# Patient Record
Sex: Female | Born: 1950 | Race: White | Hispanic: No | Marital: Married | Smoking: Former smoker
Health system: Southern US, Community
[De-identification: ages and names within clinical notes are randomized; demographics above are authoritative.]

## PROBLEM LIST (undated history)

## (undated) DIAGNOSIS — F329 Major depressive disorder, single episode, unspecified: Secondary | ICD-10-CM

## (undated) DIAGNOSIS — E039 Hypothyroidism, unspecified: Secondary | ICD-10-CM

## (undated) DIAGNOSIS — J3089 Other allergic rhinitis: Secondary | ICD-10-CM

## (undated) DIAGNOSIS — K219 Gastro-esophageal reflux disease without esophagitis: Secondary | ICD-10-CM

## (undated) DIAGNOSIS — E559 Vitamin D deficiency, unspecified: Secondary | ICD-10-CM

## (undated) DIAGNOSIS — F32A Depression, unspecified: Secondary | ICD-10-CM

## (undated) DIAGNOSIS — F419 Anxiety disorder, unspecified: Secondary | ICD-10-CM

## (undated) DIAGNOSIS — B009 Herpesviral infection, unspecified: Secondary | ICD-10-CM

## (undated) DIAGNOSIS — A64 Unspecified sexually transmitted disease: Secondary | ICD-10-CM

## (undated) DIAGNOSIS — M8580 Other specified disorders of bone density and structure, unspecified site: Secondary | ICD-10-CM

## (undated) HISTORY — DX: Other allergic rhinitis: J30.89

## (undated) HISTORY — DX: Herpesviral infection, unspecified: B00.9

## (undated) HISTORY — PX: WISDOM TOOTH EXTRACTION: SHX21

## (undated) HISTORY — DX: Other specified disorders of bone density and structure, unspecified site: M85.80

## (undated) HISTORY — PX: AUGMENTATION MAMMAPLASTY: SUR837

## (undated) HISTORY — DX: Depression, unspecified: F32.A

## (undated) HISTORY — DX: Anxiety disorder, unspecified: F41.9

## (undated) HISTORY — DX: Hypothyroidism, unspecified: E03.9

## (undated) HISTORY — DX: Gastro-esophageal reflux disease without esophagitis: K21.9

## (undated) HISTORY — DX: Vitamin D deficiency, unspecified: E55.9

## (undated) HISTORY — DX: Unspecified sexually transmitted disease: A64

## (undated) HISTORY — DX: Major depressive disorder, single episode, unspecified: F32.9

## (undated) HISTORY — PX: COLONOSCOPY: SHX174

---

## 1994-03-06 HISTORY — PX: BREAST ENHANCEMENT SURGERY: SHX7

## 1997-10-22 ENCOUNTER — Other Ambulatory Visit: Admission: RE | Admit: 1997-10-22 | Discharge: 1997-10-22 | Payer: Self-pay | Admitting: Obstetrics and Gynecology

## 1999-04-29 ENCOUNTER — Other Ambulatory Visit: Admission: RE | Admit: 1999-04-29 | Discharge: 1999-04-29 | Payer: Self-pay | Admitting: Obstetrics and Gynecology

## 2000-07-06 ENCOUNTER — Other Ambulatory Visit: Admission: RE | Admit: 2000-07-06 | Discharge: 2000-07-06 | Payer: Self-pay | Admitting: Obstetrics and Gynecology

## 2001-09-11 ENCOUNTER — Other Ambulatory Visit: Admission: RE | Admit: 2001-09-11 | Discharge: 2001-09-11 | Payer: Self-pay | Admitting: Obstetrics and Gynecology

## 2002-07-28 ENCOUNTER — Encounter: Admission: RE | Admit: 2002-07-28 | Discharge: 2002-07-28 | Payer: Self-pay | Admitting: Internal Medicine

## 2002-07-28 ENCOUNTER — Encounter: Payer: Self-pay | Admitting: Internal Medicine

## 2002-11-25 ENCOUNTER — Other Ambulatory Visit: Admission: RE | Admit: 2002-11-25 | Discharge: 2002-11-25 | Payer: Self-pay | Admitting: Obstetrics and Gynecology

## 2004-06-28 ENCOUNTER — Other Ambulatory Visit: Admission: RE | Admit: 2004-06-28 | Discharge: 2004-06-28 | Payer: Self-pay | Admitting: Obstetrics and Gynecology

## 2004-10-12 ENCOUNTER — Ambulatory Visit: Payer: Self-pay | Admitting: Internal Medicine

## 2005-02-06 ENCOUNTER — Ambulatory Visit: Payer: Self-pay | Admitting: Internal Medicine

## 2005-07-11 ENCOUNTER — Ambulatory Visit: Payer: Self-pay | Admitting: Internal Medicine

## 2005-07-20 ENCOUNTER — Ambulatory Visit: Payer: Self-pay | Admitting: Internal Medicine

## 2005-07-20 LAB — HM COLONOSCOPY

## 2005-08-03 ENCOUNTER — Other Ambulatory Visit: Admission: RE | Admit: 2005-08-03 | Discharge: 2005-08-03 | Payer: Self-pay | Admitting: Obstetrics & Gynecology

## 2006-08-01 ENCOUNTER — Encounter: Payer: Self-pay | Admitting: Internal Medicine

## 2006-09-04 ENCOUNTER — Other Ambulatory Visit: Admission: RE | Admit: 2006-09-04 | Discharge: 2006-09-04 | Payer: Self-pay | Admitting: Obstetrics & Gynecology

## 2007-01-29 ENCOUNTER — Encounter: Payer: Self-pay | Admitting: Internal Medicine

## 2007-02-15 ENCOUNTER — Encounter: Payer: Self-pay | Admitting: Internal Medicine

## 2007-06-28 ENCOUNTER — Encounter: Payer: Self-pay | Admitting: Internal Medicine

## 2007-10-16 ENCOUNTER — Other Ambulatory Visit: Admission: RE | Admit: 2007-10-16 | Discharge: 2007-10-16 | Payer: Self-pay | Admitting: Obstetrics & Gynecology

## 2007-12-11 ENCOUNTER — Encounter: Payer: Self-pay | Admitting: Internal Medicine

## 2008-06-08 ENCOUNTER — Encounter: Payer: Self-pay | Admitting: Internal Medicine

## 2008-11-17 ENCOUNTER — Encounter: Payer: Self-pay | Admitting: Internal Medicine

## 2008-11-19 ENCOUNTER — Ambulatory Visit: Payer: Self-pay | Admitting: Vascular Surgery

## 2008-11-28 ENCOUNTER — Observation Stay (HOSPITAL_COMMUNITY): Admission: EM | Admit: 2008-11-28 | Discharge: 2008-11-29 | Payer: Self-pay | Admitting: Emergency Medicine

## 2008-11-28 ENCOUNTER — Encounter (INDEPENDENT_AMBULATORY_CARE_PROVIDER_SITE_OTHER): Payer: Self-pay | Admitting: General Surgery

## 2009-02-24 ENCOUNTER — Encounter: Payer: Self-pay | Admitting: Internal Medicine

## 2009-03-06 HISTORY — PX: APPENDECTOMY: SHX54

## 2009-08-18 ENCOUNTER — Encounter: Payer: Self-pay | Admitting: Internal Medicine

## 2009-11-30 ENCOUNTER — Ambulatory Visit: Payer: Self-pay | Admitting: Internal Medicine

## 2009-11-30 DIAGNOSIS — E039 Hypothyroidism, unspecified: Secondary | ICD-10-CM

## 2009-11-30 DIAGNOSIS — M8580 Other specified disorders of bone density and structure, unspecified site: Secondary | ICD-10-CM

## 2009-11-30 DIAGNOSIS — J309 Allergic rhinitis, unspecified: Secondary | ICD-10-CM

## 2009-11-30 DIAGNOSIS — E559 Vitamin D deficiency, unspecified: Secondary | ICD-10-CM | POA: Insufficient documentation

## 2009-11-30 LAB — CONVERTED CEMR LAB
ALT: 16 units/L (ref 0–35)
BUN: 17 mg/dL (ref 6–23)
Bilirubin, Direct: 0.2 mg/dL (ref 0.0–0.3)
CO2: 28 meq/L (ref 19–32)
Chloride: 108 meq/L (ref 96–112)
Cholesterol: 195 mg/dL (ref 0–200)
Creatinine, Ser: 0.9 mg/dL (ref 0.4–1.2)
Eosinophils Absolute: 0.1 10*3/uL (ref 0.0–0.7)
Glucose, Bld: 82 mg/dL (ref 70–99)
HCT: 40.7 % (ref 36.0–46.0)
LDL Cholesterol: 113 mg/dL — ABNORMAL HIGH (ref 0–99)
Lymphs Abs: 1.8 10*3/uL (ref 0.7–4.0)
MCHC: 34.8 g/dL (ref 30.0–36.0)
MCV: 92 fL (ref 78.0–100.0)
Monocytes Absolute: 0.4 10*3/uL (ref 0.1–1.0)
Neutrophils Relative %: 52.7 % (ref 43.0–77.0)
Platelets: 224 10*3/uL (ref 150.0–400.0)
Potassium: 4.2 meq/L (ref 3.5–5.1)
RDW: 12.4 % (ref 11.5–14.6)
TSH: 2.23 microintl units/mL (ref 0.35–5.50)
Total Bilirubin: 1.5 mg/dL — ABNORMAL HIGH (ref 0.3–1.2)
Total Protein: 7.3 g/dL (ref 6.0–8.3)
Triglycerides: 50 mg/dL (ref 0.0–149.0)

## 2009-12-01 LAB — CONVERTED CEMR LAB: Vit D, 25-Hydroxy: 44 ng/mL (ref 30–89)

## 2010-02-07 ENCOUNTER — Encounter: Payer: Self-pay | Admitting: Internal Medicine

## 2010-02-16 ENCOUNTER — Encounter: Payer: Self-pay | Admitting: Internal Medicine

## 2010-03-08 ENCOUNTER — Encounter: Payer: Self-pay | Admitting: Internal Medicine

## 2010-04-05 NOTE — Assessment & Plan Note (Signed)
Summary: cpx//pt will be fasting//lch   Vital Signs:  Patient profile:   60 year old female Height:      66.25 inches Weight:      128.8 pounds BMI:     20.71 Temp:     98.1 degrees F oral Pulse rate:   64 / minute Resp:     14 per minute BP sitting:   100 / 64  (left arm) Cuff size:   regular  Vitals Entered By: Shonna Chock CMA (November 30, 2009 9:40 AM)  Comments Patient will hold off on vaccine/immunization update due to current allergies   History of Present Illness: Rachel Knight is here for a physical; she is asymptomatic except for extrinsic rhintis for which she took Allergy shots from Dr Gary Callas.  Preventive Screening-Counseling & Management  Alcohol-Tobacco     Smoking Status: quit  Caffeine-Diet-Exercise     Does Patient Exercise: yes  Current Medications (verified): 1)  Multivitamins  Tabs (Multiple Vitamin) .Marland Kitchen.. 1 By Mouth Once Daily 2)  Allerex-Df .... 1/2 By Mouth As Needed 3)  Calcium 800-1000mg  .... Daily 4)  Vitamin D (Ergocalciferol) 50000 Unit Caps (Ergocalciferol) .... 2 X Monthly 5)  Levoxyl 75 Mcg Tabs (Levothyroxine Sodium) .Marland Kitchen.. 1 By Mouth Once Daily 6)  Omnaris 50 Mcg/act Susp (Ciclesonide) .... As Needed  Allergies (verified): No Known Drug Allergies  Past History:  Past Medical History: Vitamin D deficiency , Dr Leda Quail ,Gyn Allergic rhinitis Hypothyroidism Post Menopausal Osteopenia Vasovagal Syncope, PMH of  Past Surgical History: Breast Implants 1996; G 2 P 2; Colonoscopy 2000, 2007 : negative , Dr Juanda Chance( due for repeat 2017) Appendectomy 2010  Family History: Father: CVA, colon polyps;PGF : colon cancer Mother: breast cancer(local) Siblings: bro: CVA @ 85;   Social History: Occupation:Artist Married Former Smoker: < 1 year in teens Alcohol use-yes: socially Regular exercise-yes: walking, gym  5X/week Smoking Status:  quit Does Patient Exercise:  yes  Review of Systems  The patient denies anorexia, fever,  vision loss, decreased hearing, hoarseness, chest pain, syncope, dyspnea on exertion, peripheral edema, prolonged cough, headaches, abdominal pain, melena, hematochezia, severe indigestion/heartburn, hematuria, suspicious skin lesions, depression, unusual weight change, abnormal bleeding, enlarged lymph nodes, and angioedema.         Weight up 5# post menopause. ENT:  Complains of nasal congestion; denies postnasal drainage. Allergy:  Denies hives or rash and itching eyes; Perennial symptoms.  Physical Exam  General:  Thin,well-nourished; alert,appropriate and cooperative throughout examination Head:  Normocephalic and atraumatic without obvious abnormalities.  Eyes:  No corneal or conjunctival inflammation noted. Perrla. Funduscopic exam benign, without hemorrhages, exudates or papilledema.  Ears:  External ear exam shows no significant lesions or deformities.  Otoscopic examination reveals clear canals, tympanic membranes are intact bilaterally without bulging, retraction, inflammation or discharge. Hearing is grossly normal bilaterally. Nose:  External nasal examination shows no deformity or inflammation. Nasal mucosa are pink and moist without lesions or exudates. Mouth:  Oral mucosa and oropharynx without lesions or exudates.  Teeth in good repair. Neck:  No deformities, masses, or tenderness noted. Lungs:  Normal respiratory effort, chest expands symmetrically. Lungs are clear to auscultation, no crackles or wheezes. Heart:  regular rhythm, no murmur, no gallop, no rub, no JVD, no HJR, and bradycardia.   Abdomen:  Bowel sounds positive,abdomen soft and non-tender without masses, organomegaly or hernias noted. Faint aortic bruit w/o AAA Genitalia:  Dr Hyacinth Meeker Msk:  No deformity or scoliosis noted of thoracic or lumbar spine.  Pulses:  R and L carotid,radial,dorsalis pedis and posterior tibial pulses are full and equal bilaterally Extremities:  No clubbing, cyanosis, edema, or deformity  noted with normal full range of motion of all joints.   Neurologic:  alert & oriented X3 and DTRs symmetrical and normal.   Skin:  Intact without suspicious lesions or rashes Cervical Nodes:  No lymphadenopathy noted Axillary Nodes:  No palpable lymphadenopathy Psych:  memory intact for recent and remote, normally interactive, and good eye contact.     Impression & Recommendations:  Problem # 1:  ROUTINE GENERAL MEDICAL EXAM@HEALTH  CARE FACL (ICD-V70.0)  Orders: EKG w/ Interpretation (93000) Venipuncture (47425) TLB-Lipid Panel (80061-LIPID) TLB-BMP (Basic Metabolic Panel-BMET) (80048-METABOL) TLB-CBC Platelet - w/Differential (85025-CBCD) TLB-Hepatic/Liver Function Pnl (80076-HEPATIC) TLB-TSH (Thyroid Stimulating Hormone) (84443-TSH) T-Vitamin D (25-Hydroxy) (95638-75643) Specimen Handling (32951)  Problem # 2:  HYPOTHYROIDISM (ICD-244.9)  Her updated medication list for this problem includes:    Levoxyl 75 Mcg Tabs (Levothyroxine sodium) .Marland Kitchen... 1 by mouth once daily  Problem # 3:  OSTEOPENIA (ICD-733.90)  Problem # 4:  VITAMIN D DEFICIENCY (ICD-268.9)  Problem # 5:  ALLERGIC RHINITIS (ICD-477.9)  Her updated medication list for this problem includes:    Omnaris 50 Mcg/act Susp (Ciclesonide) .Marland Kitchen... As needed  Complete Medication List: 1)  Multivitamins Tabs (Multiple vitamin) .Marland Kitchen.. 1 by mouth once daily 2)  Allerex-df  .... 1/2 by mouth as needed 3)  Calcium 800-1000mg   .... Daily 4)  Vitamin D (ergocalciferol) 50000 Unit Caps (Ergocalciferol) .... 2 x monthly 5)  Levoxyl 75 Mcg Tabs (Levothyroxine sodium) .Marland Kitchen.. 1 by mouth once daily 6)  Omnaris 50 Mcg/act Susp (Ciclesonide) .... As needed  Patient Instructions: 1)  Neti pot once daily as needed for nasal congestion followed by Omnaris.

## 2010-04-05 NOTE — Letter (Signed)
[facility]   Imported By: Lennie Odor 08/31/2009 14:38:04  _____________________________________________________________________  External Attachment:    Type:   Image     Comment:   External Document

## 2010-04-05 NOTE — Letter (Signed)
Summary: [facility]   All[facility] & [facility]   Imported By: Lanelle Bal 04/05/2009 11:43:46  _____________________________________________________________________  External Attachment:    Type:   Image     Comment:   External Document

## 2010-04-07 NOTE — Miscellaneous (Signed)
Summary: [facility]  [facility]   Imported By: Lanelle Bal 02/14/2010 09:38:51  _____________________________________________________________________  External Attachment:    Type:   Image     Comment:   External Document

## 2010-06-10 LAB — DIFFERENTIAL
Basophils Absolute: 0 10*3/uL (ref 0.0–0.1)
Basophils Relative: 0 % (ref 0–1)
Eosinophils Absolute: 0 10*3/uL (ref 0.0–0.7)
Eosinophils Relative: 0 % (ref 0–5)

## 2010-06-10 LAB — COMPREHENSIVE METABOLIC PANEL
ALT: 15 U/L (ref 0–35)
AST: 20 U/L (ref 0–37)
Alkaline Phosphatase: 50 U/L (ref 39–117)
CO2: 20 mEq/L (ref 19–32)
Chloride: 106 mEq/L (ref 96–112)
Creatinine, Ser: 0.93 mg/dL (ref 0.4–1.2)
GFR calc Af Amer: >60 mL/min (ref >60)
GFR calc non Af Amer: >60 mL/min (ref >60)
Potassium: 3.3 mEq/L — ABNORMAL LOW (ref 3.5–5.1)
Sodium: 136 mEq/L (ref 135–145)
Total Bilirubin: 1.2 mg/dL (ref 0.3–1.2)

## 2010-06-10 LAB — CBC
MCV: 92.1 fL (ref 78.0–100.0)
RBC: 4.22 MIL/uL (ref 3.87–5.11)
WBC: 14.4 10*3/uL — ABNORMAL HIGH (ref 4.0–10.5)

## 2010-06-10 LAB — URINE MICROSCOPIC-ADD ON

## 2010-06-10 LAB — URINALYSIS, ROUTINE W REFLEX MICROSCOPIC
Glucose, UA: NEGATIVE mg/dL
Leukocytes, UA: NEGATIVE
Protein, ur: NEGATIVE mg/dL
Specific Gravity, Urine: 1.03 (ref 1.005–1.030)
Urobilinogen, UA: 0.2 mg/dL (ref 0.0–1.0)

## 2010-06-10 LAB — LIPASE, BLOOD: Lipase: 42 U/L (ref 11–59)

## 2011-01-05 LAB — HM PAP SMEAR

## 2011-02-15 ENCOUNTER — Encounter: Payer: Self-pay | Admitting: Family

## 2011-02-15 ENCOUNTER — Ambulatory Visit (INDEPENDENT_AMBULATORY_CARE_PROVIDER_SITE_OTHER): Payer: 59 | Admitting: Family

## 2011-02-15 VITALS — BP 100/72 | HR 72 | Temp 98.0°F | Resp 16 | Wt 126.0 lb

## 2011-02-15 DIAGNOSIS — R05 Cough: Secondary | ICD-10-CM

## 2011-02-15 DIAGNOSIS — J069 Acute upper respiratory infection, unspecified: Secondary | ICD-10-CM | POA: Insufficient documentation

## 2011-02-15 NOTE — Progress Notes (Signed)
  Subjective:    Patient ID: Rachel Knight, female    DOB: 04/19/1950, 60 y.o.   MRN: [id number]  HPI  Rachel Knight is a 59 yr old female who presents today with chief complaint of low grade temp x3 days Tmax is 99.9.  Symptoms started "like a cold" on Sunday night.  Gradually worsening, "horrible HA" and mucous coming out of my nose.  Coughing up phlegm since yesterday which is clear.  Symptoms are worsening. She has tried alkaselzer day/night, sudafed, and benadryl/advil without significant improvement.  She had a flu shot 2 weeks ago.      Review of Systems Pmhx is significant for hypothyroidism, depression, osteopenia and vitamin D deficiency.    Objective:   Physical Exam  Constitutional: She is oriented to person, place, and time. She appears well-developed and well-nourished. No distress.  HENT:  Right Ear: Tympanic membrane and ear canal normal.  Left Ear: Tympanic membrane and ear canal normal.  Mouth/Throat: No posterior oropharyngeal edema or posterior oropharyngeal erythema.  Eyes: Conjunctivae are normal. No scleral icterus.  Cardiovascular: Normal rate and regular rhythm.   No murmur heard. Pulmonary/Chest: Effort normal and breath sounds normal. No respiratory distress. She has no wheezes. She has no rales. She exhibits no tenderness.  Musculoskeletal: She exhibits no edema.  Neurological: She is alert and oriented to person, place, and time.  Skin: Skin is warm and dry. No rash noted. No erythema. No pallor.  Psychiatric: She has a normal mood and affect. Her behavior is normal. Judgment and thought content normal.          Assessment & Plan:

## 2011-02-15 NOTE — Assessment & Plan Note (Addendum)
Rapid flu is negative.  Will plan to treat conservatively.  Recommended prn motrin/sudafed.  Call if symptoms are not improved in 3 days.

## 2011-02-15 NOTE — Patient Instructions (Signed)

## 2011-02-16 ENCOUNTER — Ambulatory Visit: Payer: Self-pay | Admitting: Internal Medicine

## 2011-03-14 ENCOUNTER — Encounter: Payer: Self-pay | Admitting: Internal Medicine

## 2011-03-15 ENCOUNTER — Ambulatory Visit (INDEPENDENT_AMBULATORY_CARE_PROVIDER_SITE_OTHER): Payer: 59 | Admitting: Internal Medicine

## 2011-03-15 ENCOUNTER — Encounter: Payer: Self-pay | Admitting: Internal Medicine

## 2011-03-15 VITALS — BP 104/68 | HR 66 | Temp 98.2°F | Resp 12 | Ht 66.25 in | Wt 125.4 lb

## 2011-03-15 DIAGNOSIS — M899 Disorder of bone, unspecified: Secondary | ICD-10-CM

## 2011-03-15 DIAGNOSIS — E559 Vitamin D deficiency, unspecified: Secondary | ICD-10-CM

## 2011-03-15 DIAGNOSIS — Z0000 Encounter for general adult medical examination without abnormal findings: Secondary | ICD-10-CM

## 2011-03-15 DIAGNOSIS — E039 Hypothyroidism, unspecified: Secondary | ICD-10-CM

## 2011-03-15 DIAGNOSIS — M949 Disorder of cartilage, unspecified: Secondary | ICD-10-CM

## 2011-03-15 LAB — CBC WITH DIFFERENTIAL/PLATELET
Basophils Absolute: 0 10*3/uL (ref 0.0–0.1)
Eosinophils Relative: 2.1 % (ref 0.0–5.0)
Lymphocytes Relative: 36.3 % (ref 12.0–46.0)
Monocytes Relative: 8.2 % (ref 3.0–12.0)
Platelets: 216 10*3/uL (ref 150.0–400.0)
RDW: 12.7 % (ref 11.5–14.6)
WBC: 5.1 10*3/uL (ref 4.5–10.5)

## 2011-03-15 LAB — TSH: TSH: 1.65 u[IU]/mL (ref 0.35–5.50)

## 2011-03-15 LAB — BASIC METABOLIC PANEL
BUN: 21 mg/dL (ref 6–23)
Calcium: 9.7 mg/dL (ref 8.4–10.5)
GFR: 91.14 mL/min (ref >60.00)
Glucose, Bld: 83 mg/dL (ref 70–99)
Sodium: 142 mEq/L (ref 135–145)

## 2011-03-15 LAB — HEPATIC FUNCTION PANEL
ALT: 21 U/L (ref 0–35)
Albumin: 4.5 g/dL (ref 3.5–5.2)
Bilirubin, Direct: 0.1 mg/dL (ref 0.0–0.3)
Total Protein: 7.1 g/dL (ref 6.0–8.3)

## 2011-03-15 LAB — LIPID PANEL
Cholesterol: 179 mg/dL (ref 0–200)
HDL: 77 mg/dL (ref >39.00)
Triglycerides: 37 mg/dL (ref 0.0–149.0)
VLDL: 7.4 mg/dL (ref 0.0–40.0)

## 2011-03-15 MED ORDER — MONTELUKAST SODIUM 10 MG PO TABS: 10.0000 mg | ORAL_TABLET | Freq: Every day | ORAL | Status: DC

## 2011-03-15 MED ORDER — FLUTICASONE PROPIONATE 50 MCG/ACT NA SUSP: 1.0000 | Freq: Two times a day (BID) | NASAL | Status: DC | PRN

## 2011-03-15 NOTE — Progress Notes (Signed)
Subjective:    Patient ID: Rachel Knight, female    DOB: October 29, 1950, 61 y.o.   MRN: [id number]  HPI  Leotis Shames  is here for a physical;acute issues perennial allergies. Allergy shots actually made symptoms worse. Symptoms improve when she does not drink alcohol. She does plan a trial of acupuncture for her allergies.       Review of Systems Patient reports no significant  vision/ hearing  changes, adenopathy,fever, weight change,  persistant / recurrent hoarseness , swallowing issues, chest pain,palpitations,edema,persistant /recurrent cough, hemoptysis, dyspnea( rest/ exertional/paroxysmal nocturnal), gastrointestinal bleeding(melena, rectal bleeding), abdominal pain, significant heartburn,  bowel changes,GU symptoms(dysuria, hematuria,pyuria, incontinence), Gyn symptoms(abnormal  bleeding , pain),  syncope, focal weakness, memory loss,numbness & tingling, skin/hair /nail changes,abnormal bruising or bleeding, anxiety,or depression.  She is seeing a chiropractor with good response for some low back pain, hip pain and medial thigh strain.  She has had a "freckle" of the right iris; this is monitored annually.  She'll be seeing the dermatologist to assess her multiple nevi.  She only has palpitations when she takes cold preparation; she has used Sudafed for rhinitis symptoms.      Objective:   Physical Exam Gen.: Thin but healthy and well-nourished in appearance. Alert, appropriate and cooperative throughout exam. Head: Normocephalic without obvious abnormalities  Eyes: No corneal or conjunctival inflammation noted. Pupils equal round reactive to light and accommodation. Fundal exam is benign without hemorrhages, exudate, papilledema. Extraocular motion intact. Vision grossly normal. Hyperpigmentation of the knee medial right pupil area Ears: External  ear exam reveals no significant lesions or deformities. Canals clear .TMs normal. Hearing is grossly normal bilaterally. Nose: External nasal  exam reveals no deformity or inflammation. Nasal mucosa are pink and moist. No lesions or exudates noted.  Mouth: Oral mucosa and oropharynx reveal no lesions or exudates. Teeth in good repair. Neck: No deformities, masses, or tenderness noted. Range of motion & Thyroid normal. Possible cervical rib on the left. Lungs: Normal respiratory effort; chest expands symmetrically. Lungs are clear to auscultation without rales, wheezes, or increased work of breathing. Heart: Normal rate and rhythm. Normal S1 and S2. No gallop, or rub. Possible subtle click at the apex without regurgitation or murmur. Abdomen: Bowel sounds normal; abdomen soft and nontender. No masses, organomegaly or hernias noted. Genitalia:Dr Hyacinth Meeker   .                                                                                   Musculoskeletal/extremities: No deformity or scoliosis noted of  the thoracic or lumbar spine. No clubbing, cyanosis, edema, or deformity noted. Range of motion  normal .Tone & strength  normal.Joints normal. Nail health  good. Vascular: Carotid, radial artery, dorsalis pedis and  posterior tibial pulses are full and equal. No bruits present. Neurologic: Alert and oriented x3. Deep tendon reflexes symmetrical and normal.          Skin: Intact without suspicious lesions or rashes. Lymph: No cervical, axillary lymphadenopathy present. Psych: Mood and affect are normal. Normally interactive  Assessment & Plan:  #1 comprehensive physical exam; no acute findings #2 see Problem List with Assessments & Recommendations Plan: see Orders

## 2011-03-15 NOTE — Assessment & Plan Note (Signed)
Bone mineral density studies are performed at [facility])

## 2011-03-15 NOTE — Assessment & Plan Note (Signed)
Thyroid supplementation as prescribed by Dr. Hyacinth Meeker.

## 2011-03-15 NOTE — Assessment & Plan Note (Signed)
Vitamin D level is monitored by Dr. Hyacinth Meeker.

## 2011-03-15 NOTE — Patient Instructions (Signed)
Preventive Health Care: Exercise  30-45  minutes a day, 3-4 days a week. Walking is especially valuable in preventing Osteoporosis. Eat a low-fat diet with lots of fruits and vegetables, up to 7-9 servings per day.Consume less than 30 grams of sugar per day from foods & drinks with High Fructose Corn Syrup as # 1,2,3 or #4 on label.  Normal T scores on a bone density exam (BMD)  are +1 to -1. Osteopenia would be -1.1 to -2.4. Osteoporosis is defined by a  T score worse than -2.4. Treatment should be considered  with  T scores worse than -1.5, particularly if there is  family history of low bone density or personal  past history of atypical fractue.BMD should be monitored every 25 months. Recommended lifestyle interventions to prevent  Osteoporosis include calcium 600 mg twice a day  & vitamin D3 supplementation to keep vitamin  D  level @ least 40-60. The usual vitamin D3 dose is 1000 IU daily; but individual dose is determined by annual vitamin D level monitor.   As per the Standard of Care , screening Colonoscopy recommended @ 50 & every 5-10 years thereafter . More frequent monitor would be dictated by family history or findings @ Colonoscopy

## 2011-03-16 LAB — VITAMIN D 25 HYDROXY (VIT D DEFICIENCY, FRACTURES): Vit D, 25-Hydroxy: 43 ng/mL (ref 30–89)

## 2011-03-21 LAB — HM DEXA SCAN

## 2011-03-28 ENCOUNTER — Encounter: Payer: Self-pay | Admitting: Internal Medicine

## 2011-04-05 ENCOUNTER — Encounter: Payer: Self-pay | Admitting: Internal Medicine

## 2011-05-05 ENCOUNTER — Other Ambulatory Visit: Payer: Self-pay | Admitting: Orthopedic Surgery

## 2011-05-05 DIAGNOSIS — M25559 Pain in unspecified hip: Secondary | ICD-10-CM

## 2011-05-15 ENCOUNTER — Ambulatory Visit
Admission: RE | Admit: 2011-05-15 | Discharge: 2011-05-15 | Disposition: A | Discharge status: Home / Self Care | Payer: 59 | Source: Ambulatory Visit | Attending: Orthopedic Surgery | Admitting: Orthopedic Surgery

## 2011-05-15 DIAGNOSIS — M25559 Pain in unspecified hip: Secondary | ICD-10-CM

## 2011-05-15 MED ORDER — IOHEXOL 180 MG/ML  SOLN
15.0000 mL | Freq: Once | INTRAMUSCULAR | Status: AC | PRN
  Administered 2011-05-15: 15 mL via INTRA_ARTICULAR

## 2011-06-07 ENCOUNTER — Other Ambulatory Visit: Payer: Self-pay | Admitting: Dermatology

## 2011-06-17 ENCOUNTER — Emergency Department (HOSPITAL_COMMUNITY)
Admission: EM | Admit: 2011-06-17 | Discharge: 2011-06-17 | Disposition: A | Discharge status: Home / Self Care | Payer: 59 | Attending: Emergency Medicine | Admitting: Emergency Medicine

## 2011-06-17 ENCOUNTER — Encounter (HOSPITAL_COMMUNITY): Payer: Self-pay

## 2011-06-17 DIAGNOSIS — F411 Generalized anxiety disorder: Secondary | ICD-10-CM | POA: Insufficient documentation

## 2011-06-17 DIAGNOSIS — F419 Anxiety disorder, unspecified: Secondary | ICD-10-CM

## 2011-06-17 DIAGNOSIS — R002 Palpitations: Secondary | ICD-10-CM | POA: Insufficient documentation

## 2011-06-17 DIAGNOSIS — I498 Other specified cardiac arrhythmias: Secondary | ICD-10-CM | POA: Insufficient documentation

## 2011-06-17 LAB — BASIC METABOLIC PANEL
CO2: 23 mEq/L (ref 19–32)
Calcium: 9.4 mg/dL (ref 8.4–10.5)
Creatinine, Ser: 0.84 mg/dL (ref 0.50–1.10)
GFR calc non Af Amer: 75 mL/min — ABNORMAL LOW (ref >90)

## 2011-06-17 LAB — TSH: TSH: 2.942 u[IU]/mL (ref 0.350–4.500)

## 2011-06-17 MED ORDER — ALPRAZOLAM 0.5 MG PO TABS
0.5000 mg | ORAL_TABLET | Freq: Once | ORAL | Status: AC
  Administered 2011-06-17: 0.5 mg via ORAL
  Filled 2011-06-17: qty 1

## 2011-06-17 MED ORDER — ALPRAZOLAM 0.5 MG PO TABS: 0.5000 mg | ORAL_TABLET | Freq: Three times a day (TID) | ORAL | Status: DC | PRN

## 2011-06-17 NOTE — Discharge Instructions (Signed)
Follow up with primary care doctor in coming week. Avoid caffeine. You may take xanax as need for anxiety - no driving for the next 6 hours or when taking xanax. The lab work that has resulted is normal. Your thyroid tests remain pending, and will be resulted tomorrow - have your doctor follow up on those results. Return to ER if wore, persistent fast heart beat, fast pulse rate, faint, severe anxiety, other concern.     Anxiety and Panic Attacks Your caregiver has informed you that you are having an anxiety or panic attack. There may be many forms of this. Most of the time these attacks come suddenly and without warning. They come at any time of day, including periods of sleep, and at any time of life. They may be strong and unexplained. Although panic attacks are very scary, they are physically harmless. Sometimes the cause of your anxiety is not known. Anxiety is a protective mechanism of the body in its fight or flight mechanism. Most of these perceived danger situations are actually nonphysical situations (such as anxiety over losing a job). CAUSES  The causes of an anxiety or panic attack are many. Panic attacks may occur in otherwise healthy people given a certain set of circumstances. There may be a genetic cause for panic attacks. Some medications may also have anxiety as a side effect. SYMPTOMS  Some of the most common feelings are:  Intense terror.   Dizziness, feeling faint.   Hot and cold flashes.   Fear of going crazy.   Feelings that nothing is real.   Sweating.   Shaking.   Chest pain or a fast heartbeat (palpitations).   Smothering, choking sensations.   Feelings of impending doom and that death is near.   Tingling of extremities, this may be from over-breathing.   Altered reality (derealization).   Being detached from yourself (depersonalization).  Several symptoms can be present to make up anxiety or panic attacks. DIAGNOSIS  The evaluation by your caregiver  will depend on the type of symptoms you are experiencing. The diagnosis of anxiety or panic attack is made when no physical illness can be determined to be a cause of the symptoms. TREATMENT  Treatment to prevent anxiety and panic attacks may include:  Avoidance of circumstances that cause anxiety.   Reassurance and relaxation.   Regular exercise.   Relaxation therapies, such as yoga.   Psychotherapy with a psychiatrist or therapist.   Avoidance of caffeine, alcohol and illegal drugs.   Prescribed medication.  SEEK IMMEDIATE MEDICAL CARE IF:   You experience panic attack symptoms that are different than your usual symptoms.   You have any worsening or concerning symptoms.  Document Released: 02/20/2005 Document Revised: 02/09/2011 Document Reviewed: 06/24/2009 [facility] Patient Information 2012 [place], [place].     Palpitations  A palpitation is the feeling that your heartbeat is irregular or is faster than normal. Although this is frightening, it usually is not serious. Palpitations may be caused by excesses of smoking, caffeine, or alcohol. They are also brought on by stress and anxiety. Sometimes, they are caused by heart disease. Unless otherwise noted, your caregiver did not find any signs of serious illness at this time. HOME CARE INSTRUCTIONS  To help prevent palpitations:  Drink decaffeinated coffee, tea, and soda pop. Avoid chocolate.   If you smoke or drink alcohol, quit or cut down as much as possible.   Reduce your stress or anxiety level. Biofeedback, yoga, or meditation will help you relax. Physical  activity such as swimming, jogging, or walking also may be helpful.  SEEK MEDICAL CARE IF:   You continue to have a fast heartbeat.   Your palpitations occur more often.  SEEK IMMEDIATE MEDICAL CARE IF: You develop chest pain, shortness of breath, severe headache, dizziness, or fainting. Document Released: 02/18/2000 Document Revised: 02/09/2011 Document Reviewed:  04/19/2007 [facility] Patient Information 2012 [place], [place].

## 2011-06-17 NOTE — ED Notes (Signed)
Pt in from home via ems with anxiety states out of medication denies s/i denies h/i

## 2011-06-17 NOTE — ED Provider Notes (Signed)
History     CSN: [id number]  Arrival date & time 06/17/11  1431   First MD Initiated Contact with Patient 06/17/11 1503      Chief Complaint  Patient presents with  . Anxiety    (Consider location/radiation/quality/duration/timing/severity/associated sxs/prior treatment) Patient is a 61 y.o. female presenting with anxiety.  Anxiety Pertinent negatives include no chest pain, no headaches and no shortness of breath.  pt states has hx anxiety, states in past couple weeks increased feelings of anxiety. States lots of recent stressors involving work and family issues. States feels as if palpitations, racing heart, anxiety, anxiousness. Denies hx dysrhythmia or cardiac disease. No chest pain. No unusual doe or fatigue. No faintness or dizziness. Denies numbness/weakness. Denies heavy caffeine use or any energy drink use. Has been on same dose levoxyl for years, hx hypothyroid. No heat intol, unusual sweats or recent wt loss. Denies depression or thoughts of self harm.   Past Medical History  Diagnosis Date  . Perennial allergic rhinitis     Past Surgical History  Procedure Date  . Breast enhancement surgery 1996  . G 2 p 2   . Colonoscopy 2000     Dr Juanda Chance  . Appendectomy 2011    Family History  Problem Relation Age of Onset  . Stroke Father 24  . Stroke Brother 45  . Cancer Maternal Grandmother     colon  . Cancer Maternal Grandfather     colon  . Colon polyps Father   . Coronary artery disease Maternal Grandfather 60  . Coronary artery disease Maternal Grandmother 63    History  Substance Use Topics  . Smoking status: Never Smoker   . Smokeless tobacco: Never Used  . Alcohol Use: 4.2 oz/week    7 Shots of liquor per week      socially    OB History    Grav Para Term Preterm Abortions TAB SAB Ect Mult Living                  Review of Systems  Constitutional: Negative for fever and chills.  HENT: Negative for neck pain.   Eyes: Negative for visual  disturbance.  Respiratory: Negative for shortness of breath.   Cardiovascular: Positive for palpitations. Negative for chest pain and leg swelling.  Neurological: Negative for syncope, light-headedness and headaches.  Psychiatric/Behavioral: The patient is nervous/anxious.     Allergies  Review of patient's allergies indicates no known allergies.  Home Medications   Current Outpatient Rx  Name Route Sig Dispense Refill  . BEPOTASTINE BESILATE 1.5 % OP SOLN Both Eyes Place 1 drop into both eyes daily as needed. For allergy eye relief    . CALCIUM-VITAMIN D 500-200 MG-UNIT PO TABS Oral Take 1 tablet by mouth 2 (two) times daily with a meal.      . CITALOPRAM HYDROBROMIDE 10 MG PO TABS  Take 1/2 tablet daily.     Marland Kitchen FLUTICASONE PROPIONATE 50 MCG/ACT NA SUSP Nasal Place 1 spray into the nose 2 (two) times daily as needed for rhinitis. 16 g 2  . IBUPROFEN 400 MG PO TABS Oral Take 400 mg by mouth every 6 (six) hours as needed. For pain relief    . IPRATROPIUM BROMIDE 0.06 % NA SOLN Nasal Place 2 sprays into the nose 2 (two) times daily as needed. For allergy related sinus relief    . LEVOTHYROXINE SODIUM 75 MCG PO TABS Oral Take 75 mcg by mouth daily.      Marland Kitchen  MONTELUKAST SODIUM 10 MG PO TABS Oral Take 1 tablet (10 mg total) by mouth at bedtime. 30 tablet 3  . ONE-DAILY MULTI VITAMINS PO TABS Oral Take 1 tablet by mouth daily.      Marland Kitchen ZOLPIDEM TARTRATE 5 MG PO TABS Oral Take 5 mg by mouth at bedtime as needed. For insomnia      BP 105/65  Pulse 61  Temp(Src) 97.6 F (36.4 C) (Oral)  SpO2 99%  LMP 06/17/2011  Physical Exam  Nursing note and vitals reviewed. Constitutional: She is oriented to person, place, and time. She appears well-developed and well-nourished. No distress.  HENT:  Head: Atraumatic.  Eyes: Conjunctivae are normal. No scleral icterus.  Neck: Neck supple. No tracheal deviation present. No thyromegaly present.  Cardiovascular: Normal rate, regular rhythm, normal heart  sounds and intact distal pulses.  Exam reveals no gallop and no friction rub.   No murmur heard. Pulmonary/Chest: Effort normal and breath sounds normal. No respiratory distress.  Abdominal: Soft. Normal appearance. She exhibits no distension. There is no tenderness.  Musculoskeletal: She exhibits no edema and no tenderness.  Neurological: She is alert and oriented to person, place, and time.       Motor intact bil.   Skin: Skin is warm and dry. No rash noted.  Psychiatric:       Anxious. No depressed mood.     ED Course  Procedures (including critical care time)   Labs Reviewed  BASIC METABOLIC PANEL  TSH  T4, FREE    Results for orders placed during the hospital encounter of 06/17/11  BASIC METABOLIC PANEL      Component Value Range   Sodium 138  135 - 145 (mEq/L)   Potassium 4.1  3.5 - 5.1 (mEq/L)   Chloride 106  96 - 112 (mEq/L)   CO2 23  19 - 32 (mEq/L)   Glucose, Bld 93  70 - 99 (mg/dL)   BUN 22  6 - 23 (mg/dL)   Creatinine, Ser 9.52  0.50 - 1.10 (mg/dL)   Calcium 9.4  8.4 - 84.1 (mg/dL)   GFR calc non Af Amer 75 (*) >90 (mL/min)   GFR calc Af Amer 87 (*) >90 (mL/min)      MDM  Labs. Ecg. Xanax po.     Date: 06/17/2011  Rate: 52  Rhythm: sinus bradycardia  QRS Axis: normal  Intervals: normal  ST/T Wave abnormalities: nonspecific ST/T changes  Conduction Disutrbances:none  Narrative Interpretation:   Old EKG Reviewed: none available   No dysrhythmia on monitor.  Recheck pt asymptomatic. No c/o.       Suzi Roots, MD 06/17/11 [phone]

## 2011-06-17 NOTE — ED Notes (Signed)
Pt states she has had a lot of palpitations the last three weeks and is usually able to control her prob but she hasn't been able to at this time.She has been taking Ambien and she thinks that is what it is.

## 2011-06-17 NOTE — ED Notes (Signed)
Pt in via ems with anxiety states feels palpitations states a hx of anxiety attacks states been under a lot of stress lately states onset 3 weeks ago with no relief denies s/i denies h/i

## 2011-06-19 ENCOUNTER — Telehealth: Payer: Self-pay | Admitting: *Deleted

## 2011-06-19 NOTE — Telephone Encounter (Signed)
[facility] Triage Call Report Triage Record Num: [id number] Operator: Lodema Pilot Patient Name: Rachel Knight Call Date & Time: 06/17/2011 1:20:18PM Patient Phone: [phone] PCP: Marga Melnick Patient Gender: Female PCP Fax : [phone] Patient DOB: 1950-07-09 Practice Name: [facility] Reason for Call: Caller: Lauren/Patient; PCP: Marga Melnick; CB#: [phone]; Call regarding Chest Pain/Chest Discomfort; Pt has been under stress for 3 wks. Heart Palpitations x 2 wks. Unable to sleep - started Zolpidem x 3 wks to help sleep. Did not take dose last pm dose after reading side effects. Pt was only able to sleep from 5am-7am. Pt has had Panic Attack x 2 on 06/17/11. Tightness in chest comes and goes. Headache. Constant fluttering. SOB. Abd pain that comes and goes. Last episode of chest tightness w/in the hour; lasting 5 -10 min. Per Irregular HeartBeat Protocol, advised to Activate EMS 911. Care advice given. Protocol(s) Used: Irregular Heartbeat Recommended Outcome per Protocol: Activate EMS 911 Reason for Outcome: Any other cardiac signs/symptoms for more than 5 minutes, now or within last hour. Pain is NOT associated with taking a deep breath or a productive cough, movement, or touch to a localized area on the chest or upper body. Care Advice: ~ Do not give the patient anything to eat or drink. ~ IMMEDIATE ACTION Write down provider's name. List or place the following in a bag for transport with the patient: current prescription and/or nonprescription medications; alternative treatments, therapies and medications; and street drugs. ~ 06/17/2011 1:37:37PM

## 2011-06-19 NOTE — Telephone Encounter (Signed)
Pt seen in ED 

## 2011-06-21 ENCOUNTER — Ambulatory Visit: Payer: 59 | Admitting: Internal Medicine

## 2011-06-23 ENCOUNTER — Ambulatory Visit (INDEPENDENT_AMBULATORY_CARE_PROVIDER_SITE_OTHER): Payer: 59 | Admitting: Internal Medicine

## 2011-06-23 ENCOUNTER — Encounter: Payer: Self-pay | Admitting: Internal Medicine

## 2011-06-23 VITALS — BP 104/70 | HR 72 | Temp 98.3°F | Wt 126.0 lb

## 2011-06-23 DIAGNOSIS — R079 Chest pain, unspecified: Secondary | ICD-10-CM

## 2011-06-23 DIAGNOSIS — F410 Panic disorder [episodic paroxysmal anxiety] without agoraphobia: Secondary | ICD-10-CM

## 2011-06-23 DIAGNOSIS — R002 Palpitations: Secondary | ICD-10-CM

## 2011-06-23 LAB — CK TOTAL AND CKMB (NOT AT ARMC)
CK, MB: 1.2 ng/mL (ref 0.3–4.0)
Total CK: 59 U/L (ref 7–177)

## 2011-06-23 MED ORDER — METOPROLOL TARTRATE 25 MG PO TABS: ORAL_TABLET | ORAL | Status: DC

## 2011-06-23 MED ORDER — RANITIDINE HCL 150 MG PO TABS: 150.0000 mg | ORAL_TABLET | Freq: Two times a day (BID) | ORAL | Status: DC

## 2011-06-23 NOTE — Patient Instructions (Signed)
Collect all urine for 24 hours as instructed. Please try to go on My Chart within the next 24 hours to allow me to release the results directly to you.  Increase your citalopram to 10 mg daily

## 2011-06-23 NOTE — Progress Notes (Signed)
  Subjective:    Patient ID: Rachel Knight, female    DOB: 1950/06/03, 61 y.o.   MRN: [id number]  HPI She has had frequent palpitations for approximately one month. This was in the context of exogenous stresses producing an art show  in [place] and family health issues.  It reached its peak on 06/17/2011 with incessant palpitations tissue seen in the emergency room; those results were reviewed. Thyroid function tests and electrolytes were normal.  She described the symptoms as frank "panic".  She was prescribed Xanax 0.5 which made her "comatose". She weaned the dose and is not taking it regularly.  She has decreased caffeine and other stimulants and has been exercising with improvement in symptoms until the palpitations recurred last night  With the palpitations she has noted sensation of a pulled muscle in the left inframammary area with radiation to the mid back    Review of Systems She had been on 50,000 international units of vitamin D3 twice a month. Her vitamin D level had been therapeutic in the low 40s.  She brought in additional history to update and correct the chart; data  entered     Objective:   Physical Exam  Gen.: Thin but well-nourished; in no acute distress  Neck: Full range of motion; thyroid is normal to palpation Eyes: Extraocular motion intact; no lid lag or proptosis Heart: Normal rhythm and rate without significant murmur, gallop, or extra heart sounds Lungs: Chest clear to auscultation without rales,rales, wheezes Neuro:Deep tendon reflexes are equal but slightly hyperactive; no tremor  Skin: Warm and dry without significant lesions or rashes; no onycholysis Psych: Normally communicative and interactive; no abnormal mood or affect clinically.         Assessment & Plan:  #1 palpitations  #2 self described panic attacks; pathophysiology of serotonin deficiency discussed  #3 atypical chest pain  Plan: See orders and recommendations

## 2011-06-26 ENCOUNTER — Encounter: Payer: Self-pay | Admitting: Internal Medicine

## 2011-06-26 NOTE — Progress Notes (Signed)
Addended by: Silvio Pate D on: 06/26/2011 04:30 PM   Modules accepted: Orders

## 2011-06-27 ENCOUNTER — Other Ambulatory Visit: Payer: Self-pay | Admitting: Internal Medicine

## 2011-06-27 MED ORDER — CITALOPRAM HYDROBROMIDE 10 MG PO TABS: 20.0000 mg | ORAL_TABLET | Freq: Every day | ORAL | Status: DC

## 2011-06-30 LAB — CATECHOLAMINES, FRACTIONATED, URINE, 24 HOUR
Calculated Total (E+NE): 22 mcg/24 h — ABNORMAL LOW (ref 26–121)
Creatinine, Urine mg/day-CATEUR: 1.03 g/(24.h) (ref 0.63–2.50)
Dopamine, 24 hr Urine: 139 mcg/24 h (ref 52–480)
Total Volume - CF 24Hr U: 1800 mL

## 2011-06-30 LAB — METANEPHRINES, URINE, 24 HOUR
Metaneph Total, Ur: 270 mcg/24 h (ref 224–832)
Normetanephrine, 24H Ur: 181 mcg/24 h (ref 122–676)

## 2011-07-08 ENCOUNTER — Encounter: Payer: Self-pay | Admitting: Internal Medicine

## 2011-07-10 ENCOUNTER — Encounter: Payer: Self-pay | Admitting: Internal Medicine

## 2011-07-10 ENCOUNTER — Other Ambulatory Visit: Payer: Self-pay | Admitting: Internal Medicine

## 2011-07-10 DIAGNOSIS — G479 Sleep disorder, unspecified: Secondary | ICD-10-CM

## 2011-07-10 MED ORDER — CLONAZEPAM 0.5 MG PO TABS: ORAL_TABLET | ORAL | Status: DC

## 2011-07-12 ENCOUNTER — Encounter: Payer: Self-pay | Admitting: Internal Medicine

## 2011-07-20 ENCOUNTER — Encounter: Payer: Self-pay | Admitting: Internal Medicine

## 2011-07-20 ENCOUNTER — Other Ambulatory Visit: Payer: Self-pay | Admitting: Family Medicine

## 2011-07-20 MED ORDER — ALPRAZOLAM 0.5 MG PO TABS: 0.5000 mg | ORAL_TABLET | Freq: Three times a day (TID) | ORAL | Status: AC | PRN

## 2012-01-03 ENCOUNTER — Other Ambulatory Visit: Payer: Self-pay | Admitting: Internal Medicine

## 2012-04-20 ENCOUNTER — Other Ambulatory Visit: Payer: Self-pay

## 2012-05-01 ENCOUNTER — Ambulatory Visit: Payer: 59 | Admitting: *Deleted

## 2012-05-28 ENCOUNTER — Encounter: Payer: Self-pay | Admitting: *Deleted

## 2012-05-29 ENCOUNTER — Ambulatory Visit (INDEPENDENT_AMBULATORY_CARE_PROVIDER_SITE_OTHER): Payer: 59 | Admitting: *Deleted

## 2012-05-29 DIAGNOSIS — I781 Nevus, non-neoplastic: Secondary | ICD-10-CM

## 2012-05-29 NOTE — Progress Notes (Signed)
X=.3% Sotradecol administered with a 27g butterfly.  Patient received a total of 1cc.  Patient had a great result from her previous treatment. Injected only a few tiny veins. Only used 1 cc of foam. Will follow prn.   Photos: yes  Compression stockings applied: yes

## 2012-09-16 ENCOUNTER — Ambulatory Visit (INDEPENDENT_AMBULATORY_CARE_PROVIDER_SITE_OTHER): Payer: 59 | Admitting: Internal Medicine

## 2012-09-16 ENCOUNTER — Encounter: Payer: Self-pay | Admitting: Internal Medicine

## 2012-09-16 VITALS — BP 110/68 | HR 56 | Temp 98.4°F | Resp 12 | Ht 66.0 in | Wt 127.0 lb

## 2012-09-16 DIAGNOSIS — M899 Disorder of bone, unspecified: Secondary | ICD-10-CM

## 2012-09-16 DIAGNOSIS — Z0000 Encounter for general adult medical examination without abnormal findings: Secondary | ICD-10-CM

## 2012-09-16 DIAGNOSIS — Z1211 Encounter for screening for malignant neoplasm of colon: Secondary | ICD-10-CM

## 2012-09-16 DIAGNOSIS — M949 Disorder of cartilage, unspecified: Secondary | ICD-10-CM

## 2012-09-16 DIAGNOSIS — E039 Hypothyroidism, unspecified: Secondary | ICD-10-CM

## 2012-09-16 MED ORDER — CITALOPRAM HYDROBROMIDE 20 MG PO TABS: ORAL_TABLET | ORAL | Status: DC

## 2012-09-16 NOTE — Progress Notes (Signed)
  Subjective:    Patient ID: Rachel Knight, female    DOB: Nov 15, 1950, 62 y.o.   MRN: [id number]  HPI  Rachel Knight is here for a physical;acute issues denied except for intermittent hip pain which responds to position change employing a pillow.     Review of Systems  Her gynecologist is monitoring her osteopenia; her T score is -1.7 at the femoral neck. Her FRAX score does not warrant consideration of bisphosphonates  Additionally her gynecologist is monitoring her hypothyroidism.  Citalopram has been a valuable tool and she wishes to continue this.    Objective:   Physical Exam  Gen.: Thin but healthy and well-nourished in appearance. Alert, appropriate and cooperative throughout exam.Appears younger than stated age  Head: Normocephalic without obvious abnormalities  Eyes: No corneal or conjunctival inflammation noted.  Extraocular motion intact. Vision grossly normal without lenses Ears: External  ear exam reveals no significant lesions or deformities. Canals clear .TMs normal. Hearing is grossly normal bilaterally. Nose: External nasal exam reveals no deformity or inflammation. Nasal mucosa are pink and moist. No lesions or exudates noted.   Mouth: Oral mucosa and oropharynx reveal no lesions or exudates. Teeth in good repair. Neck: No deformities, masses, or tenderness noted. Range of motion & Thyroid normal. Lungs: Normal respiratory effort; chest expands symmetrically. Lungs are clear to auscultation without rales, wheezes, or increased work of breathing. Heart: Slow rate and regular rhythm. Normal S1 and S2. No gallop, click, or rub. No murmur. Abdomen: Bowel sounds normal; abdomen soft and nontender. No masses, organomegaly or hernias noted.Aorta palpable ; no AAA Genitalia: As per Gyn                                  Musculoskeletal/extremities: No deformity or scoliosis noted of  the thoracic or lumbar spine.  No clubbing, cyanosis, edema, or significant extremity  deformity  noted. Range of motion normal .Tone & strength  Normal. Joints normal . Nail health good. Able to lie down & sit up w/o help. Negative SLR bilaterally Vascular: Carotid, radial artery, dorsalis pedis and  posterior tibial pulses are full and equal. No bruits present. Neurologic: Alert and oriented x3. Deep tendon reflexes symmetrical and normal.      Skin: Intact without suspicious lesions or rashes. Lymph: No cervical, axillary lymphadenopathy present. Psych: Mood and affect are normal. Normally interactive                                                                                      Assessment & Plan:  #1 comprehensive physical exam; no acute findings  Plan: see Orders  & Recommendations

## 2012-09-16 NOTE — Patient Instructions (Addendum)
Please  consider fasting Labs : BMET,Lipids, hepatic panel, CBC & dif if not done by Dr Hyacinth Meeker. Code: V70.0

## 2012-09-16 NOTE — Addendum Note (Signed)
Addended by: Maurice Small on: 09/16/2012 03:41 PM   Modules accepted: Orders

## 2012-09-24 ENCOUNTER — Other Ambulatory Visit: Payer: Self-pay | Admitting: Internal Medicine

## 2012-09-24 ENCOUNTER — Encounter: Payer: Self-pay | Admitting: Internal Medicine

## 2012-09-24 DIAGNOSIS — B001 Herpesviral vesicular dermatitis: Secondary | ICD-10-CM

## 2012-09-24 MED ORDER — VALACYCLOVIR HCL 1 G PO TABS: ORAL_TABLET | ORAL | Status: DC

## 2012-09-24 NOTE — Telephone Encounter (Signed)
Hopp please advise, this would be a new rx for the patient

## 2012-10-20 ENCOUNTER — Encounter: Payer: Self-pay | Admitting: Internal Medicine

## 2012-10-21 ENCOUNTER — Other Ambulatory Visit: Payer: Self-pay | Admitting: Internal Medicine

## 2012-10-21 DIAGNOSIS — M5432 Sciatica, left side: Secondary | ICD-10-CM

## 2012-10-30 IMAGING — RF DG FLUORO GUIDE NDL PLC/BX
3 series · 3 of 3 positions shown · non-contrast
Comparison: Left groin pain, question muscle pull versus labral
injury.

CLINICAL DATA: Hip Pain

FLUOROSCOPICALLY GUIDED NEEDLE PLACEMENT FOR MR ARTHROGRAPHY,
LEFTHIP

[Series 1: (hospital) · 1 of 1 slices shown (1 of 3)]
[im 1/1]
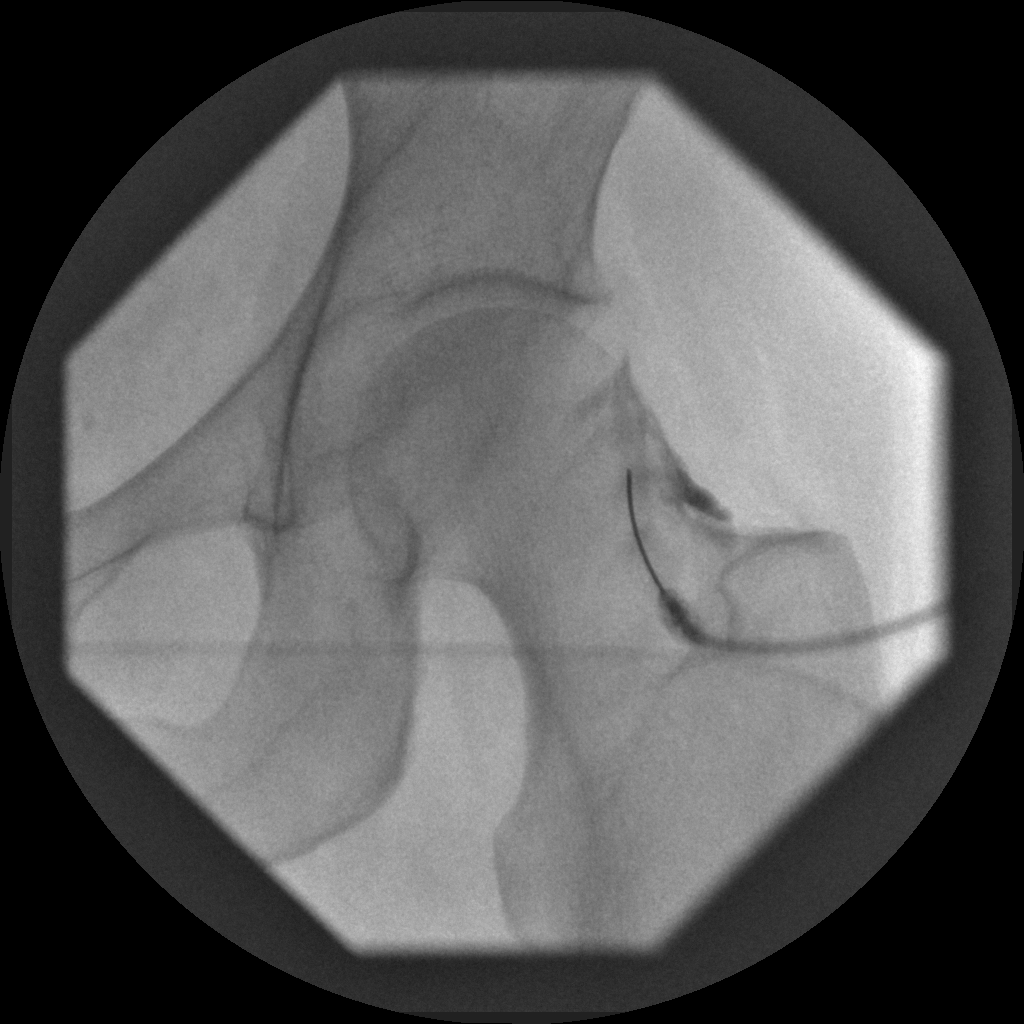

[Series 2: (hospital) · 1 of 1 slices shown (2 of 3)]
[im 1/1]
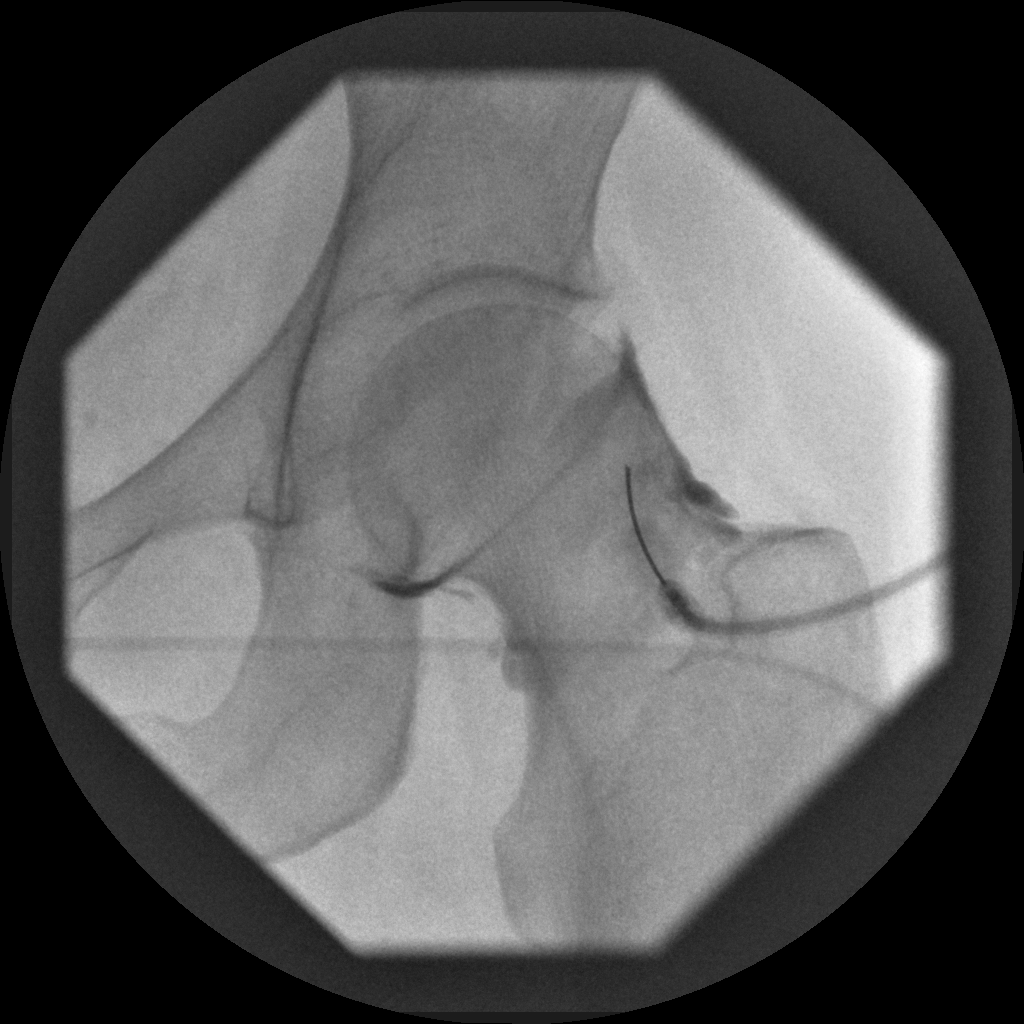

[Series 3: (hospital) · 1 of 1 slices shown (3 of 3)]
[im 1/1]
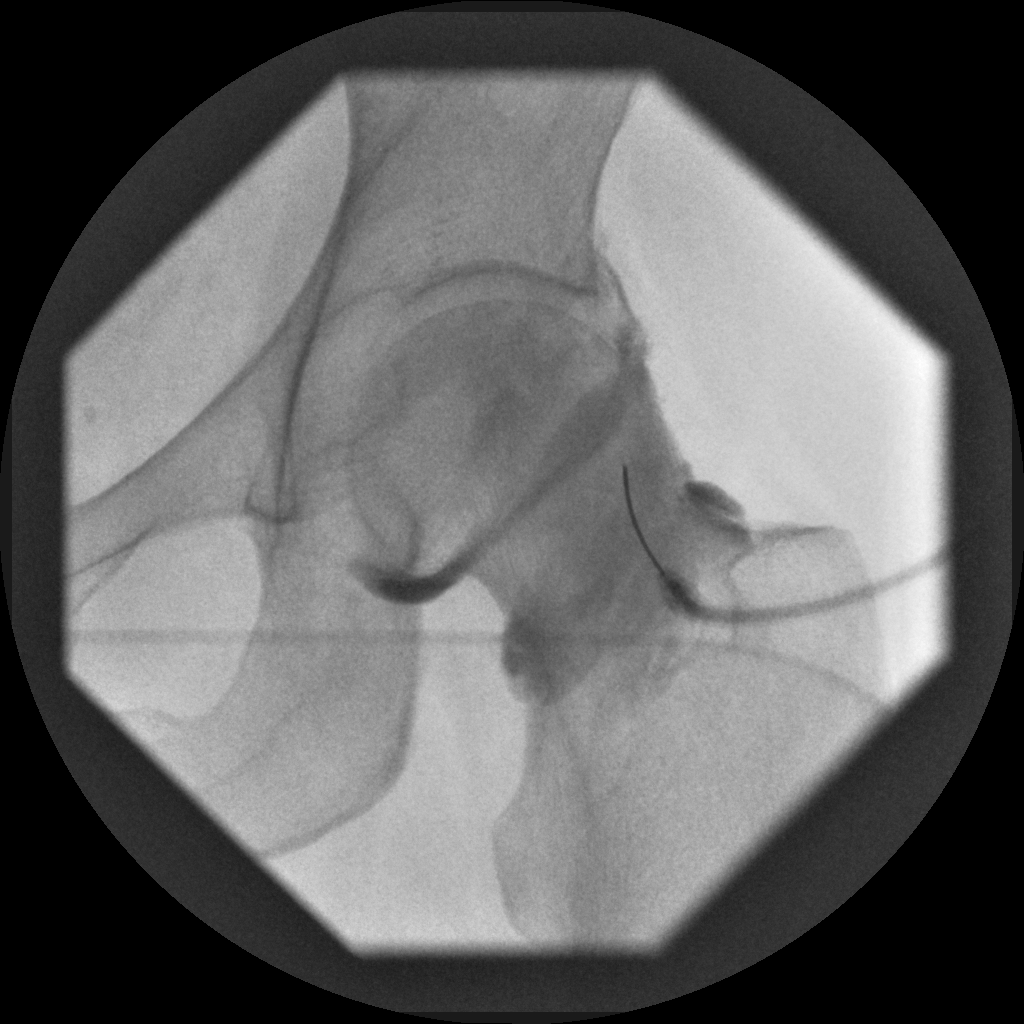

[3 of 3 positions shown; findings below may reference images not displayed]

FINDINGS: Informed, written consent, including pain, infection, and
bleeding, was obtained from the patient before proceeding.  A
thorough Betadine scrub was performed of the left hip.  An
appropriate needle entry point was chosen, and the skin and
subcutaneous tissues were infiltrated with 1% lidocaine.

A 22 gauge spinal needle was placed directly on the inferomedial
aspect of the femoral head, and entry into the   joint was
confirmed using "loss of resistance" technique, with additional
lidocaine.

A [DATE] dilution of Gadolinium (0.1 mL MultiHance), mixed with 15
mL Omnipaque 180 and 5 ml of 1% lidocaine, was injected into the
joint under fluoroscopic guidance.  There was good opacification of
the joint.

The study was performed not as a diagnostic arthrogram, but as a
prelude to MR arthrography.

The needle was withdrawn, a sterile bandage was applied, and the
patient was escorted to the MR suite in a wheelchair.  There were
no immediate complications.
IMPRESSION: Technically successful instillation of dilute MultiHance as
described above for MR arthrography, low hip.

## 2012-12-10 ENCOUNTER — Encounter: Payer: Self-pay | Admitting: Sports Medicine

## 2012-12-10 ENCOUNTER — Ambulatory Visit (INDEPENDENT_AMBULATORY_CARE_PROVIDER_SITE_OTHER): Payer: 59 | Admitting: Sports Medicine

## 2012-12-10 VITALS — BP 94/64 | Ht 66.0 in | Wt 125.0 lb

## 2012-12-10 DIAGNOSIS — M25559 Pain in unspecified hip: Secondary | ICD-10-CM

## 2012-12-10 DIAGNOSIS — M25551 Pain in right hip: Secondary | ICD-10-CM

## 2012-12-10 NOTE — Patient Instructions (Addendum)
3 strength exercises to strengthen the abductors: gluteus medius, gluteus maximus and then also to strengthen the piriformis.   1. Hip abduction strengthening 2. Piriformis: standing rotation 3. Hip abduction strengthening where you pull your leg back instead of keeping it straight on your side.   The 4 th exercise involves tennis ball massage  Finally, stretches, stretching out the external rotators.   Follow up in 6 weeks.

## 2012-12-10 NOTE — Progress Notes (Signed)
Subjective:    Patient ID: Rachel Knight, female    DOB: 05-May-1950, 62 y.o.   MRN: [id number]  HPI 61 yo female who presents to sports medicine clinic for new patient evaluation for discomfort in right gluteus. 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 Since then, she has had a throbbing nagging discomfort that starts in her gluteus and radiates to the lateral aspect of her upper thigh. Discomfort is worst when laying down. She needs to prop her up leg up at night for comfort. Otherwise, she has been able to continue with her physical activity which includes walking and running, without pain. She has been doing stretches which have helped. She has also had massage therapy which also helps.  She is referred by Dr. Alwyn Ren for evaluation of possible sciatica.   Review of Systems Negative except per HPI  Past Medical History  Diagnosis Date  . Perennial allergic rhinitis   . Osteopenia   . Hypothyroidism    Past Surgical History  Procedure Laterality Date  . Breast enhancement surgery  1996  . G 2 p 2    . Colonoscopy  2000 , 2007    Dr Juanda Chance. Due 2014  . Appendectomy  2011   Family History  Problem Relation Age of Onset  . Stroke Father 8  . Colon polyps Father   . Other Brother 45    OS muscle paralysis , resolution over 3 days. Neg evaluation  . Colon cancer Paternal Grandfather   . Coronary artery disease Maternal Grandmother 64  . Colon cancer Paternal Grandmother   . Coronary artery disease Maternal Grandfather 60  . Diabetes Neg Hx    History   Social History  . Marital Status: Married    Spouse Name: N/A    Number of Children: N/A  . Years of Education: N/A   Occupational History  . Not on file.   Social History Main Topics  . Smoking status: Former Games developer  . Smokeless tobacco: Never Used     Comment: only smoked age [age] , 1 ppweek  . Alcohol Use: 4.2 oz/week    7 Shots of liquor per week      Comment:  socially       Objective:   Physical Exam Filed Vitals:   12/10/12 0906 12/10/12 0907  BP:  94/64  Height: 5\' 6"  (1.676 m)   Weight: 125 lb (56.7 kg)    General: pleasant woman who appears younger than stated age, in no acute distress Back exam: normal extension, flexion, lateral bend bilaterally, no tenderness to palpation along lumbar spine Negative straight leg bilaterally 5/5 strength with knee flexion, extension, hip flexion and plantar/dorsiflexion bilaterally Tenderness to palpation along right piriformis  Decreased strength with reproducible pain on gluteus max testing on right compared to left, normal gluteus medius and tensor fascia lata reproducible pain with external rotation of hip and figure of 4 test       Assessment & Plan:  62 yo female with h/o hypothyroid who presents with right gluteal and thigh pain likely from involvement of piriformis strain as well as weak hip abductors specifically gluteus maximus. Pain refers down the ITB track  Plan:  - exercises given for hip abduction strengthening as well as piriformis strengthening - massage of piriformis muscle with tennis ball - continue massage therapy - follow up in 6 weeks.   Marena Chancy, PGY-3 Family Medicine Resident

## 2012-12-10 NOTE — Assessment & Plan Note (Signed)
This is chronic I think she originally injured hip MM of glut Max and piriformis during moving She has learned to compensate but has never rehabbed these injured areas  Use HEP I think she can make a lot of progress in 6 to 8 wks  I did not find evidence of sciatic injury

## 2013-01-09 ENCOUNTER — Other Ambulatory Visit: Payer: Self-pay

## 2013-01-20 ENCOUNTER — Ambulatory Visit (INDEPENDENT_AMBULATORY_CARE_PROVIDER_SITE_OTHER): Payer: 59 | Admitting: Obstetrics and Gynecology

## 2013-01-20 ENCOUNTER — Ambulatory Visit: Payer: Self-pay | Admitting: Obstetrics and Gynecology

## 2013-01-20 ENCOUNTER — Encounter: Payer: Self-pay | Admitting: Obstetrics and Gynecology

## 2013-01-20 VITALS — BP 120/60 | HR 60 | Ht 65.75 in | Wt 127.0 lb

## 2013-01-20 DIAGNOSIS — Z0000 Encounter for general adult medical examination without abnormal findings: Secondary | ICD-10-CM

## 2013-01-20 DIAGNOSIS — M8580 Other specified disorders of bone density and structure, unspecified site: Secondary | ICD-10-CM

## 2013-01-20 DIAGNOSIS — E039 Hypothyroidism, unspecified: Secondary | ICD-10-CM

## 2013-01-20 DIAGNOSIS — M899 Disorder of bone, unspecified: Secondary | ICD-10-CM

## 2013-01-20 DIAGNOSIS — Z01419 Encounter for gynecological examination (general) (routine) without abnormal findings: Secondary | ICD-10-CM

## 2013-01-20 LAB — HEMOGLOBIN, FINGERSTICK: Hemoglobin, fingerstick: 14.5 g/dL (ref 12.0–16.0)

## 2013-01-20 LAB — POCT URINALYSIS DIPSTICK
Ketones, UA: NEGATIVE
Leukocytes, UA: NEGATIVE
Nitrite, UA: NEGATIVE
Protein, UA: NEGATIVE
Urobilinogen, UA: NEGATIVE

## 2013-01-20 LAB — CBC
MCH: 32.2 pg (ref 26.0–34.0)
MCHC: 33.9 g/dL (ref 30.0–36.0)
Platelets: 255 10*3/uL (ref 150–400)
RDW: 13.3 % (ref 11.5–15.5)
WBC: 6.4 10*3/uL (ref 4.0–10.5)

## 2013-01-20 LAB — COMPREHENSIVE METABOLIC PANEL
ALT: 15 U/L (ref 0–35)
AST: 17 U/L (ref 0–37)
Albumin: 4.7 g/dL (ref 3.5–5.2)
CO2: 28 mEq/L (ref 19–32)
Calcium: 10.1 mg/dL (ref 8.4–10.5)
Chloride: 105 mEq/L (ref 96–112)
Creat: 0.79 mg/dL (ref 0.50–1.10)
Potassium: 4.4 mEq/L (ref 3.5–5.3)
Sodium: 139 mEq/L (ref 135–145)
Total Protein: 7 g/dL (ref 6.0–8.3)

## 2013-01-20 LAB — LIPID PANEL
HDL: 72 mg/dL (ref >39)
LDL Cholesterol: 75 mg/dL (ref 0–99)
Total CHOL/HDL Ratio: 2.5 Ratio
Triglycerides: 175 mg/dL — ABNORMAL HIGH (ref <150)
VLDL: 35 mg/dL (ref 0–40)

## 2013-01-20 MED ORDER — CITALOPRAM HYDROBROMIDE 20 MG PO TABS: ORAL_TABLET | ORAL | Status: DC

## 2013-01-20 MED ORDER — LEVOTHYROXINE SODIUM 75 MCG PO TABS: 75.0000 ug | ORAL_TABLET | Freq: Every day | ORAL | Status: DC

## 2013-01-20 MED ORDER — ESTRADIOL 10 MCG VA TABS: 10.0000 ug | ORAL_TABLET | VAGINAL | Status: DC

## 2013-01-20 NOTE — Patient Instructions (Signed)

## 2013-01-20 NOTE — Progress Notes (Signed)
Patient ID: Rachel Knight, female   DOB: 05-18-1950, 62 y.o.   MRN: [id number] GYNECOLOGY VISIT  PCP:   Marga Melnick, MD  Referring provider:   HPI: 62 y.o.   Married  Caucasian  female   G2P2002 with Patient's last menstrual period was 06/05/2006.   here for   AEX. Some urinary frequency.   No leakage of urine.  Wants to continue with Vagifem.  Wants to continue Citalopram for anxiety and subsequent depression.   Hgb:    14.5 Urine:   Neg  GYNECOLOGIC HISTORY: Patient's last menstrual period was 06/05/2006. Sexually active:  yes Partner preference: female Contraception:   postmenopausal Menopausal hormone therapy: no DES exposure:   no Blood transfusions: no   Sexually transmitted diseases:   HSV II GYN Procedures:  Breast Ausmentation 2011 Mammogram:  03-21-11 WUJ:[id number]               Pap:   01-05-11 wnl History of abnormal pap smear:  no   OB History   Grav Para Term Preterm Abortions TAB SAB Ect Mult Living   2 2 2       2        LIFESTYLE: Exercise:     Walking, gym, weights and swimming          Tobacco:     no Alcohol:        7-9 drinks per week Drug use:     no  OTHER HEALTH MAINTENANCE: Tetanus/TDap:    2010 Gardisil:                n/a Influenza:              12/2012 Zostavax:               never  Bone density:   03-21-11:osteopenia:[facility].  T score of spine - 1.4, right hip - 1/7, left hip - 1.7.  Colonoscopy:   07/2005 wnl:Dr. Lina Sar  Cholesterol check:  unsure  Family History  Problem Relation Age of Onset  . Stroke Father 86  . Colon polyps Father   . Other Brother 45    OS muscle paralysis , resolution over 3 days. Neg evaluation  . Colon cancer Paternal Grandfather   . Coronary artery disease Maternal Grandmother 57  . Colon cancer Paternal Grandmother   . Coronary artery disease Maternal Grandfather 60  . Diabetes Neg Hx   . Breast cancer Mother 29  . Thyroid disease Mother     Patient Active Problem List   Diagnosis Date Noted  .  Right hip pain 12/10/2012  . HYPOTHYROIDISM 11/30/2009  . VITAMIN D DEFICIENCY 11/30/2009  . ALLERGIC RHINITIS 11/30/2009  . OSTEOPENIA 11/30/2009   Past Medical History  Diagnosis Date  . Perennial allergic rhinitis   . Osteopenia   . Hypothyroidism   . Anxiety   . Depression   . Substance abuse     HSV II    Past Surgical History  Procedure Laterality Date  . Breast enhancement surgery  1996  . Colonoscopy  2000 , 2007    Dr Juanda Chance. Due 2014  . Appendectomy  2011  . Augmentation mammaplasty      ALLERGIES: Review of patient's allergies indicates no known allergies.  Current Outpatient Prescriptions  Medication Sig Dispense Refill  . Calcium Carbonate-Vitamin D (CALCIUM-VITAMIN D) 500-200 MG-UNIT per tablet Take 1 tablet by mouth daily.       . citalopram (CELEXA) 20 MG tablet TAKE 1 TABLET BY  MOUTH DAILY  90 tablet  3  . cyanocobalamin 500 MCG tablet Take 500 mcg by mouth daily.      . Estradiol (VAGIFEM) 10 MCG TABS vaginal tablet Place vaginally. 2 x weekly      . ipratropium (ATROVENT) 0.06 % nasal spray Place 2 sprays into the nose 2 (two) times daily as needed. For allergy related sinus relief      . levothyroxine (LEVOXYL) 75 MCG tablet Take 75 mcg by mouth daily.        . Melatonin 1 MG TABS Take by mouth. 1/4 of tab couple times weekly      . Omega-3 Fatty Acids (FISH OIL) 1000 MG CAPS Take by mouth daily.      . Probiotic Product (PROBIOTIC DAILY PO) Take 400 mg by mouth daily.      Marland Kitchen tretinoin (RETIN-A) 0.025 % cream Apply 1 application topically at bedtime.      . valACYclovir (VALTREX) 1000 MG tablet 2 pills every 12 hrs X 2 doses  4 tablet  3  . Vitamin D, Ergocalciferol, (DRISDOL) 50000 UNITS CAPS Take 50,000 Units by mouth. Twice monthly       No current facility-administered medications for this visit.     ROS:  Pertinent items are noted in HPI.  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   PHYSICAL  EXAMINATION:    BP 120/60  Pulse 60  Ht 5' 5.75" (1.67 m)  Wt 127 lb (57.607 kg)  BMI 20.66 kg/m2  LMP 06/05/2006   Wt Readings from Last 3 Encounters:  01/20/13 127 lb (57.607 kg)  12/10/12 125 lb (56.7 kg)  09/16/12 127 lb (57.607 kg)     Ht Readings from Last 3 Encounters:  01/20/13 5' 5.75" (1.67 m)  12/10/12 5\' 6"  (1.676 m)  09/16/12 5\' 6"  (1.676 m)    General appearance: alert, cooperative and appears stated age Head: Normocephalic, without obvious abnormality, atraumatic Neck: no adenopathy, supple, symmetrical, trachea midline and thyroid not enlarged, symmetric, no tenderness/mass/nodules Lungs: clear to auscultation bilaterally Breasts: Bilateral implants.  No nipple retraction or dimpling, No nipple discharge or bleeding, No axillary or supraclavicular adenopathy, Normal to palpation without dominant masses Heart: regular rate and rhythm Abdomen: soft, non-tender; no masses,  no organomegaly Extremities: extremities normal, atraumatic, no cyanosis or edema Skin: Skin color, texture, turgor normal. No rashes or lesions Lymph nodes: Cervical, supraclavicular, and axillary nodes normal. No abnormal inguinal nodes palpated Neurologic: Grossly normal  Pelvic: External genitalia:  no lesions              Urethra:  normal appearing urethra with no masses, tenderness or lesions              Bartholins and Skenes: normal                 Vagina: normal appearing vagina with normal color and discharge, no lesions              Cervix: normal appearance              Pap and high risk HPV testing done: yes.            Bimanual Exam:  Uterus:  uterus is normal size, shape, consistency and nontender  Adnexa: normal adnexa in size, nontender and no masses                                      Rectovaginal: Confirms                                      Anus:  normal sphincter tone, no lesions  ASSESSMENT  Normal gynecologic exam. Atrophic  vaginitis.  History of HSV II. Depression and anxiety. Osteopenia.  Hypothyroidism.  PLAN  Mammogram yearly.  Pap smear and high risk HPV testing Refill on Vagifem.  Patient counseled on risks and benefits.   Refill on Citalopram. Bone density in January 2015 at [facility].  Refill of Levoxyl. Medications per Epic orders Labs - FLP, CMP, CBC, Vitamin D.  Return annually or prn   An After Visit Summary was printed and given to the patient.

## 2013-01-21 ENCOUNTER — Ambulatory Visit: Payer: 59 | Admitting: Sports Medicine

## 2013-01-21 LAB — VITAMIN D 25 HYDROXY (VIT D DEFICIENCY, FRACTURES): Vit D, 25-Hydroxy: 49 ng/mL (ref 30–89)

## 2013-01-22 LAB — IPS PAP TEST WITH HPV

## 2013-01-26 ENCOUNTER — Other Ambulatory Visit: Payer: Self-pay | Admitting: Obstetrics and Gynecology

## 2013-01-26 DIAGNOSIS — E781 Pure hyperglyceridemia: Secondary | ICD-10-CM

## 2013-02-24 ENCOUNTER — Other Ambulatory Visit (INDEPENDENT_AMBULATORY_CARE_PROVIDER_SITE_OTHER): Payer: 59

## 2013-02-24 DIAGNOSIS — E781 Pure hyperglyceridemia: Secondary | ICD-10-CM

## 2013-02-24 LAB — LIPID PANEL
LDL Cholesterol: 90 mg/dL (ref 0–99)
Total CHOL/HDL Ratio: 2.4 Ratio
VLDL: 12 mg/dL (ref 0–40)

## 2013-03-12 ENCOUNTER — Ambulatory Visit: Payer: 59 | Admitting: *Deleted

## 2013-04-01 ENCOUNTER — Encounter: Payer: Self-pay | Admitting: *Deleted

## 2013-04-02 ENCOUNTER — Ambulatory Visit (INDEPENDENT_AMBULATORY_CARE_PROVIDER_SITE_OTHER): Payer: Self-pay | Admitting: *Deleted

## 2013-04-02 DIAGNOSIS — I781 Nevus, non-neoplastic: Secondary | ICD-10-CM

## 2013-04-02 NOTE — Progress Notes (Signed)
   Cutaneous Laser:pulsed mode  810 j/cm2 435ms delay 25ms duration 0.5 spot  Total pulses: 389 Total energy 623.97  Total time::05  Photos: no  Compression stockings applied: NA  Pt doing well. Just needed a touch up with the cutaneous laser. Tol proced well. Anticipate good results. Follow prn.

## 2013-04-14 ENCOUNTER — Telehealth: Payer: Self-pay

## 2013-04-14 NOTE — Telephone Encounter (Signed)
lmtcb-to discuss BMD results. 

## 2013-04-14 NOTE — Telephone Encounter (Signed)
Patient notified of BMD results. States is not currently on her RX of Vitamin D, but taking one calcium with 250 IU of vitamin d. Advised she could add one 800 IU to this daily and will recheck at her AEX. Patient agrees.

## 2013-04-27 ENCOUNTER — Encounter: Payer: Self-pay | Admitting: Internal Medicine

## 2013-06-19 ENCOUNTER — Ambulatory Visit (INDEPENDENT_AMBULATORY_CARE_PROVIDER_SITE_OTHER): Payer: BC Managed Care – PPO | Admitting: Family Medicine

## 2013-06-19 ENCOUNTER — Encounter: Payer: Self-pay | Admitting: Family Medicine

## 2013-06-19 VITALS — BP 90/60 | Temp 98.0°F | Wt 128.0 lb

## 2013-06-19 DIAGNOSIS — R300 Dysuria: Secondary | ICD-10-CM

## 2013-06-19 MED ORDER — CIPROFLOXACIN HCL 250 MG PO TABS: 250.0000 mg | ORAL_TABLET | Freq: Two times a day (BID) | ORAL | Status: DC

## 2013-06-19 NOTE — Addendum Note (Signed)
Addended by: Colleen Can on: 06/19/2013 02:29 PM   Modules accepted: Orders

## 2013-06-19 NOTE — Progress Notes (Signed)
Pre visit review using our clinic review tool, if applicable. No additional management support is needed unless otherwise documented below in the visit note. 

## 2013-06-19 NOTE — Progress Notes (Signed)
Chief Complaint  Patient presents with  . Dysuria    frequency, heaviness    HPI:  Acute visit for Dysuria: -could not get appointment with PCP or PCP office -started a few days ago -reports mild urgency, frequency and pressure -denies: pain, flank or pelvic pain, hematuria, malaise, fever, NV, vaginal discharge  ROS: See pertinent positives and negatives per HPI.  Past Medical History  Diagnosis Date  . Perennial allergic rhinitis   . Osteopenia   . Hypothyroidism   . Anxiety   . Depression   . Substance abuse     HSV II    Past Surgical History  Procedure Laterality Date  . Breast enhancement surgery  1996  . Colonoscopy  2000 , 2007    Dr Olevia Perches. Due 2014  . Appendectomy  2011  . Augmentation mammaplasty      Family History  Problem Relation Age of Onset  . Stroke Father 57  . Colon polyps Father   . Other Brother 45    OS muscle paralysis , resolution over 3 days. Neg evaluation  . Colon cancer Paternal Grandfather   . Coronary artery disease Maternal Grandmother 63  . Colon cancer Paternal Grandmother   . Coronary artery disease Maternal Grandfather 60  . Diabetes Neg Hx   . Breast cancer Mother 63  . Thyroid disease Mother     History   Social History  . Marital Status: Married    Spouse Name: N/A    Number of Children: N/A  . Years of Education: N/A   Social History Main Topics  . Smoking status: Former Research scientist (life sciences)  . Smokeless tobacco: Never Used     Comment: only smoked age [age] , 1 ppweek  . Alcohol Use: 4.2 oz/week    7 Shots of liquor per week     Comment:  socially  . Drug Use: No  . Sexual Activity: Yes    Partners: Male    Birth Control/ Protection: Post-menopausal   Other Topics Concern  . None   Social History Narrative  . None    Current outpatient prescriptions:Calcium Carbonate-Vitamin D (CALCIUM-VITAMIN D) 500-200 MG-UNIT per tablet, Take 1 tablet by mouth daily. , Disp: , Rfl: ;  citalopram (CELEXA) 20 MG tablet, TAKE 1  TABLET BY MOUTH DAILY, Disp: 30 tablet, Rfl: 11;  cyanocobalamin 500 MCG tablet, Take 500 mcg by mouth daily., Disp: , Rfl:  Estradiol (VAGIFEM) 10 MCG TABS vaginal tablet, Place 1 tablet (10 mcg total) vaginally 2 (two) times a week. 2 x weekly, Disp: 8 tablet, Rfl: 11;  ipratropium (ATROVENT) 0.06 % nasal spray, Place 2 sprays into the nose 2 (two) times daily as needed. For allergy related sinus relief, Disp: , Rfl: ;  levothyroxine (LEVOXYL) 75 MCG tablet, Take 1 tablet (75 mcg total) by mouth daily., Disp: 30 tablet, Rfl: 11 Melatonin 1 MG TABS, Take by mouth. 1/4 of tab couple times weekly, Disp: , Rfl: ;  Omega-3 Fatty Acids (FISH OIL) 1000 MG CAPS, Take by mouth daily., Disp: , Rfl: ;  Probiotic Product (PROBIOTIC DAILY PO), Take 400 mg by mouth daily., Disp: , Rfl: ;  tretinoin (RETIN-A) 0.025 % cream, Apply 1 application topically at bedtime., Disp: , Rfl: ;  valACYclovir (VALTREX) 1000 MG tablet, 2 pills every 12 hrs X 2 doses, Disp: 4 tablet, Rfl: 3 Vitamin D, Ergocalciferol, (DRISDOL) 50000 UNITS CAPS, Take 50,000 Units by mouth. Twice monthly, Disp: , Rfl: ;  ciprofloxacin (CIPRO) 250 MG tablet, Take 1 tablet (  250 mg total) by mouth 2 (two) times daily., Disp: 6 tablet, Rfl: 0  EXAM:  Filed Vitals:   06/19/13 1404  BP: 90/60  Temp: 98 F (36.7 C)    Body mass index is 20.82 kg/(m^2).  GENERAL: vitals reviewed and listed above, alert, oriented, appears well hydrated and in no acute distress  HEENT: atraumatic, conjunttiva clear, no obvious abnormalities on inspection of external nose and ears  NECK: no obvious masses on inspection  LUNGS: clear to auscultation bilaterally, no wheezes, rales or rhonchi, good air movement  CV: HRRR, no peripheral edema  ABD: NTTP, soft, no CVA TTP  MS: moves all extremities without noticeable abnormality  PSYCH: pleasant and cooperative, no obvious depression or anxiety  ASSESSMENT AND PLAN:  Discussed the following assessment and  plan:  Dysuria - Plan: POCT urinalysis dipstick, ciprofloxacin (CIPRO) 250 MG tablet  -udip looks like UTI, culture pending, ampiric abx after treatment risks andbenefits -Patient advised to return or notify a doctor immediately if symptoms worsen or persist or new concerns arise.  There are no Patient Instructions on file for this visit.   Lucretia Kern

## 2013-06-23 LAB — URINE CULTURE: Colony Count: >=100000

## 2013-07-08 ENCOUNTER — Ambulatory Visit (INDEPENDENT_AMBULATORY_CARE_PROVIDER_SITE_OTHER): Payer: BC Managed Care – PPO | Admitting: Internal Medicine

## 2013-07-08 ENCOUNTER — Other Ambulatory Visit: Payer: BC Managed Care – PPO

## 2013-07-08 ENCOUNTER — Encounter: Payer: Self-pay | Admitting: Internal Medicine

## 2013-07-08 VITALS — BP 96/58 | HR 72 | Temp 97.2°F | Resp 13 | Wt 128.0 lb

## 2013-07-08 DIAGNOSIS — R300 Dysuria: Secondary | ICD-10-CM

## 2013-07-08 DIAGNOSIS — R319 Hematuria, unspecified: Secondary | ICD-10-CM

## 2013-07-08 LAB — POCT URINALYSIS DIPSTICK
Bilirubin, UA: NEGATIVE
Glucose, UA: NEGATIVE
KETONES UA: NEGATIVE
Nitrite, UA: NEGATIVE
PROTEIN UA: NEGATIVE
Spec Grav, UA: <=1.005
Urobilinogen, UA: 0.2
pH, UA: 5

## 2013-07-08 MED ORDER — PHENAZOPYRIDINE HCL 200 MG PO TABS: 200.0000 mg | ORAL_TABLET | Freq: Three times a day (TID) | ORAL | Status: DC | PRN

## 2013-07-08 MED ORDER — NITROFURANTOIN MONOHYD MACRO 100 MG PO CAPS: 100.0000 mg | ORAL_CAPSULE | Freq: Two times a day (BID) | ORAL | Status: DC

## 2013-07-08 NOTE — Progress Notes (Signed)
   Subjective:    Patient ID: Rachel Knight, female    DOB: Jul 29, 1950, 63 y.o.   MRN: [id number]  HPI   She's had an exacerbation of urinary tract symptoms approximately 07/03/13. This is associated with lethargy particularly in the afternoons.  This appears to be almost a carbon copy of events which occurred 4/16. At that time she had dysuria, hesitancy, frequency, and the associated lethargy. She was found to have urinary tract infection with over 100,000 Escherichia coli for which she received Cipro.  There was complete resolution of the symptoms until 4/30  Her CBC and differential and thyroid were normal in November 2014  Review of Systems   She specifically denies fever, chills, or sweats.  She has no associated flank pain. She also does not note hematuria or pyuria.       Objective:   Physical Exam   General appearance : Thin but in  good health and nourishment w/o distress.  Eyes: No conjunctival inflammation or scleral icterus is present.  Oral exam: Dental hygiene is good; lips and gums are healthy appearing.There is no oropharyngeal erythema or exudate noted.   Heart:  Normal rate and regular rhythm. S1 and S2 normal without gallop, murmur, click, rub or other extra sounds     Lungs:Chest clear to auscultation; no wheezes, rhonchi,rales ,or rubs present.No increased work of breathing.   Abdomen: bowel sounds normal, soft  But tender suprapubically without masses, organomegaly or hernias noted.  No guarding or rebound . No tenderness over the flanks to percussion  Musculoskeletal: Able to lie flat and sit up without help. Negative straight leg raising bilaterally. Gait normal  Skin:Warm & dry.  Intact without suspicious lesions or rashes ; no jaundice or tenting  Lymphatic: No lymphadenopathy is noted about the head, neck, axilla.              Assessment & Plan:  #1 dysuria See orders

## 2013-07-08 NOTE — Progress Notes (Signed)
Pre visit review using our clinic review tool, if applicable. No additional management support is needed unless otherwise documented below in the visit note. 

## 2013-07-08 NOTE — Patient Instructions (Signed)
Drink as much nondairy fluids as possible. Avoid spicy foods or alcohol as  these may aggravate the bladder. Do not take decongestants. Avoid narcotics if possible. 

## 2013-07-08 NOTE — Addendum Note (Signed)
Addended by: Roma Schanz R on: 07/08/2013 03:05 PM   Modules accepted: Orders

## 2013-07-11 LAB — URINE CULTURE: Colony Count: >=100000

## 2013-07-22 ENCOUNTER — Encounter: Payer: Self-pay | Admitting: Sports Medicine

## 2013-07-22 ENCOUNTER — Ambulatory Visit (INDEPENDENT_AMBULATORY_CARE_PROVIDER_SITE_OTHER): Payer: BC Managed Care – PPO | Admitting: Sports Medicine

## 2013-07-22 VITALS — BP 108/70 | Ht 66.0 in | Wt 127.0 lb

## 2013-07-22 DIAGNOSIS — M25551 Pain in right hip: Secondary | ICD-10-CM

## 2013-07-22 DIAGNOSIS — M25559 Pain in unspecified hip: Secondary | ICD-10-CM

## 2013-07-22 MED ORDER — AMITRIPTYLINE HCL 25 MG PO TABS: 25.0000 mg | ORAL_TABLET | Freq: Every day | ORAL | Status: DC

## 2013-07-22 NOTE — Patient Instructions (Signed)
You have some scarring of the muscle that looks l;ike you had a muscle tear or strain at one time.   Try the following for the next month and come back if you havent gotten better:  - Tennis ball massage - Internal and external rotation  - Continue swimming and yoga - Start amitriptyline at night.

## 2013-07-22 NOTE — Assessment & Plan Note (Addendum)
Continued, no improvement from previous.  - MSK [place] today with fibrotic changes and small amount of edema in glut and piriformis indicating old injury with current irritation - Recommended:   - Tennis ball massage  - Internal and external rotation   - Continue swimming and yoga - Started amitriptyline at night to try to help with sleep and stop the spasm cycle.  - Follow up in 1 month, consider US guided steroid injection at that time if no improvement

## 2013-07-22 NOTE — Progress Notes (Signed)
Patient ID: Rachel Knight, female   DOB: 1950/05/15, 63 y.o.   MRN: [id number]    [facility] [address] [place], [place] [address] Phone: [phone] Fax: [phone]   Patient Name: Rachel Knight Date of Birth: January 22, 1951 Medical Record Number: [id number] Gender: female Date of Encounter: 07/22/2013  History of Present Illness:  Rachel Knight is a 63 y.o. very pleasant female patient who presents with the following:  R gluteus and hip pain. She state that it all began in the fall when she moved into a new home with steep stairs. She has discomfort on her R glut and post thigh that radiates down to her posterior mid thigh. She has occasional R groin pain but this doesn't seem to be the main problem. She complains that she has trouble sleeping because of the pain and gets some relief with heat and ibuprofen. She has been doing the prescribed exercises without major relief.   She felt a R post thigh crunching pain during a  stretch during  Yoga last week.   She has also had some improvement with recent addition of swimming, planking, and yoga to her exercise routine. 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 Right hip pain 12/10/2012  . HYPOTHYROIDISM 11/30/2009  . VITAMIN D DEFICIENCY 11/30/2009  . ALLERGIC RHINITIS 11/30/2009  . OSTEOPENIA 11/30/2009   Past Medical History  Diagnosis Date  . Perennial allergic rhinitis   . Osteopenia   . Hypothyroidism   . Anxiety   . Depression   . Substance abuse     HSV II   Past Surgical History  Procedure Laterality Date  . Breast enhancement surgery  1996  . Colonoscopy  2000 , 2007    Dr Olevia Perches. Due 2014  . Appendectomy  2011  . Augmentation mammaplasty     History  Substance Use Topics  . Smoking status: Former Research scientist (life sciences)  . Smokeless tobacco: Never Used     Comment: only smoked age  [age] , 1 ppweek  . Alcohol Use: 4.2 oz/week    7 Shots of liquor per week     Comment:  socially   Family History  Problem Relation Age of Onset  . Stroke Father 22  . Colon polyps Father   . Other Brother 45    OS muscle paralysis , resolution over 3 days. Neg evaluation  . Colon cancer Paternal Grandfather   . Coronary artery disease Maternal Grandmother 63  . Colon cancer Paternal Grandmother   . Coronary artery disease Maternal Grandfather 60  . Diabetes Neg Hx   . Breast cancer Mother 60  . Thyroid disease Mother    No Known Allergies  Medication list has been reviewed and updated.  Prior to Admission medications   Medication Sig Start Date End Date Taking? Authorizing Provider  Calcium Carbonate-Vitamin D (CALCIUM-VITAMIN D) 500-200 MG-UNIT per tablet Take 1 tablet by mouth daily.     Historical Provider, MD  citalopram (CELEXA) 20 MG tablet TAKE 1 TABLET BY MOUTH DAILY 01/20/13   Brook E Amundson de Berton Lan, MD  cyanocobalamin 500 MCG tablet Take 500 mcg by mouth daily.    Historical Provider, MD  ipratropium (ATROVENT) 0.06 % nasal spray Place 2 sprays into the nose 2 (two) times daily as needed. For allergy related sinus relief    Historical Provider, MD  levothyroxine (LEVOXYL) 75 MCG tablet Take 1 tablet (75 mcg total) by mouth daily. 01/20/13   [place], MD  Melatonin 1 MG TABS Take by mouth. 1/4 of tab couple times weekly    Historical Provider, MD  nitrofurantoin, macrocrystal-monohydrate, (MACROBID) 100 MG capsule Take 1 capsule (100 mg total) by mouth 2 (two) times daily. 07/08/13   Hendricks Limes, MD  Omega-3 Fatty Acids (FISH OIL) 1000 MG CAPS Take by mouth daily.    Historical Provider, MD  omeprazole (PRILOSEC) 20 MG capsule  07/14/13   Historical Provider, MD  phenazopyridine (PYRIDIUM) 200 MG tablet Take 1 tablet (200 mg total) by mouth 3 (three) times daily as needed for pain. 07/08/13   Hendricks Limes, MD  Probiotic  Product (PROBIOTIC DAILY PO) Take 400 mg by mouth daily.    Historical Provider, MD  tretinoin (RETIN-A) 0.025 % cream Apply 1 application topically at bedtime.    Historical Provider, MD  VAGIFEM 10 MCG TABS vaginal tablet  06/27/13   Historical Provider, MD    Review of Systems:  Per HPI  Physical Examination: Filed Vitals:   07/22/13 0957  BP: 108/70   Filed Vitals:   07/22/13 0957  Height: 5\' 6"  (1.676 m)  Weight: 127 lb (57.607 kg)   Body mass index is 20.51 kg/(m^2).  Gen: NAD, alert, cooperative with exam HEENT: NCAT Neuro: Alert and oriented, No gross deficits MSK:  R hip with negative log roll and full ROM  Strength full strength with flexion, abduction, and adduction. PAin and weakness (4/5 strength) with extention + Abduction  Tenderness with palpation of R SI  Neg SLR  MSK [place] No tear noted in gluteus medius Lower muscle belly shows hyperechoic change suggestive of fibrosis Piriformis shows no tear but hypoechoic area below muscle belly   Assessment and Plan:   Right hip pain Continued, no improvement from previous.  - MSK [place] today with fibrotic changes and small amount of edema in glut and piriformis indicating old injury with current irritation - Recommended:   - Tennis ball massage  - Internal and external rotation   - Continue swimming and yoga - Started amitriptyline at night to try to help with sleep and stop the spasm cycle.  - Follow up in 1 month, consider US guided steroid injection at that time if no improvement    Timmothy Euler, MD

## 2013-08-21 ENCOUNTER — Ambulatory Visit (INDEPENDENT_AMBULATORY_CARE_PROVIDER_SITE_OTHER): Payer: BC Managed Care – PPO | Admitting: Sports Medicine

## 2013-08-21 ENCOUNTER — Encounter: Payer: Self-pay | Admitting: Sports Medicine

## 2013-08-21 VITALS — BP 107/73 | Ht 66.0 in | Wt 126.0 lb

## 2013-08-21 DIAGNOSIS — M25559 Pain in unspecified hip: Secondary | ICD-10-CM

## 2013-08-21 DIAGNOSIS — M25551 Pain in right hip: Secondary | ICD-10-CM

## 2013-08-21 NOTE — Progress Notes (Signed)
Patient ID: Rachel Knight, female   DOB: 11-11-1950, 63 y.o.   MRN: [id number]  Patient returns having improved quite a bit up from last visit However, her improvement seems to relate more to use of tramadol and amitriptyline These make her too drowsy and she can't function She is wondering about trying to go through PT and see if she can do a better job with strengthening and stretches  She is doing her home exercises but not on an everyday basis  Pain is still deep in the buttocks around the piriformis area No real night pain since starting the amitriptyline She has been cutting the amitriptyline in half  Physical examination No acute distress BP 107/73  Ht 5\' 6"  (1.676 m)  Wt 126 lb (57.153 kg)  BMI 20.35 kg/m2  Exam is not repeated but she walks without limp and can do step ups without significant pain

## 2013-09-22 ENCOUNTER — Ambulatory Visit: Payer: BC Managed Care – PPO | Attending: Sports Medicine | Admitting: Physical Therapy

## 2013-09-22 DIAGNOSIS — M25559 Pain in unspecified hip: Secondary | ICD-10-CM | POA: Diagnosis not present

## 2013-09-24 ENCOUNTER — Encounter: Payer: BC Managed Care – PPO | Admitting: Physical Therapy

## 2013-09-30 ENCOUNTER — Ambulatory Visit: Payer: BC Managed Care – PPO | Admitting: Rehabilitation

## 2013-09-30 DIAGNOSIS — M25559 Pain in unspecified hip: Secondary | ICD-10-CM | POA: Diagnosis not present

## 2013-10-07 ENCOUNTER — Encounter: Payer: BC Managed Care – PPO | Admitting: Physical Therapy

## 2013-10-09 ENCOUNTER — Ambulatory Visit (INDEPENDENT_AMBULATORY_CARE_PROVIDER_SITE_OTHER): Payer: BC Managed Care – PPO | Admitting: *Deleted

## 2013-10-09 ENCOUNTER — Ambulatory Visit: Payer: BC Managed Care – PPO | Admitting: Rehabilitation

## 2013-10-09 DIAGNOSIS — Z2911 Encounter for prophylactic immunotherapy for respiratory syncytial virus (RSV): Secondary | ICD-10-CM

## 2013-10-09 DIAGNOSIS — Z23 Encounter for immunization: Secondary | ICD-10-CM

## 2013-10-14 ENCOUNTER — Ambulatory Visit (INDEPENDENT_AMBULATORY_CARE_PROVIDER_SITE_OTHER): Payer: BC Managed Care – PPO | Admitting: Sports Medicine

## 2013-10-14 ENCOUNTER — Encounter: Payer: Self-pay | Admitting: Sports Medicine

## 2013-10-14 VITALS — BP 111/66 | HR 63 | Ht 66.0 in | Wt 126.0 lb

## 2013-10-14 DIAGNOSIS — M25551 Pain in right hip: Secondary | ICD-10-CM

## 2013-10-14 DIAGNOSIS — M25559 Pain in unspecified hip: Secondary | ICD-10-CM

## 2013-10-14 NOTE — Patient Instructions (Signed)
Rachel Knight was c/o a popping sensation in the Right hip today. After review the patient previous MRI from 2013 she does have a small bone spur on the femoral head at the interior portion which is likely clicking over the inferior capsule. Patient has no pain with this issue. No significant arthritic changes to the hip.   Recommend:  No extremes of external rotation of the right hip  No frequent single leg weight bear on the right with hip rotation

## 2013-10-14 NOTE — Assessment & Plan Note (Signed)
Concerning patient's piriformis syndrome recommended continuing physical therapy and active lifestyle. Patient can stop using the amitriptyline as needed but is not tolerating the sedation and thus is probably not getting much clinical benefit.   Concern her anterior groin pain. Based on reviewing her exam and MRI results from 2013, I suspect that the bone spur on the femoral head that is clicking over the anterior and inferior joint capsule. Given this not causing any discomfort I assured patient that looking at her previous MRI there is no significant changes consistent with arthritis and I do not suspect sure need a total joint replacement in the future. Patient was reassured by this news. Advised her to continue physical activity with transition from physical therapy into a PT Pilates-type class. I do not think that this anterior popping sensation that will cause any future discomfort or problems

## 2013-10-14 NOTE — Progress Notes (Signed)
  Rachel Knight - 63 y.o. female MRN [id number]  Date of birth: 21-Jul-1950  SUBJECTIVE:  Including CC & ROS.  F/U Right hip pain and Piriformis syndrome  Patient returns having improved with PT feeling very good concerning her piriformis syndrome about 10 days ago. She is still able to continue her regular activity of swimming, paliates, and went on a hike over the weekend with soreness in the piriformis but able to tolerate pain with no difficulty completing activities.  She has not been using her tramadol and only uses a 1/2 dose of the amitriptyline on bad days.  She is doing her home exercises but not on an everyday basis  Pain is still deep in the buttocks around the piriformis area  No real night pain  Despite her piriformis improvement she reports a new problem. She reports a popping sensation that she feels on the anterior hip. This popping sensation is not cause any pain or discomfort but is startling and has been present for the past week. She is most concerned that she is developing hip arthitis and will need a hip replace like her mother. She did notice that this popping sensation did become exacerbated with increased activity this week including her baseline physical exercise that she does a weekly basis but also with on going out of 10 mild hike this past weekend. Again this is not causing her any discomfort just feels a popping sensation particularly with going upstairs and activity such as running.   ROS: Review of systems otherwise negative except for information present in HPI  HISTORY: Past Medical, Surgical, Social, and Family History Reviewed & Updated per EMR. Pertinent Historical Findings include: Vitamin D deficiency, osteopenia, family history significant for total hip replacement 22 arthritis in her mother  DATA REVIEWED: Personally reviewed a 2013 left hip MRI with contrast. On the coronal view where you can see bilateral hip joints. There is evidence of a small bone spur  on the distal inferior age of the femoral head with extension of the acetabulum in this region as well.  PHYSICAL EXAM:  VS: BP:111/66 mmHg  HR:63bpm  TEMP: ( )  RESP:   HT:5\' 6"  (167.6 cm)   WT:126 lb (57.153 kg)  BMI:20.4  PHYSICAL EXAM: HIP EXAM:  General: well nourished Skin of LE: warm; dry, no rashes, lesions, ecchymosis or erythema. Vascular: radial pulses 2+ bilaterally Neurologically: Sensation to light touch lower extremities equal and intact bilaterally.    Observation - no ecchymosis, edema, or hematoma present over the anterior, lateral, or posterior soft tissue surrounding the hip Palpation: No anterior hip joint tenderness ROM: Normal Hip motion in flexion, extension, internal and external rotation Muscle strength: No pain or weakness with iliopsoas flexion, rectus femoral flexion, hamstring and gluteal extension, hip adduction or abduction.  Patient does have an obvious popping sensation over the anterior hip with hip flexion and external rotation.  ASSESSMENT & PLAN: See problem based charting & AVS for pt instructions.

## 2013-10-15 ENCOUNTER — Ambulatory Visit: Payer: BC Managed Care – PPO | Attending: Sports Medicine

## 2013-10-15 DIAGNOSIS — M25559 Pain in unspecified hip: Secondary | ICD-10-CM | POA: Insufficient documentation

## 2013-10-31 ENCOUNTER — Ambulatory Visit: Payer: BC Managed Care – PPO | Admitting: Physical Therapy

## 2013-10-31 DIAGNOSIS — M25559 Pain in unspecified hip: Secondary | ICD-10-CM | POA: Diagnosis not present

## 2013-11-05 ENCOUNTER — Ambulatory Visit: Payer: BC Managed Care – PPO | Attending: Sports Medicine | Admitting: Rehabilitation

## 2013-11-05 DIAGNOSIS — M25559 Pain in unspecified hip: Secondary | ICD-10-CM | POA: Insufficient documentation

## 2013-11-11 ENCOUNTER — Ambulatory Visit: Payer: BC Managed Care – PPO | Admitting: Physical Therapy

## 2013-11-11 ENCOUNTER — Encounter: Payer: Self-pay | Admitting: Obstetrics and Gynecology

## 2013-11-11 DIAGNOSIS — M25559 Pain in unspecified hip: Secondary | ICD-10-CM | POA: Diagnosis not present

## 2013-11-18 ENCOUNTER — Ambulatory Visit: Payer: BC Managed Care – PPO | Admitting: Physical Therapy

## 2013-11-18 DIAGNOSIS — M25559 Pain in unspecified hip: Secondary | ICD-10-CM | POA: Diagnosis not present

## 2013-12-01 ENCOUNTER — Ambulatory Visit: Payer: BC Managed Care – PPO | Admitting: Physical Therapy

## 2013-12-01 DIAGNOSIS — M25559 Pain in unspecified hip: Secondary | ICD-10-CM | POA: Diagnosis not present

## 2013-12-02 ENCOUNTER — Other Ambulatory Visit (INDEPENDENT_AMBULATORY_CARE_PROVIDER_SITE_OTHER): Payer: BC Managed Care – PPO

## 2013-12-02 ENCOUNTER — Telehealth: Payer: Self-pay | Admitting: Internal Medicine

## 2013-12-02 DIAGNOSIS — R300 Dysuria: Secondary | ICD-10-CM

## 2013-12-02 LAB — URINALYSIS, ROUTINE W REFLEX MICROSCOPIC
BILIRUBIN URINE: NEGATIVE
Hgb urine dipstick: NEGATIVE
Ketones, ur: NEGATIVE
NITRITE: NEGATIVE
PH: 5.5 (ref 5.0–8.0)
Specific Gravity, Urine: 1.015 (ref 1.000–1.030)
TOTAL PROTEIN, URINE-UPE24: NEGATIVE
Urine Glucose: NEGATIVE
Urobilinogen, UA: 0.2 (ref 0.0–1.0)

## 2013-12-02 NOTE — Telephone Encounter (Signed)
CCU for UA & C&S  dysuria

## 2013-12-02 NOTE — Telephone Encounter (Signed)
Orders placed and patient notified via voicemail

## 2013-12-02 NOTE — Telephone Encounter (Signed)
Patient believes she has another UTI.  She states she has issues with scheduling an appointment b/c she has an art show opening up Thursday.  She would like to know if she could get lab orders placed only.  Please advise.

## 2013-12-04 ENCOUNTER — Encounter: Payer: Self-pay | Admitting: Internal Medicine

## 2013-12-04 LAB — URINE CULTURE

## 2013-12-08 ENCOUNTER — Encounter: Payer: BC Managed Care – PPO | Admitting: Physical Therapy

## 2014-01-05 ENCOUNTER — Encounter: Payer: Self-pay | Admitting: Sports Medicine

## 2014-01-21 ENCOUNTER — Encounter: Payer: Self-pay | Admitting: Obstetrics and Gynecology

## 2014-01-21 ENCOUNTER — Ambulatory Visit: Payer: 59 | Admitting: Obstetrics and Gynecology

## 2014-01-21 ENCOUNTER — Ambulatory Visit (INDEPENDENT_AMBULATORY_CARE_PROVIDER_SITE_OTHER): Payer: BC Managed Care – PPO | Admitting: Obstetrics and Gynecology

## 2014-01-21 VITALS — BP 100/66 | HR 64 | Resp 16 | Ht 66.25 in | Wt 127.6 lb

## 2014-01-21 DIAGNOSIS — E038 Other specified hypothyroidism: Secondary | ICD-10-CM

## 2014-01-21 DIAGNOSIS — Z0000 Encounter for general adult medical examination without abnormal findings: Secondary | ICD-10-CM

## 2014-01-21 DIAGNOSIS — Z01419 Encounter for gynecological examination (general) (routine) without abnormal findings: Secondary | ICD-10-CM

## 2014-01-21 LAB — POCT URINALYSIS DIPSTICK
Bilirubin, UA: NEGATIVE
Glucose, UA: NEGATIVE
Ketones, UA: NEGATIVE
Leukocytes, UA: NEGATIVE
Nitrite, UA: NEGATIVE
PH UA: 5
Protein, UA: NEGATIVE
RBC UA: NEGATIVE
UROBILINOGEN UA: NEGATIVE

## 2014-01-21 LAB — COMPREHENSIVE METABOLIC PANEL
ALK PHOS: 49 U/L (ref 39–117)
ALT: 15 U/L (ref 0–35)
AST: 18 U/L (ref 0–37)
Albumin: 4.2 g/dL (ref 3.5–5.2)
BUN: 17 mg/dL (ref 6–23)
CO2: 26 meq/L (ref 19–32)
CREATININE: 0.87 mg/dL (ref 0.50–1.10)
Calcium: 9.1 mg/dL (ref 8.4–10.5)
Chloride: 105 mEq/L (ref 96–112)
Glucose, Bld: 85 mg/dL (ref 70–99)
Potassium: 4.4 mEq/L (ref 3.5–5.3)
Sodium: 138 mEq/L (ref 135–145)
TOTAL PROTEIN: 6.4 g/dL (ref 6.0–8.3)
Total Bilirubin: 0.9 mg/dL (ref 0.2–1.2)

## 2014-01-21 LAB — LIPID PANEL
Cholesterol: 148 mg/dL (ref 0–200)
HDL: 61 mg/dL (ref >39)
LDL Cholesterol: 73 mg/dL (ref 0–99)
TRIGLYCERIDES: 71 mg/dL (ref <150)
Total CHOL/HDL Ratio: 2.4 Ratio
VLDL: 14 mg/dL (ref 0–40)

## 2014-01-21 LAB — THYROID PANEL WITH TSH
FREE THYROXINE INDEX: 1.9 (ref 1.4–3.8)
T3 Uptake: 30 % (ref 22–35)
T4, Total: 6.2 ug/dL (ref 4.5–12.0)
TSH: 1.671 u[IU]/mL (ref 0.350–4.500)

## 2014-01-21 MED ORDER — CITALOPRAM HYDROBROMIDE 20 MG PO TABS: ORAL_TABLET | ORAL | Status: DC

## 2014-01-21 MED ORDER — LEVOTHYROXINE SODIUM 75 MCG PO TABS: 75.0000 ug | ORAL_TABLET | Freq: Every day | ORAL | Status: DC

## 2014-01-21 NOTE — Patient Instructions (Signed)

## 2014-01-21 NOTE — Progress Notes (Signed)
Patient ID: Rachel Knight, female   DOB: 01/17/1951, 63 y.o.   MRN: [id number] 63 y.o. [id number] MarriedCaucasianF here for annual exam.  Feels great doing Pilates.    Needs refill of Levoxyl and Citalopram.  Doing well on these.   Using Vagifem rarely.   Has fever blisters and takes Valtrex.  PCP has provided this in the past.  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 Had many years ago and nothing since then.  Patient denies a history of substance abuse.  Will need to correct her medical record.   PCP:  Unice Cobble, MD  Patient's last menstrual period was 06/05/2006.          Sexually active: Yes.  female  The current method of family planning is post menopausal status.    Exercising: Yes.    walking, yoga, pilates and swimming. Smoker:  no  Health Maintenance: Pap:  01-20-13 wnl:neg HRHPV History of abnormal Pap:  no MMG:  03-25-13 Implants/fibroglandular density/wnl:[facility] Colonoscopy:  07/2005 wnl:Dr. Delfin Edis.  Next colonoscopy due 5-7 years per patient. (Family history of colon polyps.) BMD:   03/2013 - osteopenia:[facility] TDaP:  2010 Screening Labs:   Hb today: PCP, Urine today: Neg   reports that she has quit smoking. She has never used smokeless tobacco. She reports that she drinks about 6.0 oz of alcohol per week. She reports that she does not use illicit drugs.  Past Medical History  Diagnosis Date  . Perennial allergic rhinitis   . Osteopenia   . Hypothyroidism   . Anxiety   . Depression   . 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  Marland Kitchen HSV-1 infection     Past Surgical History  Procedure Laterality Date  . Breast enhancement surgery  1996  . Colonoscopy  2000 , 2007    Dr Olevia Perches. Due 2014  . Appendectomy  2011  . Augmentation mammaplasty      Current Outpatient Prescriptions  Medication Sig Dispense Refill  . Ascorbic Acid (VITAMIN C) 1000 MG tablet Take 1,000 mg by mouth daily.    . Calcium Carbonate-Vitamin D (CALCIUM-VITAMIN D) 500-200 MG-UNIT  per tablet Take 1 tablet by mouth daily.     . citalopram (CELEXA) 20 MG tablet TAKE 1 TABLET BY MOUTH DAILY 30 tablet 11  . cyanocobalamin 500 MCG tablet Take 500 mcg by mouth daily.    Marland Kitchen ipratropium (ATROVENT) 0.06 % nasal spray Place 2 sprays into the nose 2 (two) times daily as needed. For allergy related sinus relief    . levothyroxine (LEVOXYL) 75 MCG tablet Take 1 tablet (75 mcg total) by mouth daily. 30 tablet 11  . Omega-3 Fatty Acids (FISH OIL) 1000 MG CAPS Take by mouth daily.    Marland Kitchen omeprazole (PRILOSEC) 20 MG capsule     . Probiotic Product (PROBIOTIC DAILY PO) Take 400 mg by mouth daily.    . valACYclovir (VALTREX) 500 MG tablet      No current facility-administered medications for this visit.    Family History  Problem Relation Age of Onset  . Stroke Father 6  . Colon polyps Father   . Other Brother 45    OS muscle paralysis , resolution over 3 days. Neg evaluation  . Colon cancer Paternal Grandfather   . Coronary artery disease Maternal Grandmother 63  . Colon cancer Paternal Grandmother   . Coronary artery disease Maternal Grandfather 60  . Diabetes Neg Hx   . Breast cancer Mother 15  . Thyroid  disease Mother     ROS:  Pertinent items are noted in HPI.  Otherwise, a comprehensive ROS was negative.  Exam:   BP 100/66 mmHg  Pulse 64  Resp 16  Ht 5' 6.25" (1.683 m)  Wt 127 lb 9.6 oz (57.879 kg)  BMI 20.43 kg/m2  LMP 06/05/2006      Height: 5' 6.25" (168.3 cm)  Ht Readings from Last 3 Encounters:  01/21/14 5' 6.25" (1.683 m)  10/14/13 5\' 6"  (1.676 m)  08/21/13 5\' 6"  (1.676 m)    General appearance: alert, cooperative and appears stated age Head: Normocephalic, without obvious abnormality, atraumatic Neck: no adenopathy, supple, symmetrical, trachea midline and thyroid normal to inspection and palpation Lungs: clear to auscultation bilaterally Breasts: normal appearance, no masses or tenderness, Inspection negative, No nipple retraction or dimpling, No  nipple discharge or bleeding, No axillary or supraclavicular adenopathy, Bilateral implants. Heart: regular rate and rhythm Abdomen: soft, non-tender; bowel sounds normal; no masses,  no organomegaly Extremities: extremities normal, atraumatic, no cyanosis or edema Skin: Skin color, texture, turgor normal. No rashes or lesions Lymph nodes: Cervical, supraclavicular, and axillary nodes normal. No abnormal inguinal nodes palpated Neurologic: Grossly normal   Pelvic: External genitalia:  no lesions              Urethra:  normal appearing urethra with no masses, tenderness or lesions              Bartholins and Skenes: normal                 Vagina: normal appearing vagina with normal color and discharge, no lesions              Cervix: no lesions              Pap taken: No. Bimanual Exam:  Uterus:  normal size, contour, position, consistency, mobility, non-tender              Adnexa: normal adnexa and no mass, fullness, tenderness               Rectovaginal: Confirms               Anus:  normal sphincter tone, no lesions  A:  Well Woman with normal exam Osteopenia.   Bilateral breast implants.  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  Need for correction of medical record.  Hypothyroidism.   P:   Mammogram yearly.  pap smear not indicated.  TFTs and cholesterol and CMP per request.  Refill of Synthroid and Citalopram.  Will correct medical record.  Bone density in 2018.   return annually or prn  An After Visit Summary was printed and given to the patient.

## 2014-06-07 ENCOUNTER — Encounter: Payer: Self-pay | Admitting: Internal Medicine

## 2014-06-09 ENCOUNTER — Encounter: Payer: Self-pay | Admitting: Internal Medicine

## 2014-06-09 ENCOUNTER — Ambulatory Visit (INDEPENDENT_AMBULATORY_CARE_PROVIDER_SITE_OTHER): Payer: BLUE CROSS/BLUE SHIELD | Admitting: Internal Medicine

## 2014-06-09 VITALS — BP 98/70 | HR 87 | Temp 98.3°F | Ht 66.0 in | Wt 127.5 lb

## 2014-06-09 DIAGNOSIS — J209 Acute bronchitis, unspecified: Secondary | ICD-10-CM

## 2014-06-09 MED ORDER — LEVOFLOXACIN 500 MG PO TABS: 500.0000 mg | ORAL_TABLET | Freq: Every day | ORAL | Status: DC

## 2014-06-09 MED ORDER — HYDROCODONE-HOMATROPINE 5-1.5 MG/5ML PO SYRP: 5.0000 mL | ORAL_SOLUTION | Freq: Four times a day (QID) | ORAL | Status: DC | PRN

## 2014-06-09 NOTE — Progress Notes (Signed)
   Subjective:    Patient ID: Rachel Knight, female    DOB: 07/07/1950, 64 y.o.   MRN: [id number]  HPI  She was treated for presumed flu with Tamiflu. As of 4/1 she developed fever and nonproductive cough. As of 4/2 sputum changed from being clear to yellow gray. The low-grade fever resolved after 4/2. She is now having the yellow gray secretions from both the chest and head; greater volume from the chest. She's also had laryngitis.  Review of Systems  She's had no extrinsic symptoms. She does not have frontal headache, facial pain, wheezing, shortness of breath, otic pain, otic discharge. She also denies dental pain or sore throat at this time although this was present previously.     Objective:   Physical Exam  Pertinent or positive findings include : She appears fatigued and mildly ill. There is erythema of the external nose.  There is also erythema of the nasal mucosa.  She has a paroxysmal nonproductive cough.  General appearance:Thin but adequately nourished; no acute distress or increased work of breathing is present.   Lymphatic: No  lymphadenopathy about the head, neck, or axilla . Eyes: No conjunctival inflammation or lid edema is present. There is no scleral icterus. Ears:  External ear exam shows no significant lesions or deformities.  Otoscopic examination reveals clear canals, tympanic membranes are intact bilaterally without bulging, retraction, inflammation or discharge. Nose:  No septal dislocation or deviation.No obstruction to airflow.  Oral exam: Dental hygiene is good; lips and gums are healthy appearing.There is no oropharyngeal erythema or exudate  Neck:  No deformities, thyromegaly, masses, or tenderness noted.   Supple with full range of motion without pain.  Heart:  Normal rate and regular rhythm. S1 and S2 normal without gallop, murmur, click, rub or other extra sounds.  Lungs:Chest clear to auscultation; no wheezes, rhonchi,rales ,or rubs  present. Extremities:  No cyanosis, edema, or clubbing  noted  Skin: Warm & dry w/o tenting or jaundice. No significant lesions or rash.      Assessment & Plan:  #1 acute bronchitis w/o bronchospasm #2 URI, acute Plan: See orders and recommendations

## 2014-06-09 NOTE — Progress Notes (Signed)
Pre visit review using our clinic review tool, if applicable. No additional management support is needed unless otherwise documented below in the visit note. 

## 2014-06-09 NOTE — Patient Instructions (Signed)
The serotonin reuptake inhibitor (citalopram) should be decreased by half dose while on Levaquin huch  could increase the relative dose of the serotonin reuptake inhibitor as they would both be metabolized through the liver.  Plain Mucinex (NOT D) for thick secretions ;force NON dairy fluids .   Nasal cleansing in the shower as discussed with lather of mild shampoo.After 10 seconds wash off lather while  exhaling through nostrils. Make sure that all residual soap is removed to prevent irritation.  Flonase OR Nasacort AQ 1 spray in each nostril twice a day as needed. Use the "crossover" technique into opposite nostril spraying toward opposite ear @ 45 degree angle, not straight up into nostril.  Plain Allegra (NOT D )  160 daily , Loratidine 10 mg , OR Zyrtec 10 mg @ bedtime  as needed for itchy eyes & sneezing.

## 2014-07-20 ENCOUNTER — Ambulatory Visit (INDEPENDENT_AMBULATORY_CARE_PROVIDER_SITE_OTHER): Payer: BLUE CROSS/BLUE SHIELD | Admitting: Internal Medicine

## 2014-07-20 ENCOUNTER — Encounter: Payer: Self-pay | Admitting: Internal Medicine

## 2014-07-20 ENCOUNTER — Other Ambulatory Visit (INDEPENDENT_AMBULATORY_CARE_PROVIDER_SITE_OTHER): Payer: BLUE CROSS/BLUE SHIELD

## 2014-07-20 VITALS — BP 102/70 | HR 61 | Temp 97.5°F | Wt 124.5 lb

## 2014-07-20 DIAGNOSIS — Z0100 Encounter for examination of eyes and vision without abnormal findings: Secondary | ICD-10-CM | POA: Diagnosis not present

## 2014-07-20 DIAGNOSIS — Z0189 Encounter for other specified special examinations: Secondary | ICD-10-CM | POA: Diagnosis not present

## 2014-07-20 DIAGNOSIS — Z0000 Encounter for general adult medical examination without abnormal findings: Secondary | ICD-10-CM

## 2014-07-20 DIAGNOSIS — M899 Disorder of bone, unspecified: Secondary | ICD-10-CM

## 2014-07-20 LAB — VITAMIN D 25 HYDROXY (VIT D DEFICIENCY, FRACTURES): VITD: 30.11 ng/mL (ref 30.00–100.00)

## 2014-07-20 MED ORDER — VALACYCLOVIR HCL 500 MG PO TABS: 500.0000 mg | ORAL_TABLET | Freq: Every day | ORAL | Status: DC

## 2014-07-20 NOTE — Progress Notes (Signed)
Subjective:    Patient ID: Rachel Knight, female    DOB: 02/18/1951, 64 y.o.   MRN: [id number]  HPI  She is here for a physical;acute issues denied.  She is on a heart healthy diet. She exercises 5 hours a week as Pilates as well as treadmill without cardiovascular symptoms.  She has been compliant with her medications without adverse effects.   Colonoscopy is due next year. She has no active GI symptoms  Citalopram is very effective, basically "keeping me calm".     Review of Systems  Chest pain, palpitations, tachycardia, exertional dyspnea, paroxysmal nocturnal dyspnea, claudication or edema are absent.  No significant change in weight; significant fatigue; sleep disorder; change in appetite. No blurred, double ,loss of vision No constipation; diarrhea;hoarseness;dysphagia No change in nails,hair,skin No numbness or tingling; tremor No anxiety; depression; panic attacks No temperature intolerance to heat ,cold Abdominal pain, significant dyspepsia, dysphagia, melena, rectal bleeding, or persistently small caliber stools are denied.     Objective:   Physical Exam  Gen.: Thin but adequately nourished in appearance. Alert, appropriate and cooperative throughout exam. : Appears younger than stated age  Head: Normocephalic without obvious abnormalities  Eyes: No corneal or conjunctival inflammation noted. Pupils equal round reactive to light and accommodation. Extraocular motion intact.  Ears: External  ear exam reveals no significant lesions or deformities. Canals clear .TMs normal. Hearing is grossly normal bilaterally. Nose: External nasal exam reveals no deformity or inflammation. Nasal mucosa are pink and moist. No lesions or exudates noted.   Mouth: Oral mucosa and oropharynx reveal no lesions or exudates. Teeth in good repair. Neck: No deformities, masses, or tenderness noted. Range of motion & Thyroid normal Lungs: Normal respiratory effort; chest expands  symmetrically. Lungs are clear to auscultation without rales, wheezes, or increased work of breathing. Heart: Normal rate and rhythm. Normal S1 and S2. No gallop, click, or rub. No murmur. Abdomen: Bowel sounds normal; abdomen soft and nontender. No masses, organomegaly or hernias noted. Genitalia:  as per Gyn                                  Musculoskeletal/extremities: No deformity or scoliosis noted of  the thoracic or lumbar spine.  No clubbing, cyanosis, edema, or significant extremity  deformity noted.  Range of motion normal . Tone & strength normal. Hand joints normal  Fingernail  health good. Minor crepitus of knees  Able to lie down & sit up w/o help.  Negative SLR bilaterally Vascular: Carotid, radial artery, dorsalis pedis and  posterior tibial pulses are full and equal. No bruits present. Neurologic: Alert and oriented x3. Deep tendon reflexes symmetrical and normal.  Gait normal       Skin: Intact without suspicious lesions or rashes. Lymph: No cervical, axillary lymphadenopathy present. Psych: Mood and affect are normal. Normally interactive                                                                                      Assessment & Plan:  #1 comprehensive physical exam; no acute findings  Plan: see Orders  &  Recommendations

## 2014-07-20 NOTE — Progress Notes (Signed)
Pre visit review using our clinic review tool, if applicable. No additional management support is needed unless otherwise documented below in the visit note. 

## 2014-07-20 NOTE — Patient Instructions (Signed)
  Your next office appointment will be determined based upon review of your pending labs.  Those instructions will be transmitted to you by My Chart    Critical results will be called.   Followup as needed for any active or acute issue. Please report any significant change in your symptoms. 

## 2015-01-22 ENCOUNTER — Other Ambulatory Visit: Payer: Self-pay

## 2015-01-22 MED ORDER — CITALOPRAM HYDROBROMIDE 20 MG PO TABS: ORAL_TABLET | ORAL | Status: DC

## 2015-01-22 MED ORDER — LEVOTHYROXINE SODIUM 75 MCG PO TABS: 75.0000 ug | ORAL_TABLET | Freq: Every day | ORAL | Status: DC

## 2015-01-22 NOTE — Telephone Encounter (Signed)
Medication refill request: Levothyroxine and Celexa Last AEX:  01-22-2015 Dr. Quincy Simmonds  Next AEX: 02-12-2015 Dr. Quincy Simmonds Last MMG (if hormonal medication request): 03-25-2013 BIRADS 1  Refill authorized: Levothyroxine 30 tabs 11 Refills 01-21-2014  / Celexa 30 tabs 11 Refills 01-21-2014 Today 30 tabs 0 Refills ?

## 2015-01-25 NOTE — Telephone Encounter (Signed)
Faxed Rx for Celexa 20mg  #30 zero refills to [facility] Fax [phone].

## 2015-02-05 ENCOUNTER — Other Ambulatory Visit: Payer: Self-pay | Admitting: Neurology

## 2015-02-05 MED ORDER — OMEPRAZOLE 20 MG PO CPDR: 20.0000 mg | DELAYED_RELEASE_CAPSULE | Freq: Every day | ORAL | Status: DC

## 2015-02-11 ENCOUNTER — Encounter: Payer: Self-pay | Admitting: *Deleted

## 2015-02-12 ENCOUNTER — Ambulatory Visit (INDEPENDENT_AMBULATORY_CARE_PROVIDER_SITE_OTHER): Payer: BLUE CROSS/BLUE SHIELD | Admitting: Obstetrics and Gynecology

## 2015-02-12 ENCOUNTER — Encounter: Payer: Self-pay | Admitting: Obstetrics and Gynecology

## 2015-02-12 ENCOUNTER — Telehealth: Payer: Self-pay | Admitting: Obstetrics and Gynecology

## 2015-02-12 VITALS — BP 100/62 | HR 60 | Resp 16 | Ht 66.0 in | Wt 129.0 lb

## 2015-02-12 DIAGNOSIS — Z01419 Encounter for gynecological examination (general) (routine) without abnormal findings: Secondary | ICD-10-CM

## 2015-02-12 DIAGNOSIS — E559 Vitamin D deficiency, unspecified: Secondary | ICD-10-CM

## 2015-02-12 DIAGNOSIS — Z0000 Encounter for general adult medical examination without abnormal findings: Secondary | ICD-10-CM

## 2015-02-12 LAB — LIPID PANEL
CHOL/HDL RATIO: 2.5 ratio (ref <=5.0)
CHOLESTEROL: 174 mg/dL (ref 125–200)
HDL: 71 mg/dL (ref >=46)
LDL Cholesterol: 93 mg/dL (ref <130)
TRIGLYCERIDES: 52 mg/dL (ref <150)
VLDL: 10 mg/dL (ref <30)

## 2015-02-12 LAB — POCT URINALYSIS DIPSTICK
Bilirubin, UA: NEGATIVE
Blood, UA: NEGATIVE
Glucose, UA: NEGATIVE
Ketones, UA: NEGATIVE
Leukocytes, UA: NEGATIVE
NITRITE UA: NEGATIVE
PH UA: 5
Protein, UA: NEGATIVE
UROBILINOGEN UA: NEGATIVE

## 2015-02-12 LAB — COMPREHENSIVE METABOLIC PANEL
ALK PHOS: 57 U/L (ref 33–130)
ALT: 15 U/L (ref 6–29)
AST: 16 U/L (ref 10–35)
Albumin: 4.4 g/dL (ref 3.6–5.1)
BILIRUBIN TOTAL: 0.9 mg/dL (ref 0.2–1.2)
BUN: 21 mg/dL (ref 7–25)
CALCIUM: 9.5 mg/dL (ref 8.6–10.4)
CO2: 25 mmol/L (ref 20–31)
Chloride: 106 mmol/L (ref 98–110)
Creat: 0.81 mg/dL (ref 0.50–0.99)
Glucose, Bld: 82 mg/dL (ref 65–99)
POTASSIUM: 4.3 mmol/L (ref 3.5–5.3)
Sodium: 140 mmol/L (ref 135–146)
TOTAL PROTEIN: 6.7 g/dL (ref 6.1–8.1)

## 2015-02-12 LAB — CBC
HCT: 42.9 % (ref 36.0–46.0)
HEMOGLOBIN: 14.4 g/dL (ref 12.0–15.0)
MCH: 31.4 pg (ref 26.0–34.0)
MCHC: 33.6 g/dL (ref 30.0–36.0)
MCV: 93.7 fL (ref 78.0–100.0)
MPV: 9.5 fL (ref 8.6–12.4)
PLATELETS: 251 10*3/uL (ref 150–400)
RBC: 4.58 MIL/uL (ref 3.87–5.11)
RDW: 12.8 % (ref 11.5–15.5)
WBC: 4.7 10*3/uL (ref 4.0–10.5)

## 2015-02-12 LAB — VITAMIN D 25 HYDROXY (VIT D DEFICIENCY, FRACTURES): VIT D 25 HYDROXY: 36 ng/mL (ref 30–100)

## 2015-02-12 LAB — THYROID PANEL WITH TSH
Free Thyroxine Index: 3 (ref 1.4–3.8)
T3 Uptake: 36 % — ABNORMAL HIGH (ref 22–35)
T4, Total: 8.2 ug/dL (ref 4.5–12.0)
TSH: 2.731 u[IU]/mL (ref 0.350–4.500)

## 2015-02-12 MED ORDER — CITALOPRAM HYDROBROMIDE 20 MG PO TABS: ORAL_TABLET | ORAL | Status: DC

## 2015-02-12 MED ORDER — LEVOTHYROXINE SODIUM 75 MCG PO TABS: 75.0000 ug | ORAL_TABLET | Freq: Every day | ORAL | Status: DC

## 2015-02-12 NOTE — Telephone Encounter (Signed)
Patient was seen today for AEX and Dr. Quincy Simmonds gave her information on [facility] with the recommendations of getting a mammogram and bone density done. Patient says that she talked with [facility] and scheduled bone density for 04/07/2015 at 8:30 and they need a order faxed to them before her appointment. Best # to reach patient: [phone] for any questions

## 2015-02-12 NOTE — Telephone Encounter (Signed)
Order for BMD and screening mammogram to Dr.Silva for review and signature prior to faxing.

## 2015-02-12 NOTE — Progress Notes (Signed)
Patient ID: Rachel Knight, female   DOB: [birth year], 64 y.o.   MRN: [id number] 64 y.o. G34P2002 Married Caucasian female here for annual exam.    Will start Omeprazole for reflux.   Husband has prostate cancer and has surgery next week.   Mother with breast cancer at age 11 yo.  PCP:  Billey Gosling, MD  Patient's last menstrual period was 06/05/2006 (approximate).          Sexually active: Yes.   female The current method of family planning is post menopausal status.    Exercising: Yes.    pilates,yoga,walking and weights. Smoker:  no  Health Maintenance: Pap:  01-10-13 Neg:Neg HR HPV History of abnormal Pap:  no MMG:  03-25-13 Implants,Density Cat.B/Neg/BiRads1:[facility]. Patient states will call to schedule appointment. Colonoscopy:  07/2005 normal with Dr. Sydell Axon Brodie;next due 07/2015. BMD:   03-25-13   Result  Osteopenia:[facility] TDaP:  2010 Screening Labs:  Hb today: PCP, Urine today: Neg   reports that she has quit smoking. She has never used smokeless tobacco. She reports that she drinks about 4.2 - 8.4 oz of alcohol per week. She reports that she does not use illicit drugs.  Past Medical History  Diagnosis Date  . Perennial allergic rhinitis   . Osteopenia   . Hypothyroidism   . Anxiety   . Depression   . 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  Marland Kitchen HSV-1 infection     Past Surgical History  Procedure Laterality Date  . Breast enhancement surgery  1996  . Colonoscopy  2000 , 2007    Dr Olevia Perches. Due 2017  . Appendectomy  2011  . Augmentation mammaplasty      Current Outpatient Prescriptions  Medication Sig Dispense Refill  . acetaminophen (TYLENOL) 500 MG tablet Take 1,000 mg by mouth every 6 (six) hours as needed for mild pain.    . Ascorbic Acid (VITAMIN C) 1000 MG tablet Take 1,000 mg by mouth daily.    . Calcium Carbonate-Vitamin D (CALCIUM-VITAMIN D) 500-200 MG-UNIT per tablet Take 1 tablet by mouth daily.     . citalopram (CELEXA) 20 MG tablet TAKE 1  TABLET BY MOUTH DAILY 30 tablet 0  . ipratropium (ATROVENT) 0.06 % nasal spray Place 2 sprays into the nose 2 (two) times daily as needed. For allergy related sinus relief    . levothyroxine (LEVOXYL) 75 MCG tablet Take 1 tablet (75 mcg total) by mouth daily. 30 tablet 0  . Omega-3 Fatty Acids (FISH OIL) 1000 MG CAPS Take by mouth daily.    . valACYclovir (VALTREX) 500 MG tablet Take 1 tablet (500 mg total) by mouth daily. (Patient taking differently: Take 500 mg by mouth as needed. ) 90 tablet 3   No current facility-administered medications for this visit.    Family History  Problem Relation Age of Onset  . Stroke Father 36  . Colon polyps Father   . Other Brother 45    OS muscle paralysis , resolution over 3 days. Neg evaluation  . Colon cancer Paternal Grandfather   . Coronary artery disease Maternal Grandmother 63  . Colon cancer Paternal Grandmother   . Coronary artery disease Maternal Grandfather 60  . Diabetes Neg Hx   . Breast cancer Mother 90  . Thyroid disease Mother     ROS:  Pertinent items are noted in HPI.  Otherwise, a comprehensive ROS was negative.  Exam:   BP 100/62 mmHg  Pulse 60  Resp 16  Ht 5\' 6"  (1.676 m)  Wt 129 lb (58.514 kg)  BMI 20.83 kg/m2  LMP 06/05/2006 (Approximate)    General appearance: alert, cooperative and appears stated age Head: Normocephalic, without obvious abnormality, atraumatic Neck: no adenopathy, supple, symmetrical, trachea midline and thyroid normal to inspection and palpation Lungs: clear to auscultation bilaterally Breasts: normal appearance, no masses or tenderness, Inspection negative, No nipple retraction or dimpling, No nipple discharge or bleeding, No axillary or supraclavicular adenopathy.  Bilateral implants.  Heart: regular rate and rhythm Abdomen: soft, non-tender; bowel sounds normal; no masses,  no organomegaly Extremities: extremities normal, atraumatic, no cyanosis or edema Skin: Skin color, texture, turgor  normal. No rashes or lesions Lymph nodes: Cervical, supraclavicular, and axillary nodes normal. No abnormal inguinal nodes palpated Neurologic: Grossly normal  Pelvic: External genitalia:  no lesions              Urethra:  normal appearing urethra with no masses, tenderness or lesions              Bartholins and Skenes: normal                 Vagina: normal appearing vagina with normal color and discharge, no lesions              Cervix: no lesions              Pap taken: No. Bimanual Exam:  Uterus:  normal size, contour, position, consistency, mobility, non-tender              Adnexa: normal adnexa and no mass, fullness, tenderness              Rectovaginal: Yes.  .  Confirms.              Anus:  normal sphincter tone, no lesions  Chaperone was present for exam.  Assessment:   Well woman visit with normal exam. Osteopenia.  Bilateral breast implants.  FH of breast cancer.  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  Hypothyroidism.   Plan: Yearly mammogram recommended after age 17.  Patient will schedule mammogram and bone density.  Recommended self breast exam.  Pap and HR HPV as above. Discussed Calcium, Vitamin D, regular exercise program including cardiovascular and weight bearing exercise. Labs performed.  Yes.  .   See orders. Refills given on medications.  Yes.  .  See orders.  Citalopram and Synthroid.  Follow up annually and prn.      After visit summary provided.

## 2015-02-12 NOTE — Telephone Encounter (Signed)
Spoke with patient. Advised order has been faxed to [facility] for BMD and mammogram appointment. Patient is agreeabl.  Routing to provider for final review. Patient agreeable to disposition. Will close encounter.

## 2015-02-12 NOTE — Patient Instructions (Signed)

## 2015-02-12 NOTE — Telephone Encounter (Signed)
Order signed.

## 2015-02-12 NOTE — Telephone Encounter (Signed)
Left message to call [facility] at [phone].  Order for screening mammogram and BMD faxed to [facility] with cover sheet and confirmation.

## 2015-02-12 NOTE — Telephone Encounter (Signed)
Patient returning call.

## 2015-02-17 ENCOUNTER — Ambulatory Visit (INDEPENDENT_AMBULATORY_CARE_PROVIDER_SITE_OTHER): Payer: BLUE CROSS/BLUE SHIELD | Admitting: *Deleted

## 2015-02-17 DIAGNOSIS — I781 Nevus, non-neoplastic: Secondary | ICD-10-CM

## 2015-02-17 NOTE — Progress Notes (Signed)
   Cutaneous Laser:pulsed mode  814j/cm2 400 ms delay  13 ms Duration 0.5 spot  Total pulses: 389 Total energy 620.61  Total time::05  Photos: No.  Compression stockings applied: No.NA  Pt had a few scattered red capillaries. Too small to inject. Increased the jules to 814. Vessels appeared to respond well. Will follow prn.

## 2015-02-23 ENCOUNTER — Other Ambulatory Visit: Payer: Self-pay | Admitting: *Deleted

## 2015-02-23 MED ORDER — IPRATROPIUM BROMIDE 0.06 % NA SOLN: 2.0000 | Freq: Two times a day (BID) | NASAL | Status: DC | PRN

## 2015-02-24 ENCOUNTER — Encounter: Payer: Self-pay | Admitting: Vascular Surgery

## 2015-03-29 ENCOUNTER — Encounter: Payer: Self-pay | Admitting: Internal Medicine

## 2015-04-19 ENCOUNTER — Telehealth: Payer: Self-pay

## 2015-04-19 NOTE — Telephone Encounter (Signed)
Called patient to discuss BMD results per Dr. Quincy Simmonds.  Advised had Osteopenia of hip & Spine.  Risk of fracture is still low enough that treatment is not needed.  Needs to repeat study in 2 years, continue with weight bearing exercise and calcium w/Vitamin D3.  Patient verbalizes understanding and is currently doing all of these recommendations.  She will call if any questions or problems. Will scan in Bone Density report from [facility].

## 2015-07-01 ENCOUNTER — Encounter: Payer: Self-pay | Admitting: Gastroenterology

## 2015-07-21 ENCOUNTER — Encounter: Payer: Self-pay | Admitting: Internal Medicine

## 2015-07-21 ENCOUNTER — Ambulatory Visit (INDEPENDENT_AMBULATORY_CARE_PROVIDER_SITE_OTHER): Payer: BLUE CROSS/BLUE SHIELD | Admitting: Internal Medicine

## 2015-07-21 ENCOUNTER — Other Ambulatory Visit (INDEPENDENT_AMBULATORY_CARE_PROVIDER_SITE_OTHER): Payer: BLUE CROSS/BLUE SHIELD

## 2015-07-21 VITALS — BP 96/64 | HR 58 | Temp 97.6°F | Resp 16 | Ht 66.0 in | Wt 128.0 lb

## 2015-07-21 DIAGNOSIS — Z0000 Encounter for general adult medical examination without abnormal findings: Secondary | ICD-10-CM

## 2015-07-21 DIAGNOSIS — E559 Vitamin D deficiency, unspecified: Secondary | ICD-10-CM | POA: Diagnosis not present

## 2015-07-21 DIAGNOSIS — Z1159 Encounter for screening for other viral diseases: Secondary | ICD-10-CM

## 2015-07-21 DIAGNOSIS — Z114 Encounter for screening for human immunodeficiency virus [HIV]: Secondary | ICD-10-CM

## 2015-07-21 DIAGNOSIS — B002 Herpesviral gingivostomatitis and pharyngotonsillitis: Secondary | ICD-10-CM

## 2015-07-21 DIAGNOSIS — M8580 Other specified disorders of bone density and structure, unspecified site: Secondary | ICD-10-CM | POA: Diagnosis not present

## 2015-07-21 DIAGNOSIS — E038 Other specified hypothyroidism: Secondary | ICD-10-CM

## 2015-07-21 DIAGNOSIS — M25551 Pain in right hip: Secondary | ICD-10-CM

## 2015-07-21 DIAGNOSIS — Z1211 Encounter for screening for malignant neoplasm of colon: Secondary | ICD-10-CM

## 2015-07-21 LAB — TSH: TSH: 2.12 u[IU]/mL (ref 0.35–4.50)

## 2015-07-21 MED ORDER — OMEPRAZOLE 20 MG PO CPDR: 20.0000 mg | DELAYED_RELEASE_CAPSULE | Freq: Every day | ORAL | Status: DC | PRN

## 2015-07-21 MED ORDER — VALACYCLOVIR HCL 500 MG PO TABS: 500.0000 mg | ORAL_TABLET | ORAL | Status: DC | PRN

## 2015-07-21 NOTE — Assessment & Plan Note (Signed)
Check tsh  Titrate med dose if needed  

## 2015-07-21 NOTE — Progress Notes (Signed)
Subjective:    Patient ID: Rachel Knight, female    DOB: October 27, 1950, 65 y.o.   MRN: [id number]  HPI She is here to establish with a new pcp.  She is here for a physical exam.   She denies any changes in her history. She has no concerns.  Medications and allergies reviewed with patient and updated if appropriate.  Patient Active Problem List   Diagnosis Date Noted  . Right hip pain 12/10/2012  . HYPOTHYROIDISM 11/30/2009  . VITAMIN D DEFICIENCY 11/30/2009  . ALLERGIC RHINITIS 11/30/2009  . OSTEOPENIA 11/30/2009    Current Outpatient Prescriptions on File Prior to Visit  Medication Sig Dispense Refill  . acetaminophen (TYLENOL) 500 MG tablet Take 1,000 mg by mouth every 6 (six) hours as needed for mild pain.    . Ascorbic Acid (VITAMIN C) 1000 MG tablet Take 1,000 mg by mouth daily.    . Calcium Carbonate-Vitamin D (CALCIUM-VITAMIN D) 500-200 MG-UNIT per tablet Take 1 tablet by mouth daily.     . citalopram (CELEXA) 20 MG tablet TAKE 1 TABLET BY MOUTH DAILY 30 tablet 11  . ipratropium (ATROVENT) 0.06 % nasal spray Place 2 sprays into the nose 2 (two) times daily as needed. For allergy related sinus relief 15 mL 2  . levothyroxine (LEVOXYL) 75 MCG tablet Take 1 tablet (75 mcg total) by mouth daily. 30 tablet 11  . Omega-3 Fatty Acids (FISH OIL) 1000 MG CAPS Take by mouth daily.    . valACYclovir (VALTREX) 500 MG tablet Take 1 tablet (500 mg total) by mouth daily. (Patient taking differently: Take 500 mg by mouth as needed. ) 90 tablet 3   No current facility-administered medications on file prior to visit.    Past Medical History  Diagnosis Date  . Perennial allergic rhinitis   . Osteopenia   . Hypothyroidism   . Anxiety   . Depression   . 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  Marland Kitchen HSV-1 infection     Past Surgical History  Procedure Laterality Date  . Breast enhancement surgery  1996  . Colonoscopy  2000 , 2007    Dr Olevia Perches. Due 2017  .  Appendectomy  2011  . Augmentation mammaplasty      Social History   Social History  . Marital Status: Married    Spouse Name: N/A  . Number of Children: N/A  . Years of Education: N/A   Social History Main Topics  . Smoking status: Former Research scientist (life sciences)  . Smokeless tobacco: Never Used     Comment: only smoked age [age] , 1 ppweek  . Alcohol Use: 4.2 - 8.4 oz/week    7-14 Shots of liquor per week     Comment: 1-2 drinks/day  . Drug Use: No  . Sexual Activity:    Partners: Male    Birth Control/ Protection: Post-menopausal   Other Topics Concern  . None   Social History Narrative    Family History  Problem Relation Age of Onset  . Stroke Father 55  . Colon polyps Father   . Other Brother 45    OS muscle paralysis , resolution over 3 days. Neg evaluation  . Colon cancer Paternal Grandfather   . Coronary artery disease Maternal Grandmother 63  . Colon cancer Paternal Grandmother   . Coronary artery disease Maternal Grandfather 60  . Diabetes Neg Hx   . Breast cancer Mother 28  . Thyroid disease Mother  Review of Systems  Constitutional: Negative for fever, chills, appetite change, fatigue and unexpected weight change.  HENT: Negative for hearing loss.   Eyes: Negative for visual disturbance.  Respiratory: Negative for cough, shortness of breath and wheezing.   Cardiovascular: Negative for chest pain, palpitations and leg swelling.  Gastrointestinal: Negative for nausea, abdominal pain, diarrhea, constipation and blood in stool.       Occ gerd  Genitourinary: Negative for dysuria and hematuria.  Musculoskeletal: Positive for arthralgias (hip pain).  Neurological: Positive for light-headedness (postural). Negative for headaches.       Objective:   Filed Vitals:   07/21/15 1033  BP: 96/64  Pulse: 58  Temp: 97.6 F (36.4 C)  Resp: 16   Filed Weights   07/21/15 1033  Weight: 128 lb (58.06 kg)   Body mass index is 20.67 kg/(m^2).   Physical  Exam Constitutional: She appears well-developed and well-nourished. No distress.  HENT:  Head: Normocephalic and atraumatic.  Right Ear: External ear normal. Normal ear canal and TM Left Ear: External ear normal.  Normal ear canal and TM Mouth/Throat: Oropharynx is clear and moist.  Eyes: Conjunctivae and EOM are normal.  Neck: Neck supple. No tracheal deviation present. No thyromegaly present.  No carotid bruit  Cardiovascular: Normal rate, regular rhythm and normal heart sounds.   No murmur heard.  No edema. Pulmonary/Chest: Effort normal and breath sounds normal. No respiratory distress. She has no wheezes. She has no rales.  Breast: deferred to Gyn Abdominal: Soft. She exhibits no distension. There is no tenderness.  Lymphadenopathy: She has no cervical adenopathy.  Skin: Skin is warm and dry. She is not diaphoretic.  Psychiatric: She has a normal mood and affect. Her behavior is normal.       Assessment & Plan:   Physical exam: Screening blood work - some has been done so will order some blood work Immunizations - Up to date  Colonoscopy - due -- will refer today Mammogram  Up to date  Gyn Up to date  Dexa  Up to date  Eye exams  Up to date  Exercise - regular Weight - normal BMI  Skin  - no conerns Substance abuse  - no evidence of abuse   See Problem List for Assessment and Plan of chronic medical problems.

## 2015-07-21 NOTE — Patient Instructions (Addendum)
Test(s) ordered today. Your results will be released to MyChart (or called to you) after review, usually within 72hours after test completion. If any changes need to be made, you will be notified at that same time.  All other Health Maintenance issues reviewed.   All recommended immunizations and age-appropriate screenings are up-to-date or discussed.  No immunizations administered today.   Medications reviewed and updated.  No changes recommended at this time.  Your prescription(s) have been submitted to your pharmacy. Please take as directed and contact our office if you believe you are having problem(s) with the medication(s).   Please followup in one year   Health Maintenance, Female Adopting a healthy lifestyle and getting preventive care can go a long way to promote health and wellness. Talk with your health care provider about what schedule of regular examinations is right for you. This is a good chance for you to check in with your provider about disease prevention and staying healthy. In between checkups, there are plenty of things you can do on your own. Experts have done a lot of research about which lifestyle changes and preventive measures are most likely to keep you healthy. Ask your health care provider for more information. WEIGHT AND DIET  Eat a healthy diet  Be sure to include plenty of vegetables, fruits, low-fat dairy products, and lean protein.  Do not eat a lot of foods high in solid fats, added sugars, or salt.  Get regular exercise. This is one of the most important things you can do for your health.  Most adults should exercise for at least 150 minutes each week. The exercise should increase your heart rate and make you sweat (moderate-intensity exercise).  Most adults should also do strengthening exercises at least twice a week. This is in addition to the moderate-intensity exercise.  Maintain a healthy weight  Body mass index (BMI) is a measurement that  can be used to identify possible weight problems. It estimates body fat based on height and weight. Your health care provider can help determine your BMI and help you achieve or maintain a healthy weight.  For females 20 years of age and older:   A BMI below 18.5 is considered underweight.  A BMI of 18.5 to 24.9 is normal.  A BMI of 25 to 29.9 is considered overweight.  A BMI of 30 and above is considered obese.  Watch levels of cholesterol and blood lipids  You should start having your blood tested for lipids and cholesterol at 63 years of age, then have this test every 5 years.  You may need to have your cholesterol levels checked more often if:  Your lipid or cholesterol levels are high.  You are older than 63 years of age.  You are at high risk for heart disease.  CANCER SCREENING   Lung Cancer  Lung cancer screening is recommended for adults 55-80 years old who are at high risk for lung cancer because of a history of smoking.  A yearly low-dose CT scan of the lungs is recommended for people who:  Currently smoke.  Have quit within the past 15 years.  Have at least a 30-pack-year history of smoking. A pack year is smoking an average of one pack of cigarettes a day for 1 year.  Yearly screening should continue until it has been 15 years since you quit.  Yearly screening should stop if you develop a health problem that would prevent you from having lung cancer treatment.  Breast Cancer    Practice breast self-awareness. This means understanding how your breasts normally appear and feel.  It also means doing regular breast self-exams. Let your health care provider know about any changes, no matter how small.  If you are in your 20s or 30s, you should have a clinical breast exam (CBE) by a health care provider every 1-3 years as part of a regular health exam.  If you are 40 or older, have a CBE every year. Also consider having a breast X-ray (mammogram) every  year.  If you have a family history of breast cancer, talk to your health care provider about genetic screening.  If you are at high risk for breast cancer, talk to your health care provider about having an MRI and a mammogram every year.  Breast cancer gene (BRCA) assessment is recommended for women who have family members with BRCA-related cancers. BRCA-related cancers include:  Breast.  Ovarian.  Tubal.  Peritoneal cancers.  Results of the assessment will determine the need for genetic counseling and BRCA1 and BRCA2 testing. Cervical Cancer Your health care provider may recommend that you be screened regularly for cancer of the pelvic organs (ovaries, uterus, and vagina). This screening involves a pelvic examination, including checking for microscopic changes to the surface of your cervix (Pap test). You may be encouraged to have this screening done every 3 years, beginning at age 21.  For women ages 30-65, health care providers may recommend pelvic exams and Pap testing every 3 years, or they may recommend the Pap and pelvic exam, combined with testing for human papilloma virus (HPV), every 5 years. Some types of HPV increase your risk of cervical cancer. Testing for HPV may also be done on women of any age with unclear Pap test results.  Other health care providers may not recommend any screening for nonpregnant women who are considered low risk for pelvic cancer and who do not have symptoms. Ask your health care provider if a screening pelvic exam is right for you.  If you have had past treatment for cervical cancer or a condition that could lead to cancer, you need Pap tests and screening for cancer for at least 20 years after your treatment. If Pap tests have been discontinued, your risk factors (such as having a new sexual partner) need to be reassessed to determine if screening should resume. Some women have medical problems that increase the chance of getting cervical cancer. In  these cases, your health care provider may recommend more frequent screening and Pap tests. Colorectal Cancer  This type of cancer can be detected and often prevented.  Routine colorectal cancer screening usually begins at 63 years of age and continues through 63 years of age.  Your health care provider may recommend screening at an earlier age if you have risk factors for colon cancer.  Your health care provider may also recommend using home test kits to check for hidden blood in the stool.  A small camera at the end of a tube can be used to examine your colon directly (sigmoidoscopy or colonoscopy). This is done to check for the earliest forms of colorectal cancer.  Routine screening usually begins at age 50.  Direct examination of the colon should be repeated every 5-10 years through 63 years of age. However, you may need to be screened more often if early forms of precancerous polyps or small growths are found. Skin Cancer  Check your skin from head to toe regularly.  Tell your health care provider about any   new moles or changes in moles, especially if there is a change in a mole's shape or color.  Also tell your health care provider if you have a mole that is larger than the size of a pencil eraser.  Always use sunscreen. Apply sunscreen liberally and repeatedly throughout the day.  Protect yourself by wearing long sleeves, pants, a wide-brimmed hat, and sunglasses whenever you are outside. HEART DISEASE, DIABETES, AND HIGH BLOOD PRESSURE   High blood pressure causes heart disease and increases the risk of stroke. High blood pressure is more likely to develop in:  People who have blood pressure in the high end of the normal range (130-139/85-89 mm Hg).  People who are overweight or obese.  People who are African American.  If you are 18-39 years of age, have your blood pressure checked every 3-5 years. If you are 40 years of age or older, have your blood pressure checked  every year. You should have your blood pressure measured twice--once when you are at a hospital or clinic, and once when you are not at a hospital or clinic. Record the average of the two measurements. To check your blood pressure when you are not at a hospital or clinic, you can use:  An automated blood pressure machine at a pharmacy.  A home blood pressure monitor.  If you are between 55 years and 79 years old, ask your health care provider if you should take aspirin to prevent strokes.  Have regular diabetes screenings. This involves taking a blood sample to check your fasting blood sugar level.  If you are at a normal weight and have a low risk for diabetes, have this test once every three years after 63 years of age.  If you are overweight and have a high risk for diabetes, consider being tested at a younger age or more often. PREVENTING INFECTION  Hepatitis B  If you have a higher risk for hepatitis B, you should be screened for this virus. You are considered at high risk for hepatitis B if:  You were born in a country where hepatitis B is common. Ask your health care provider which countries are considered high risk.  Your parents were born in a high-risk country, and you have not been immunized against hepatitis B (hepatitis B vaccine).  You have HIV or AIDS.  You use needles to inject street drugs.  You live with someone who has hepatitis B.  You have had sex with someone who has hepatitis B.  You get hemodialysis treatment.  You take certain medicines for conditions, including cancer, organ transplantation, and autoimmune conditions. Hepatitis C  Blood testing is recommended for:  Everyone born from 1945 through 1965.  Anyone with known risk factors for hepatitis C. Sexually transmitted infections (STIs)  You should be screened for sexually transmitted infections (STIs) including gonorrhea and chlamydia if:  You are sexually active and are younger than 63 years  of age.  You are older than 63 years of age and your health care provider tells you that you are at risk for this type of infection.  Your sexual activity has changed since you were last screened and you are at an increased risk for chlamydia or gonorrhea. Ask your health care provider if you are at risk.  If you do not have HIV, but are at risk, it may be recommended that you take a prescription medicine daily to prevent HIV infection. This is called pre-exposure prophylaxis (PrEP). You are considered at risk   if:  You are sexually active and do not regularly use condoms or know the HIV status of your partner(s).  You take drugs by injection.  You are sexually active with a partner who has HIV. Talk with your health care provider about whether you are at high risk of being infected with HIV. If you choose to begin PrEP, you should first be tested for HIV. You should then be tested every 3 months for as long as you are taking PrEP.  PREGNANCY   If you are premenopausal and you may become pregnant, ask your health care provider about preconception counseling.  If you may become pregnant, take 400 to 800 micrograms (mcg) of folic acid every day.  If you want to prevent pregnancy, talk to your health care provider about birth control (contraception). OSTEOPOROSIS AND MENOPAUSE   Osteoporosis is a disease in which the bones lose minerals and strength with aging. This can result in serious bone fractures. Your risk for osteoporosis can be identified using a bone density scan.  If you are 65 years of age or older, or if you are at risk for osteoporosis and fractures, ask your health care provider if you should be screened.  Ask your health care provider whether you should take a calcium or vitamin D supplement to lower your risk for osteoporosis.  Menopause may have certain physical symptoms and risks.  Hormone replacement therapy may reduce some of these symptoms and risks. Talk to your  health care provider about whether hormone replacement therapy is right for you.  HOME CARE INSTRUCTIONS   Schedule regular health, dental, and eye exams.  Stay current with your immunizations.   Do not use any tobacco products including cigarettes, chewing tobacco, or electronic cigarettes.  If you are pregnant, do not drink alcohol.  If you are breastfeeding, limit how much and how often you drink alcohol.  Limit alcohol intake to no more than 1 drink per day for nonpregnant women. One drink equals 12 ounces of beer, 5 ounces of wine, or 1 ounces of hard liquor.  Do not use street drugs.  Do not share needles.  Ask your health care provider for help if you need support or information about quitting drugs.  Tell your health care provider if you often feel depressed.  Tell your health care provider if you have ever been abused or do not feel safe at home.   This information is not intended to replace advice given to you by your health care provider. Make sure you discuss any questions you have with your health care provider.   Document Released: 09/05/2010 Document Revised: 03/13/2014 Document Reviewed: 01/22/2013 Elsevier Interactive Patient Education 2016 [employer]  

## 2015-07-21 NOTE — Progress Notes (Signed)
Pre visit review using our clinic review tool, if applicable. No additional management support is needed unless otherwise documented below in the visit note. 

## 2015-07-21 NOTE — Assessment & Plan Note (Signed)
Check vitamin d 

## 2015-07-21 NOTE — Assessment & Plan Note (Signed)
Takes valtrex as needed.

## 2015-07-21 NOTE — Assessment & Plan Note (Addendum)
?   Taking 1000 units of vitamin d daily 1000 mg of calcium daily Will check vitamin d level dexa up to date Exercising regularly -- keep up with walking

## 2015-07-21 NOTE — Assessment & Plan Note (Signed)
maintaining with exercise/pilates

## 2015-07-22 LAB — HEPATITIS C ANTIBODY: HCV Ab: NEGATIVE

## 2015-07-22 LAB — HIV ANTIBODY (ROUTINE TESTING W REFLEX): HIV 1&2 Ab, 4th Generation: NONREACTIVE

## 2015-07-23 LAB — VITAMIN D 1,25 DIHYDROXY
VITAMIN D3 1, 25 (OH): 35 pg/mL
Vitamin D 1, 25 (OH)2 Total: 35 pg/mL (ref 18–72)

## 2015-07-24 ENCOUNTER — Encounter: Payer: Self-pay | Admitting: Internal Medicine

## 2015-10-18 ENCOUNTER — Encounter: Payer: Self-pay | Admitting: Gastroenterology

## 2015-11-05 HISTORY — PX: NECK LIFT: SHX6573

## 2015-12-29 ENCOUNTER — Encounter: Payer: Self-pay | Admitting: Gastroenterology

## 2015-12-29 ENCOUNTER — Ambulatory Visit: Payer: BLUE CROSS/BLUE SHIELD | Admitting: *Deleted

## 2015-12-29 VITALS — Ht 66.0 in | Wt 129.6 lb

## 2015-12-29 DIAGNOSIS — Z800 Family history of malignant neoplasm of digestive organs: Secondary | ICD-10-CM

## 2015-12-29 MED ORDER — SUPREP BOWEL PREP KIT 17.5-3.13-1.6 GM/177ML PO SOLN: 1.0000 | Freq: Once | ORAL | 0 refills | Status: AC

## 2015-12-29 NOTE — Progress Notes (Signed)
Patient denies any allergies to egg or soy products. Patient denies complications with anesthesia/sedation.  Patient denies oxygen use at home and denies diet medications. Emmi instructions for colonoscopy explained but patient denied.     

## 2016-01-03 ENCOUNTER — Telehealth: Payer: Self-pay | Admitting: Gastroenterology

## 2016-01-03 NOTE — Telephone Encounter (Signed)
She could have one of the $50.00 coupons I think. Please call the patient.

## 2016-01-04 NOTE — Telephone Encounter (Signed)
LMOM that Suprep is Dr. Woodward Ku prep of choice and she feels that this prep gives the best preparation for the colonoscopy. I told her I would call in a pay no more that $50.00 coupon into her [employer]  Coupon called into pharm[facility]- rxbin [id number], rxpcn [id number], rxgrp [id number], card ID [id number]

## 2016-01-11 ENCOUNTER — Ambulatory Visit (AMBULATORY_SURGERY_CENTER): Payer: BLUE CROSS/BLUE SHIELD | Admitting: Gastroenterology

## 2016-01-11 ENCOUNTER — Encounter: Payer: Self-pay | Admitting: Gastroenterology

## 2016-01-11 VITALS — BP 107/53 | HR 56 | Temp 96.5°F | Resp 9 | Ht 66.0 in | Wt 129.0 lb

## 2016-01-11 DIAGNOSIS — D120 Benign neoplasm of cecum: Secondary | ICD-10-CM

## 2016-01-11 DIAGNOSIS — Z1211 Encounter for screening for malignant neoplasm of colon: Secondary | ICD-10-CM

## 2016-01-11 DIAGNOSIS — Z1212 Encounter for screening for malignant neoplasm of rectum: Secondary | ICD-10-CM | POA: Diagnosis not present

## 2016-01-11 DIAGNOSIS — Z800 Family history of malignant neoplasm of digestive organs: Secondary | ICD-10-CM

## 2016-01-11 MED ORDER — SODIUM CHLORIDE 0.9 % IV SOLN: 500.0000 mL | INTRAVENOUS | Status: DC

## 2016-01-11 NOTE — Progress Notes (Signed)
Called to room to assist during endoscopic procedure.  Patient ID and intended procedure confirmed with present staff. Received instructions for my participation in the procedure from the performing physician.  

## 2016-01-11 NOTE — Patient Instructions (Signed)
YOU HAD AN ENDOSCOPIC PROCEDURE TODAY AT [facility]:   Refer to the procedure report that was given to you for any specific questions about what was found during the examination.  If the procedure report does not answer your questions, please call your gastroenterologist to clarify.  If you requested that your care partner not be given the details of your procedure findings, then the procedure report has been included in a sealed envelope for you to review at your convenience later.  YOU SHOULD EXPECT: Some feelings of bloating in the abdomen. Passage of more gas than usual.  Walking can help get rid of the air that was put into your GI tract during the procedure and reduce the bloating. If you had a lower endoscopy (such as a colonoscopy or flexible sigmoidoscopy) you may notice spotting of blood in your stool or on the toilet paper. If you underwent a bowel prep for your procedure, you may not have a normal bowel movement for a few days.  Please Note:  You might notice some irritation and congestion in your nose or some drainage.  This is from the oxygen used during your procedure.  There is no need for concern and it should clear up in a day or so.  SYMPTOMS TO REPORT IMMEDIATELY:   Following lower endoscopy (colonoscopy or flexible sigmoidoscopy):  Excessive amounts of blood in the stool  Significant tenderness or worsening of abdominal pains  Swelling of the abdomen that is new, acute  Fever of 100F or higher   For urgent or emergent issues, a gastroenterologist can be reached at any hour by calling [phone].   DIET:  We do recommend a small meal at first, but then you may proceed to your regular diet.  Drink plenty of fluids but you should avoid alcoholic beverages for 24 hours.  ACTIVITY:  You should plan to take it easy for the rest of today and you should NOT DRIVE or use heavy machinery until tomorrow (because of the sedation medicines used during the test).     FOLLOW UP: Our staff will call the number listed on your records the next business day following your procedure to check on you and address any questions or concerns that you may have regarding the information given to you following your procedure. If we do not reach you, we will leave a message.  However, if you are feeling well and you are not experiencing any problems, there is no need to return our call.  We will assume that you have returned to your regular daily activities without incident.  If any biopsies were taken you will be contacted by phone or by letter within the next 1-3 weeks.  Please call us at [phone] if you have not heard about the biopsies in 3 weeks.    SIGNATURES/CONFIDENTIALITY: You and/or your care partner have signed paperwork which will be entered into your electronic medical record.  These signatures attest to the fact that that the information above on your After Visit Summary has been reviewed and is understood.  Full responsibility of the confidentiality of this discharge information lies with you and/or your care-partner.  Polyp, hemorrhoids-handouts given  Repeat colonoscopy in 5 years 2022.

## 2016-01-11 NOTE — Progress Notes (Signed)
Report given to PACU RN, vss 

## 2016-01-11 NOTE — Op Note (Signed)
[place] Patient Name: Rachel Knight Procedure Date: 01/11/2016 8:54 AM MRN: [id number] Endoscopist: Mauri Pole , MD Age: 65 Referring MD:  Date of Birth: 05/07/1950 Gender: Female Account #: [id number] Procedure:                Colonoscopy Indications:              Screening for colon cancer: Family history of                            colorectal cancer in distant relative(s) 38 or older Medicines:                Monitored Anesthesia Care Procedure:                Pre-Anesthesia Assessment:                           - Prior to the procedure, a History and Physical                            was performed, and patient medications and                            allergies were reviewed. The patient's tolerance of                            previous anesthesia was also reviewed. The risks                            and benefits of the procedure and the sedation                            options and risks were discussed with the patient.                            All questions were answered, and informed consent                            was obtained. Prior Anticoagulants: The patient has                            taken no previous anticoagulant or antiplatelet                            agents. ASA Grade Assessment: II - A patient with                            mild systemic disease. After reviewing the risks                            and benefits, the patient was deemed in                            satisfactory condition to undergo the procedure.  After obtaining informed consent, the colonoscope                            was passed under direct vision. Throughout the                            procedure, the patient's blood pressure, pulse, and                            oxygen saturations were monitored continuously. The                            Model CF-HQ190L [id number]) scope was introduced                            through  the anus and advanced to the the terminal                            ileum, with identification of the appendiceal                            orifice and IC valve. The colonoscopy was performed                            without difficulty. The patient tolerated the                            procedure well. The quality of the bowel                            preparation was excellent. The terminal ileum,                            ileocecal valve, appendiceal orifice, and rectum                            were photographed. Scope In: 9:11:28 AM Scope Out: 9:30:59 AM Scope Withdrawal Time: 0 hours 11 minutes 56 seconds  Total Procedure Duration: 0 hours 19 minutes 31 seconds  Findings:                 The perianal and digital rectal examinations were                            normal.                           A 4 mm polyp was found in the cecum. The polyp was                            sessile. The polyp was removed with a piecemeal                            technique using a cold snare. Resection and  retrieval were complete. Coagulation for hemostasis                            of bleeding caused by the procedure using monopolar                            probe was successful.                           Non-bleeding internal hemorrhoids were found during                            retroflexion. The hemorrhoids were small.                           The exam was otherwise without abnormality. Complications:            No immediate complications. Estimated Blood Loss:     Estimated blood loss was minimal. Impression:               - One 4 mm polyp in the cecum, removed piecemeal                            using a cold snare. Resected and retrieved. Treated                            with a monopolar probe.                           - Non-bleeding internal hemorrhoids.                           - The examination was otherwise normal. Recommendation:           -  Patient has a contact number available for                            emergencies. The signs and symptoms of potential                            delayed complications were discussed with the                            patient. Return to normal activities tomorrow.                            Written discharge instructions were provided to the                            patient.                           - Resume previous diet.                           - Continue present medications.                           -  Await pathology results.                           - Repeat colonoscopy in 5 years for surveillance. Mauri Pole, MD 01/11/2016 9:40:06 AM This report has been signed electronically.

## 2016-01-12 ENCOUNTER — Telehealth: Payer: Self-pay | Admitting: *Deleted

## 2016-01-12 NOTE — Telephone Encounter (Signed)
  Follow up Call-  Call back number 01/11/2016  Post procedure Call Back phone  # [phone]  Permission to leave phone message Yes  Some recent data might be hidden     Patient questions:  Do you have a fever, pain , or abdominal swelling? No. Pain Score  0 *  Have you tolerated food without any problems? Yes.    Have you been able to return to your normal activities? Yes.    Do you have any questions about your discharge instructions: Diet   No. Medications  No. Follow up visit  No.  Do you have questions or concerns about your Care? No.  Actions: * If pain score is 4 or above: No action needed, pain <4.

## 2016-01-19 ENCOUNTER — Encounter: Payer: Self-pay | Admitting: Gastroenterology

## 2016-01-28 DIAGNOSIS — F419 Anxiety disorder, unspecified: Secondary | ICD-10-CM

## 2016-01-28 DIAGNOSIS — F32A Depression, unspecified: Secondary | ICD-10-CM | POA: Insufficient documentation

## 2016-01-28 DIAGNOSIS — F329 Major depressive disorder, single episode, unspecified: Secondary | ICD-10-CM | POA: Insufficient documentation

## 2016-02-08 ENCOUNTER — Telehealth: Payer: Self-pay | Admitting: Allergy and Immunology

## 2016-02-08 NOTE — Telephone Encounter (Signed)
Patient was last seen by Dr. Neldon Mc on 07-07-14. She has an appointment on 03-28-16. She said she will not have enough of her ipratropium .06% nasal spray, to get her to her appointment and she was wondering if a nurse could call in enough to get her to her appointment.

## 2016-02-08 NOTE — Telephone Encounter (Signed)
L/m for patient that we cannot refill medication since its been so long. She can contact PCP to see if they will fill or see one of the other providers sooner

## 2016-02-08 NOTE — Telephone Encounter (Signed)
Patient advised she will ask PCP to write rx

## 2016-02-17 ENCOUNTER — Encounter: Payer: Self-pay | Admitting: Internal Medicine

## 2016-02-17 ENCOUNTER — Ambulatory Visit (INDEPENDENT_AMBULATORY_CARE_PROVIDER_SITE_OTHER): Payer: BLUE CROSS/BLUE SHIELD | Admitting: Internal Medicine

## 2016-02-17 VITALS — BP 108/62 | HR 64 | Temp 97.7°F | Resp 16 | Ht 66.0 in | Wt 128.0 lb

## 2016-02-17 DIAGNOSIS — R748 Abnormal levels of other serum enzymes: Secondary | ICD-10-CM | POA: Diagnosis not present

## 2016-02-17 DIAGNOSIS — K529 Noninfective gastroenteritis and colitis, unspecified: Secondary | ICD-10-CM | POA: Diagnosis not present

## 2016-02-17 DIAGNOSIS — K648 Other hemorrhoids: Secondary | ICD-10-CM

## 2016-02-17 DIAGNOSIS — R778 Other specified abnormalities of plasma proteins: Secondary | ICD-10-CM | POA: Insufficient documentation

## 2016-02-17 DIAGNOSIS — R7989 Other specified abnormal findings of blood chemistry: Secondary | ICD-10-CM

## 2016-02-17 MED ORDER — ONDANSETRON HCL 4 MG PO TABS: 4.0000 mg | ORAL_TABLET | Freq: Three times a day (TID) | ORAL | 0 refills | Status: DC | PRN

## 2016-02-17 MED ORDER — HYDROCORTISONE ACETATE 25 MG RE SUPP: 25.0000 mg | Freq: Two times a day (BID) | RECTAL | 5 refills | Status: DC

## 2016-02-17 MED ORDER — IPRATROPIUM BROMIDE 0.06 % NA SOLN: 2.0000 | Freq: Every day | NASAL | 5 refills | Status: DC | PRN

## 2016-02-17 NOTE — Progress Notes (Signed)
Subjective:    Patient ID: Rachel Knight, female    DOB: 1950/11/12, 65 y.o.   MRN: [id number]  HPI She is here for follow up from the hospital.   She went to an urgent care 11/24 for a stomach bug.  She has nausea, vomiting, abdominal pain and diarrhea.  She was dehydrated and felt she needed IV fluids.  in urgent care they did blood work and happened to do a troponin, which was elevated.  She was transferred to the local ED.  She had blood work, Ct scan of her abdomen, EKGs  and stress test. She saw cardiology.  She did not have a heart attack and her stress test was normal.  The ct scan showed a spot on her liver that she was told was benign.    She was diangosed with gastroenteritis.  She feels great.  She denies any residual GI symptoms.  She denies chest pain, palpitations, SOB, edema and lightheadedness.  She will be traveling to [place] next month and wondered if she could have a prescription for an anti-nausea medication - just to have just in case.  She also has hemorrhoids and wondered about something stronger than what is OTC.  Medications and allergies reviewed with patient and updated if appropriate.  Patient Active Problem List   Diagnosis Date Noted  . 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 Right hip pain 12/10/2012  . Hypothyroidism 11/30/2009  . Vitamin D deficiency 11/30/2009  . ALLERGIC RHINITIS 11/30/2009  . Osteopenia 11/30/2009    Current Outpatient Prescriptions on File Prior to Visit  Medication Sig Dispense Refill  . acetaminophen (TYLENOL) 500 MG tablet Take 1,000 mg by mouth every 6 (six) hours as needed for mild pain.    . Ascorbic Acid (VITAMIN C) 1000 MG tablet Take 1,000 mg by mouth daily.    . Calcium Carbonate-Vitamin D (CALCIUM-VITAMIN D) 500-200 MG-UNIT per tablet Take 1 tablet by mouth daily.     . citalopram (CELEXA) 20 MG tablet TAKE 1 TABLET BY MOUTH DAILY (Patient taking differently: at bedtime. TAKE 1 TABLET BY MOUTH DAILY) 30 tablet 11  .  ipratropium (ATROVENT) 0.06 % nasal spray Place 2 sprays into both nostrils daily as needed.     Marland Kitchen levothyroxine (LEVOXYL) 75 MCG tablet Take 1 tablet (75 mcg total) by mouth daily. 30 tablet 11  . Omega-3 Fatty Acids (FISH OIL) 1000 MG CAPS Take by mouth daily.    Marland Kitchen omeprazole (PRILOSEC) 20 MG capsule Take 1 capsule (20 mg total) by mouth daily as needed. 30 capsule 11  . valACYclovir (VALTREX) 500 MG tablet Take 1 tablet (500 mg total) by mouth as needed. 30 tablet 2   No current facility-administered medications on file prior to visit.     Past Medical History:  Diagnosis Date  . Anxiety   . Depression   . GERD (gastroesophageal reflux disease)    occasional   . HSV-1 infection   . Hypothyroidism   . Osteopenia   . Perennial allergic rhinitis   . 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    Past Surgical History:  Procedure Laterality Date  . APPENDECTOMY  2011  . AUGMENTATION MAMMAPLASTY    . BREAST ENHANCEMENT SURGERY  1996  . COLONOSCOPY  2000 , 2007   Dr Olevia Perches. Due 2017  . NECK LIFT  11/2015  . WISDOM TOOTH EXTRACTION      Social History   Social History  . Marital status:  Married    Spouse name: N/A  . Number of children: N/A  . Years of education: N/A   Social History Main Topics  . Smoking status: Former Smoker    Types: Cigarettes  . Smokeless tobacco: Never Used     Comment: only smoked age [age] , 1 ppweek  . Alcohol use 4.2 oz/week    7 Shots of liquor per week     Comment: 1 drinks/day  . Drug use: No  . Sexual activity: Yes    Partners: Male    Birth control/ protection: Post-menopausal   Other Topics Concern  . Not on file   Social History Narrative   Exercise: weights, walking, pilates, swimming    Family History  Problem Relation Age of Onset  . Stroke Father 26  . Colon polyps Father   . Other Brother 45    OS muscle paralysis , resolution over 3 days. Neg evaluation  . Breast cancer Mother 56  . Thyroid disease  Mother   . Colon cancer Paternal Grandfather   . Coronary artery disease Maternal Grandmother 63  . Colon cancer Paternal Grandmother   . Coronary artery disease Maternal Grandfather 60  . Diabetes Neg Hx   . Esophageal cancer Neg Hx   . Rectal cancer Neg Hx   . Stomach cancer Neg Hx     Review of Systems  Constitutional: Negative for fatigue and fever.  Respiratory: Negative for shortness of breath.   Cardiovascular: Negative for chest pain, palpitations and leg swelling.  Gastrointestinal: Negative for abdominal pain, constipation, diarrhea and nausea.  Neurological: Negative for light-headedness and headaches.       Objective:   Vitals:   02/17/16 1007  BP: 108/62  Pulse: 64  Resp: 16  Temp: 97.7 F (36.5 C)   Filed Weights   02/17/16 1007  Weight: 128 lb (58.1 kg)   Body mass index is 20.66 kg/m.   Physical Exam  Constitutional: She appears well-developed and well-nourished. No distress.  HENT:  Head: Normocephalic and atraumatic.  Eyes: Conjunctivae are normal.  Neck: Neck supple. No tracheal deviation present. No thyromegaly present.  Cardiovascular: Normal rate, regular rhythm and normal heart sounds.   No murmur heard. Pulmonary/Chest: Effort normal and breath sounds normal. No respiratory distress. She has no wheezes. She has no rales.  Abdominal: Soft. She exhibits no distension and no mass. There is no tenderness. There is no rebound and no guarding.  Musculoskeletal: She exhibits no edema.  Lymphadenopathy:    She has no cervical adenopathy.  Skin: Skin is warm and dry. She is not diaphoretic.          Assessment & Plan:   See Problem List for Assessment and Plan of chronic medical problems.

## 2016-02-17 NOTE — Progress Notes (Signed)
Pre visit review using our clinic review tool, if applicable. No additional management support is needed unless otherwise documented below in the visit note. 

## 2016-02-17 NOTE — Assessment & Plan Note (Signed)
Was likely viral in nature Went to urgent care Received IVF, antinausea medication Completely resolved

## 2016-02-17 NOTE — Assessment & Plan Note (Signed)
Elevated 01/2016 in the setting of gastroenteritis Cardiac work up negative including serial EKGs and stress test She will have records transferred No further evaluation needed

## 2016-02-17 NOTE — Assessment & Plan Note (Signed)
anusol 25 mg supp Use as needed

## 2016-02-17 NOTE — Patient Instructions (Signed)
  Medications reviewed and updated.  No changes recommended at this time.  Your prescription(s) have been submitted to your pharmacy. Please take as directed and contact our office if you believe you are having problem(s) with the medication(s).      

## 2016-03-09 ENCOUNTER — Other Ambulatory Visit: Payer: Self-pay | Admitting: Obstetrics and Gynecology

## 2016-03-09 NOTE — Telephone Encounter (Signed)
Medication refill request: Levothyroxine 22mcg Last AEX:  02/12/15 BS Next AEX: 03/16/16 Last MMG (if hormonal medication request): 04/07/15 BIRADS 1 negative Refill authorized: 02/12/15 #30 w/11 refills; today please advise - Last TSH done 07/21/15 = 2.12

## 2016-03-10 ENCOUNTER — Other Ambulatory Visit: Payer: Self-pay | Admitting: Obstetrics and Gynecology

## 2016-03-13 ENCOUNTER — Encounter: Payer: Self-pay | Admitting: Internal Medicine

## 2016-03-13 NOTE — Telephone Encounter (Signed)
Medication refill request: celexa  Last AEX:  02/12/15  Next AEX: 03/16/16 Dr. Quincy Simmonds  Last MMG (if hormonal medication request): 04/07/15 BIRADS1:neg  Refill authorized: 02/12/15 #30/11R. Today #30/0R?

## 2016-03-16 ENCOUNTER — Ambulatory Visit (INDEPENDENT_AMBULATORY_CARE_PROVIDER_SITE_OTHER): Payer: BLUE CROSS/BLUE SHIELD | Admitting: Obstetrics and Gynecology

## 2016-03-16 ENCOUNTER — Encounter: Payer: Self-pay | Admitting: Obstetrics and Gynecology

## 2016-03-16 VITALS — BP 100/66 | HR 80 | Resp 16 | Ht 66.0 in | Wt 127.4 lb

## 2016-03-16 DIAGNOSIS — Z0000 Encounter for general adult medical examination without abnormal findings: Secondary | ICD-10-CM | POA: Diagnosis not present

## 2016-03-16 DIAGNOSIS — Z01419 Encounter for gynecological examination (general) (routine) without abnormal findings: Secondary | ICD-10-CM | POA: Diagnosis not present

## 2016-03-16 LAB — POCT URINALYSIS DIPSTICK
Bilirubin, UA: NEGATIVE
Glucose, UA: NEGATIVE
Ketones, UA: NEGATIVE
Leukocytes, UA: NEGATIVE
Nitrite, UA: NEGATIVE
Protein, UA: NEGATIVE
Urobilinogen, UA: NEGATIVE
pH, UA: 5

## 2016-03-16 MED ORDER — LEVOTHYROXINE SODIUM 75 MCG PO TABS: 75.0000 ug | ORAL_TABLET | Freq: Every day | ORAL | 11 refills | Status: DC

## 2016-03-16 MED ORDER — CITALOPRAM HYDROBROMIDE 20 MG PO TABS
20.0000 mg | ORAL_TABLET | Freq: Every day | ORAL | 11 refills | Status: DC
Start: 2016-03-16 — End: 2017-04-04

## 2016-03-16 NOTE — Progress Notes (Signed)
66 y.o. G72P2002 Married Caucasian female here for annual exam.    Had an intestinal virus last fall. Went to the ER and elevated troponin level so she had a work up for an MI. Had cardiac perfusion study which was normal.  Had a normal cholesterol and hemoglobin A1C.  Loss of interest in sex.  Husband had prostate surgery one year ago and doing well. Taking Citalopram. Is fatigued and caring for others who have illness.  Vaginal dryness.  Uses water based products. Declines estrogen.   No vaginal bleeding.   URI today with congestion and cough.  No fever.  PCP:  Billey Gosling, MD   Patient's last menstrual period was 06/05/2006 (approximate).           Sexually active: Yes.   female The current method of family planning is post menopausal status.    Exercising: Yes.    Walking, weights, pilates, swimming Smoker:  no  Health Maintenance: Pap:  01-10-13 Neg:Neg HR HPv History of abnormal Pap:  no MMG: 04-07-15 Bil. Saline Implants/Density B/Neg/BiRads1:[facility].   Colonoscopy:  01-11-16 polyp with [facility];next due 01/2021. BMD:   04-07-15  Result  Osteopenia hip and spine:[facility] TDaP:  2010 Gardasil:   N/A HIV: neg 07/21/15 Hep C: neg 07/21/15 Screening Labs:  Hb today: PCP, Urine today: Trace RBCs--asymptomatic.   reports that she has quit smoking. Her smoking use included Cigarettes. She has never used smokeless tobacco. She reports that she drinks about 4.2 oz of alcohol per week . She reports that she does not use drugs.  Past Medical History:  Diagnosis Date  . Anxiety   . Depression   . GERD (gastroesophageal reflux disease)    occasional   . HSV-1 infection   . Hypothyroidism   . Osteopenia   . Perennial allergic rhinitis   . 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  Marland Kitchen Vitamin D deficiency     Past Surgical History:  Procedure Laterality Date  . APPENDECTOMY  2011  . AUGMENTATION MAMMAPLASTY    . BREAST ENHANCEMENT SURGERY  1996  . COLONOSCOPY   2000 , 2007   Dr Olevia Perches. Due 2017  . NECK LIFT  11/2015  . WISDOM TOOTH EXTRACTION      Current Outpatient Prescriptions  Medication Sig Dispense Refill  . acetaminophen (TYLENOL) 500 MG tablet Take 1,000 mg by mouth every 6 (six) hours as needed for mild pain.    . Ascorbic Acid (VITAMIN C) 1000 MG tablet Take 1,000 mg by mouth daily.    . Calcium Carbonate-Vitamin D (CALCIUM-VITAMIN D) 500-200 MG-UNIT per tablet Take 1 tablet by mouth daily.     . Cholecalciferol (VITAMIN D3) 2000 units TABS Take 1 tablet by mouth daily.    . citalopram (CELEXA) 20 MG tablet take 1 tablet by mouth once daily 30 tablet 0  . ipratropium (ATROVENT) 0.06 % nasal spray Place 2 sprays into both nostrils daily as needed. 15 mL 5  . levothyroxine (SYNTHROID, LEVOTHROID) 75 MCG tablet take 1 tablet by mouth once daily 30 tablet 0  . Omega-3 Fatty Acids (FISH OIL) 1000 MG CAPS Take by mouth daily.    Marland Kitchen omeprazole (PRILOSEC) 20 MG capsule Take 1 capsule (20 mg total) by mouth daily as needed. 30 capsule 11  . valACYclovir (VALTREX) 500 MG tablet Take 1 tablet (500 mg total) by mouth as needed. 30 tablet 2  . vitamin B-12 (CYANOCOBALAMIN) 500 MCG tablet Take 500 mcg by mouth daily.  No current facility-administered medications for this visit.     Family History  Problem Relation Age of Onset  . Stroke Father 52  . Colon polyps Father   . Other Brother 45    OS muscle paralysis , resolution over 3 days. Neg evaluation  . Breast cancer Mother 23  . Thyroid disease Mother   . Colon cancer Paternal Grandfather   . Coronary artery disease Maternal Grandmother 63  . Colon cancer Paternal Grandmother   . Coronary artery disease Maternal Grandfather 60  . Diabetes Neg Hx   . Esophageal cancer Neg Hx   . Rectal cancer Neg Hx   . Stomach cancer Neg Hx     ROS:  Pertinent items are noted in HPI.  Otherwise, a comprehensive ROS was negative.  Exam:   BP 100/66 (BP Location: Right Arm, Patient Position:  Sitting, Cuff Size: Normal)   Pulse 80   Resp 16   Ht 5\' 6"  (1.676 m)   Wt 127 lb 6.4 oz (57.8 kg)   LMP 06/05/2006 (Approximate)   BMI 20.56 kg/m     General appearance: alert, cooperative and appears stated age Head: Normocephalic, without obvious abnormality, atraumatic Neck: no adenopathy, supple, symmetrical, trachea midline and thyroid normal to inspection and palpation Lungs: clear to auscultation bilaterally Breasts: bilateral implants and scars, normal appearance, no masses or tenderness, No nipple retraction or dimpling, No nipple discharge or bleeding, No axillary or supraclavicular adenopathy Heart: regular rate and rhythm Abdomen: soft, non-tender; no masses, no organomegaly Extremities: extremities normal, atraumatic, no cyanosis or edema Skin: Skin color, texture, turgor normal. No rashes or lesions Lymph nodes: Cervical, supraclavicular, and axillary nodes normal. No abnormal inguinal nodes palpated Neurologic: Grossly normal  Pelvic: External genitalia:  no lesions              Urethra:  normal appearing urethra with no masses, tenderness or lesions              Bartholins and Skenes: normal                 Vagina: normal appearing vagina with normal color and discharge, no lesions              Cervix: no lesions              Pap taken: Yes.   Bimanual Exam:  Uterus:  normal size, contour, position, consistency, mobility, non-tender              Adnexa: no mass, fullness, tenderness              Rectal exam: Yes.  .  Confirms.              Anus:  normal sphincter tone, no lesions  Chaperone was present for exam.  Assessment:   Well woman visit with normal exam. Osteopenia.  Bilateral breast implants.  FH of breast cancer.  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 Valtrex prn. Hypothyroidism.  Depression and anxiety.  On Citalopram.  Is trying to reduce the level.  Atrophy of vagina.  Plan: Mammogram screening discussed. She will schedule. Recommended self breast  awareness. Pap and HR HPV as above. Guidelines for Calcium, Vitamin D, regular exercise program including cardiovascular and weight bearing exercise. Refill of Synthroid and Citalopram for one year each. We discussed the effect of Citalopram on sexual desire. Discussed cooking oils for vaginal atrophy.  She declines local estrogens. BMD next year. Follow up annually and prn.  After visit summary provided.

## 2016-03-16 NOTE — Patient Instructions (Signed)

## 2016-03-20 ENCOUNTER — Encounter: Payer: Self-pay | Admitting: Internal Medicine

## 2016-03-21 LAB — IPS PAP TEST WITH HPV

## 2016-03-22 NOTE — Telephone Encounter (Signed)
Please copy and give to patient.  Thanks.

## 2016-03-28 ENCOUNTER — Ambulatory Visit: Payer: BLUE CROSS/BLUE SHIELD | Admitting: Allergy and Immunology

## 2016-04-04 ENCOUNTER — Encounter: Payer: Self-pay | Admitting: Internal Medicine

## 2016-04-05 ENCOUNTER — Ambulatory Visit (INDEPENDENT_AMBULATORY_CARE_PROVIDER_SITE_OTHER): Payer: BLUE CROSS/BLUE SHIELD | Admitting: Family Medicine

## 2016-04-05 ENCOUNTER — Encounter: Payer: Self-pay | Admitting: Family Medicine

## 2016-04-05 VITALS — BP 122/70 | HR 72 | Temp 98.6°F | Resp 18 | Wt 129.6 lb

## 2016-04-05 DIAGNOSIS — J111 Influenza due to unidentified influenza virus with other respiratory manifestations: Secondary | ICD-10-CM

## 2016-04-05 MED ORDER — HYDROCODONE-HOMATROPINE 5-1.5 MG/5ML PO SYRP: 5.0000 mL | ORAL_SOLUTION | Freq: Three times a day (TID) | ORAL | 0 refills | Status: DC | PRN

## 2016-04-05 MED ORDER — OSELTAMIVIR PHOSPHATE 75 MG PO CAPS: 75.0000 mg | ORAL_CAPSULE | Freq: Two times a day (BID) | ORAL | 0 refills | Status: DC

## 2016-04-05 NOTE — Progress Notes (Addendum)
Subjective:    Patient ID: Rachel Knight, female    DOB: 08-14-1950, 66 y.o.   MRN: [id number]  HPI This is a 66 yo female who presents with 1 day of cough. Her cough is dry, had some clear phlegm and clear nasal drainage yesterday. Took a bendadryl with some relief of drainage. Took Advil 400 mg last night. Slept ok. Has felt worse today as day goes on. No wheeze, no SOB. Feels achy. Subjective fever and chills. No history of asthma.    She has been taking care of her elderly parents, both of whom have recently been diagnosed with influenza A.   Past Medical History:  Diagnosis Date  . Anxiety   . Depression   . GERD (gastroesophageal reflux disease)    occasional   . HSV-1 infection   . Hypothyroidism   . Osteopenia   . Perennial allergic rhinitis   . 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  Marland Kitchen Vitamin D deficiency    Past Surgical History:  Procedure Laterality Date  . APPENDECTOMY  2011  . AUGMENTATION MAMMAPLASTY    . BREAST ENHANCEMENT SURGERY  1996  . COLONOSCOPY  2000 , 2007   Dr Olevia Perches. Due 2017  . NECK LIFT  11/2015  . WISDOM TOOTH EXTRACTION     Family History  Problem Relation Age of Onset  . Stroke Father 52  . Colon polyps Father   . Other Brother 45    OS muscle paralysis , resolution over 3 days. Neg evaluation  . Breast cancer Mother 2  . Thyroid disease Mother   . Colon cancer Paternal Grandfather   . Coronary artery disease Maternal Grandmother 63  . Colon cancer Paternal Grandmother   . Coronary artery disease Maternal Grandfather 60  . Diabetes Neg Hx   . Esophageal cancer Neg Hx   . Rectal cancer Neg Hx   . Stomach cancer Neg Hx    Social History  Substance Use Topics  . Smoking status: Former Smoker    Types: Cigarettes  . Smokeless tobacco: Never Used     Comment: only smoked age [age] , 1 ppweek  . Alcohol use 4.2 oz/week    7 Shots of liquor per week     Comment: 1 drinks/day      Review of Systems Per HPI   Objective:   Physical Exam  Constitutional: She is oriented to person, place, and time. She appears well-developed and well-nourished. She appears ill. No distress.  HENT:  Head: Normocephalic and atraumatic.  Right Ear: Tympanic membrane, external ear and ear canal normal.  Left Ear: Tympanic membrane, external ear and ear canal normal.  Nose: Rhinorrhea present.  Eyes: Conjunctivae are normal.  Neck: Normal range of motion. Neck supple.  Cardiovascular: Normal rate, regular rhythm and normal heart sounds.   Pulmonary/Chest: Effort normal and breath sounds normal.  Neurological: She is alert and oriented to person, place, and time.  Skin: Skin is warm and dry. She is not diaphoretic.  Psychiatric: She has a normal mood and affect. Her behavior is normal. Judgment and thought content normal.  Vitals reviewed.     BP 122/70 (BP Location: Left Arm, Patient Position: Sitting, Cuff Size: Normal)   Pulse 72   Temp 98.6 F (37 C) (Oral)   Resp 18   Wt 129 lb 9.6 oz (58.8 kg)   LMP 06/17/2011   SpO2 100%   BMI 20.92 kg/m      Assessment &  Plan:  1. Influenza - Provided written and verbal information regarding diagnosis and treatment. - RTC/ED precautions reviewed - oseltamivir (TAMIFLU) 75 MG capsule; Take 1 capsule (75 mg total) by mouth 2 (two) times daily.  Dispense: 10 capsule; Refill: 0 - HYDROcodone-homatropine (HYCODAN) 5-1.5 MG/5ML syrup; Take 5 mLs by mouth every 8 (eight) hours as needed for cough.  Dispense: 120 mL; Refill: 0  Clarene Reamer, FNP-BC  [facility] [place], [facility]  04/05/2016 4:34 PM

## 2016-04-05 NOTE — Patient Instructions (Signed)
Thank you for coming in today, I hope you are feeling better soon!  Drink enough fluids to make your urine light yellow. For fever/chill/muscle aches you can take over the counter acetaminophen or ibuprofen (400-600 mg)every 8- 12 hours.  If you are unable to keep liquids down for more than 12 hours or if you become short of breath, go to the emergency room. If you are not better in 7-10 days, please notify the office.    Influenza, Adult Influenza, more commonly known as "the flu," is a viral infection that primarily affects the respiratory tract. The respiratory tract includes organs that help you breathe, such as the lungs, nose, and throat. The flu causes many common cold symptoms, as well as a high fever and body aches. The flu spreads easily from person to person (is contagious). Getting a flu shot (influenza vaccination) every year is the best way to prevent influenza. What are the causes? Influenza is caused by a virus. You can catch the virus by:  Breathing in droplets from an infected person's cough or sneeze.  Touching something that was recently contaminated with the virus and then touching your mouth, nose, or eyes. What increases the risk? The following factors may make you more likely to get the flu:  Not cleaning your hands frequently with soap and water or alcohol-based hand sanitizer.  Having close contact with many people during cold and flu season.  Touching your mouth, eyes, or nose without washing or sanitizing your hands first.  Not drinking enough fluids or not eating a healthy diet.  Not getting enough sleep or exercise.  Being under a high amount of stress.  Not getting a yearly (annual) flu shot. You may be at a higher risk of complications from the flu, such as a severe lung infection (pneumonia), if you:  Are over the age of 52.  Are pregnant.  Have a weakened disease-fighting system (immune system). You may have a weakened immune system if  you:  Have HIV or AIDS.  Are undergoing chemotherapy.  Aretaking medicines that reduce the activity of (suppress) the immune system.  Have a long-term (chronic) illness, such as heart disease, kidney disease, diabetes, or lung disease.  Have a liver disorder.  Are obese.  Have anemia. What are the signs or symptoms? Symptoms of this condition typically last 4-10 days and may include:  Fever.  Chills.  Headache, body aches, or muscle aches.  Sore throat.  Cough.  Runny or congested nose.  Chest discomfort and cough.  Poor appetite.  Weakness or tiredness (fatigue).  Dizziness.  Nausea or vomiting. How is this diagnosed? This condition may be diagnosed based on your medical history and a physical exam. Your health care provider may do a nose or throat swab test to confirm the diagnosis. How is this treated? If influenza is detected early, you can be treated with antiviral medicine that can reduce the length of your illness and the severity of your symptoms. This medicine may be given by mouth (orally) or through an IV tube that is inserted in one of your veins. The goal of treatment is to relieve symptoms by taking care of yourself at home. This may include taking over-the-counter medicines, drinking plenty of fluids, and adding humidity to the air in your home. In some cases, influenza goes away on its own. Severe influenza or complications from influenza may be treated in a hospital. Follow these instructions at home:  Take over-the-counter and prescription medicines only as told  by your health care provider.  Use a cool mist humidifier to add humidity to the air in your home. This can make breathing easier.  Rest as needed.  Drink enough fluid to keep your urine clear or pale yellow.  Cover your mouth and nose when you cough or sneeze.  Wash your hands with soap and water often, especially after you cough or sneeze. If soap and water are not available, use  hand sanitizer.  Stay home from work or school as told by your health care provider. Unless you are visiting your health care provider, try to avoid leaving home until your fever has been gone for 24 hours without the use of medicine.  Keep all follow-up visits as told by your health care provider. This is important. How is this prevented?  Getting an annual flu shot is the best way to avoid getting the flu. You may get the flu shot in late summer, fall, or winter. Ask your health care provider when you should get your flu shot.  Wash your hands often or use hand sanitizer often.  Avoid contact with people who are sick during cold and flu season.  Eat a healthy diet, drink plenty of fluids, get enough sleep, and exercise regularly. Contact a health care provider if:  You develop new symptoms.  You have:  Chest pain.  Diarrhea.  A fever.  Your cough gets worse.  You produce more mucus.  You feel nauseous or you vomit. Get help right away if:  You develop shortness of breath or difficulty breathing.  Your skin or nails turn a bluish color.  You have severe pain or stiffness in your neck.  You develop a sudden headache or sudden pain in your face or ear.  You cannot stop vomiting. This information is not intended to replace advice given to you by your health care provider. Make sure you discuss any questions you have with your health care provider. Document Released: 02/18/2000 Document Revised: 07/29/2015 Document Reviewed: 12/15/2014 Elsevier Interactive Patient Education  2017 [employer].

## 2016-04-05 NOTE — Telephone Encounter (Signed)
Please advise 

## 2016-04-05 NOTE — Progress Notes (Signed)
Pre visit review using our clinic review tool, if applicable. No additional management support is needed unless otherwise documented below in the visit note. 

## 2016-04-11 NOTE — Progress Notes (Signed)
Medical screening examination/treatment/procedure(s) were performed by non-physician practitioner and as supervising physician I was immediately available for consultation/collaboration. I agree with above. Shontae Rosiles, DO   

## 2016-04-18 ENCOUNTER — Other Ambulatory Visit: Payer: Self-pay | Admitting: Allergy and Immunology

## 2016-04-25 ENCOUNTER — Other Ambulatory Visit: Payer: Self-pay | Admitting: *Deleted

## 2016-04-25 MED ORDER — OMEPRAZOLE 20 MG PO CPDR: 20.0000 mg | DELAYED_RELEASE_CAPSULE | Freq: Every day | ORAL | 3 refills | Status: DC | PRN

## 2016-05-03 ENCOUNTER — Ambulatory Visit: Payer: BLUE CROSS/BLUE SHIELD | Admitting: *Deleted

## 2016-05-23 ENCOUNTER — Ambulatory Visit: Payer: BLUE CROSS/BLUE SHIELD | Admitting: Gastroenterology

## 2016-05-29 ENCOUNTER — Ambulatory Visit (INDEPENDENT_AMBULATORY_CARE_PROVIDER_SITE_OTHER): Payer: BLUE CROSS/BLUE SHIELD | Admitting: Gastroenterology

## 2016-05-29 ENCOUNTER — Encounter: Payer: Self-pay | Admitting: Gastroenterology

## 2016-05-29 VITALS — BP 90/68 | HR 76 | Ht 66.0 in | Wt 130.5 lb

## 2016-05-29 DIAGNOSIS — Z86010 Personal history of colonic polyps: Secondary | ICD-10-CM

## 2016-05-29 DIAGNOSIS — K219 Gastro-esophageal reflux disease without esophagitis: Secondary | ICD-10-CM | POA: Diagnosis not present

## 2016-05-29 DIAGNOSIS — R0982 Postnasal drip: Secondary | ICD-10-CM

## 2016-05-29 DIAGNOSIS — R0981 Nasal congestion: Secondary | ICD-10-CM

## 2016-05-29 MED ORDER — RANITIDINE HCL 150 MG PO TABS
150.0000 mg | ORAL_TABLET | Freq: Two times a day (BID) | ORAL | 6 refills | Status: DC
Start: 2016-05-29 — End: 2017-04-04

## 2016-05-29 NOTE — Progress Notes (Signed)
Rachel Knight    [id number]    11-25-1950  Primary Care Physician:Stacy Lorretta Harp, MD  Referring Physician: Binnie Rail, MD [place], [place] [address]  Chief complaint:  GERD  HPI: 77 yr F here for follow up with c/o worsening GERD symptoms in the past 2 months. Her symptoms are worse in the AM when she wakes up, mostly bothered by cough, post nasal drip and congestion. Phlegm worse after she eats a meals. She gets only occasional heartburn, better with PPI daily. She feels GERD symtoms are worse after she had Flu in Jan 2018. Denies any nausea, vomiting, abdominal pain, melena or bright red blood per rectum   Colonoscopy November 2017: 4 mm polyp in cecum (tubular adenoma) removed.    Outpatient Encounter Prescriptions as of 05/29/2016  Medication Sig  . acetaminophen (TYLENOL) 500 MG tablet Take 1,000 mg by mouth every 6 (six) hours as needed for mild pain.  . Ascorbic Acid (VITAMIN C) 1000 MG tablet Take 1,000 mg by mouth daily.  . Calcium Carbonate-Vitamin D (CALCIUM-VITAMIN D) 500-200 MG-UNIT per tablet Take 1 tablet by mouth daily.   . Cholecalciferol (VITAMIN D3) 2000 units TABS Take 1 tablet by mouth daily.  . citalopram (CELEXA) 20 MG tablet Take 1 tablet (20 mg total) by mouth daily.  Marland Kitchen HYDROcodone-homatropine (HYCODAN) 5-1.5 MG/5ML syrup Take 5 mLs by mouth every 8 (eight) hours as needed for cough.  Marland Kitchen ipratropium (ATROVENT) 0.06 % nasal spray Place 2 sprays into both nostrils daily as needed.  Marland Kitchen levothyroxine (SYNTHROID, LEVOTHROID) 75 MCG tablet Take 1 tablet (75 mcg total) by mouth daily.  . Omega-3 Fatty Acids (FISH OIL) 1000 MG CAPS Take by mouth daily.  Marland Kitchen omeprazole (PRILOSEC) 20 MG capsule Take 1 capsule (20 mg total) by mouth daily as needed. Yearly physical due in May must see MD for future refills  . oseltamivir (TAMIFLU) 75 MG capsule Take 1 capsule (75 mg total) by mouth 2 (two) times daily.  . valACYclovir (VALTREX) 500 MG tablet Take  1 tablet (500 mg total) by mouth as needed.  . vitamin B-12 (CYANOCOBALAMIN) 500 MCG tablet Take 500 mcg by mouth daily.   No facility-administered encounter medications on file as of 05/29/2016.     Allergies as of 05/29/2016  . (No Known Allergies)    Past Medical History:  Diagnosis Date  . Anxiety   . Depression   . GERD (gastroesophageal reflux disease)    occasional   . HSV-1 infection   . Hypothyroidism   . Osteopenia   . Perennial allergic rhinitis   . 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  Marland Kitchen Vitamin D deficiency     Past Surgical History:  Procedure Laterality Date  . APPENDECTOMY  2011  . AUGMENTATION MAMMAPLASTY    . BREAST ENHANCEMENT SURGERY  1996  . COLONOSCOPY  2000 , 2007   Dr Olevia Perches. Due 2017  . NECK LIFT  11/2015  . WISDOM TOOTH EXTRACTION      Family History  Problem Relation Age of Onset  . Stroke Father 93  . Colon polyps Father   . Other Brother 45    OS muscle paralysis , resolution over 3 days. Neg evaluation  . Breast cancer Mother 86  . Thyroid disease Mother   . Colon cancer Paternal Grandfather   . Coronary artery disease Maternal Grandmother 63  . Colon cancer Paternal Grandmother   .  Coronary artery disease Maternal Grandfather 60  . Diabetes Neg Hx   . Esophageal cancer Neg Hx   . Rectal cancer Neg Hx   . Stomach cancer Neg Hx     Social History   Social History  . Marital status: Married    Spouse name: N/A  . Number of children: N/A  . Years of education: N/A   Occupational History  . Not on file.   Social History Main Topics  . Smoking status: Former Smoker    Types: Cigarettes  . Smokeless tobacco: Never Used     Comment: only smoked age [age] , 1 ppweek  . Alcohol use 4.2 oz/week    7 Shots of liquor per week     Comment: 1 drinks/day  . Drug use: No  . Sexual activity: Yes    Partners: Male    Birth control/ protection: Post-menopausal   Other Topics Concern  . Not on file   Social  History Narrative   Exercise: weights, walking, pilates, swimming      Review of systems: Review of Systems  Constitutional: Negative for fever and chills.  HENT: Positive for postnasal drip and runny nose Eyes: Negative for blurred vision.  Respiratory: Negative for cough, shortness of breath and wheezing.   Cardiovascular: Negative for chest pain and palpitations.  Gastrointestinal: as per HPI Genitourinary: Negative for dysuria, urgency, frequency and hematuria.  Musculoskeletal: Negative for myalgias, back pain and joint pain.  Skin: Negative for itching and rash.  Neurological: Negative for dizziness, tremors, focal weakness, seizures and loss of consciousness.  Endo/Heme/Allergies: Negative for seasonal allergies.  Psychiatric/Behavioral: Negative for depression, suicidal ideas and hallucinations.  All other systems reviewed and are negative.   Physical Exam: Vitals:   05/29/16 1005  BP: 90/68  Pulse: 76   Body mass index is 21.06 kg/m. Gen:      No acute distress HEENT:  EOMI, sclera anicteric Neck:     No masses; no thyromegaly Lungs:    Clear to auscultation bilaterally; normal respiratory effort CV:         Regular rate and rhythm; no murmurs Abd:      + bowel sounds; soft, non-tender; no palpable masses, no distension Ext:    No edema; adequate peripheral perfusion Skin:      Warm and dry; no rash Neuro: alert and oriented x 3 Psych: normal mood and affect  Data Reviewed:  Reviewed labs, radiology imaging, old records and pertinent past GI work up   Assessment and Plan/Recommendations:  70 yr F with c/o occasional Heartburn, nasal congestion post nasal drip and cough for past 1-2 months after Flu Based on history appears Heartburn is well controlled with Omeprazole 20 mg daily Occasional post prandial GERD symptoms: advised patient to use either Zantac 150mg  BID as needed or Gavison after meals as needed Discussed anti reflux measures in detail  Upper  respiratory symptoms and nasal congestion: Use anti histamines Claritin daily for 2-4 weeks to reduce the nasal congestion and post nasal drip If continues to have persistent symptoms follow up with ENT for further evaluation.  Colorectal cancer screening: 1 tubular adenoma removed. Increased risk. Due for surveillance in Nov 2019   K. Denzil Magnuson , MD [phone] Mon-Fri 8a-5p [phone] after 5p, weekends, holidays  CC: Burns, Claudina Lick, MD

## 2016-05-29 NOTE — Patient Instructions (Signed)
Take omeprazole 20 mg daily 30 minutes before breakfast  Take Zantac 150 mg after meals twice a day as needed  Use Gaviscon after meals as needed  Take Antihistamine daily Ex Claritin   Follow up as needed

## 2016-08-19 NOTE — Progress Notes (Signed)
Subjective:    Patient ID: Rachel Knight, female    DOB: 19-Jan-1951, 66 y.o.   MRN: [id number]  HPI She is here for an acute visit for cold symptoms.   Her symptoms started 5 days ago.    She is experiencing a productive cough with initially green sputum that has now turned to white.  She has had some fevers, but none in the past 24 hrs.  She has mild chest tightness.  She denies wheeze, sob, nasal congestion, sore throat and headaches.   She feels a lot better today compared to yesterday.    She has tried taking Hydromet and it has not been helping.  She has taken advil and ES tylenol.   She also tried nyquil.      Medications and allergies reviewed with patient and updated if appropriate.  Patient Active Problem List   Diagnosis Date Noted  . Gastroenteritis 02/17/2016  . Elevated troponin 02/17/2016  . Internal hemorrhoids 02/17/2016  . Anxiety and depression 01/28/2016  . 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 Right hip pain 12/10/2012  . Hypothyroidism 11/30/2009  . Vitamin D deficiency 11/30/2009  . ALLERGIC RHINITIS 11/30/2009  . Osteopenia 11/30/2009    Current Outpatient Prescriptions on File Prior to Visit  Medication Sig Dispense Refill  . acetaminophen (TYLENOL) 500 MG tablet Take 1,000 mg by mouth every 6 (six) hours as needed for mild pain.    . Ascorbic Acid (VITAMIN C) 1000 MG tablet Take 1,000 mg by mouth daily.    . Calcium Carbonate-Vitamin D (CALCIUM-VITAMIN D) 500-200 MG-UNIT per tablet Take 1 tablet by mouth daily.     . Cholecalciferol (VITAMIN D3) 2000 units TABS Take 1 tablet by mouth daily.    . citalopram (CELEXA) 20 MG tablet Take 1 tablet (20 mg total) by mouth daily. 30 tablet 11  . ipratropium (ATROVENT) 0.06 % nasal spray Place 2 sprays into both nostrils daily as needed. 15 mL 5  . levothyroxine (SYNTHROID, LEVOTHROID) 75 MCG tablet Take 1 tablet (75 mcg total) by mouth daily. 30 tablet 11  . Omega-3 Fatty Acids (FISH OIL) 1000 MG CAPS Take by mouth  daily.    Marland Kitchen omeprazole (PRILOSEC) 20 MG capsule Take 1 capsule (20 mg total) by mouth daily as needed. Yearly physical due in May must see MD for future refills 30 capsule 3  . ranitidine (ZANTAC) 150 MG tablet Take 1 tablet (150 mg total) by mouth 2 (two) times daily. After meals 60 tablet 6  . valACYclovir (VALTREX) 500 MG tablet Take 1 tablet (500 mg total) by mouth as needed. 30 tablet 2   No current facility-administered medications on file prior to visit.     Past Medical History:  Diagnosis Date  . Anxiety   . Depression   . GERD (gastroesophageal reflux disease)    occasional   . HSV-1 infection   . Hypothyroidism   . Osteopenia   . Perennial allergic rhinitis   . 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  Marland Kitchen Vitamin D deficiency     Past Surgical History:  Procedure Laterality Date  . APPENDECTOMY  2011  . AUGMENTATION MAMMAPLASTY    . BREAST ENHANCEMENT SURGERY  1996  . COLONOSCOPY  2000 , 2007   Dr Olevia Perches. Due 2017  . NECK LIFT  11/2015  . WISDOM TOOTH EXTRACTION      Social History   Social History  . Marital status: Married    Spouse name:  N/A  . Number of children: N/A  . Years of education: N/A   Social History Main Topics  . Smoking status: Former Smoker    Types: Cigarettes  . Smokeless tobacco: Never Used     Comment: only smoked age [age] , 1 ppweek  . Alcohol use 4.2 oz/week    7 Shots of liquor per week     Comment: 1 drinks/day  . Drug use: No  . Sexual activity: Yes    Partners: Male    Birth control/ protection: Post-menopausal   Other Topics Concern  . Not on file   Social History Narrative   Exercise: weights, walking, pilates, swimming    Family History  Problem Relation Age of Onset  . Stroke Father 25  . Colon polyps Father   . Other Brother 45       OS muscle paralysis , resolution over 3 days. Neg evaluation  . Breast cancer Mother 75  . Thyroid disease Mother   . Colon cancer Paternal Grandfather   .  Coronary artery disease Maternal Grandmother 63  . Colon cancer Paternal Grandmother   . Coronary artery disease Maternal Grandfather 60  . Diabetes Neg Hx   . Esophageal cancer Neg Hx   . Rectal cancer Neg Hx   . Stomach cancer Neg Hx     Review of Systems  Constitutional: Positive for fever.  HENT: Negative for congestion, ear pain, sinus pain, sinus pressure and sore throat.   Respiratory: Positive for cough (productive) and chest tightness. Negative for shortness of breath and wheezing.   Gastrointestinal: Negative for diarrhea and nausea.  Musculoskeletal: Positive for myalgias (with fever).  Neurological: Negative for dizziness, light-headedness and headaches.       Objective:   Vitals:   08/21/16 1012  BP: 98/66  Pulse: 69  Resp: 16  Temp: 98.6 F (37 C)   Filed Weights   08/21/16 1012  Weight: 127 lb (57.6 kg)   Body mass index is 20.5 kg/m.  Wt Readings from Last 3 Encounters:  08/21/16 127 lb (57.6 kg)  05/29/16 130 lb 8 oz (59.2 kg)  04/05/16 129 lb 9.6 oz (58.8 kg)     Physical Exam GENERAL APPEARANCE: Appears stated age, well appearing, NAD EYES: conjunctiva clear, no icterus HEENT: bilateral tympanic membranes and ear canals normal, oropharynx with no erythema, no thyromegaly, trachea midline, no cervical or supraclavicular lymphadenopathy LUNGS: Clear to auscultation without wheeze or crackles, unlabored breathing, good air entry bilaterally HEART: Normal S1,S2 without murmurs EXTREMITIES: Without clubbing, cyanosis, or edema        Assessment & Plan:   See Problem List for Assessment and Plan of chronic medical problems.

## 2016-08-21 ENCOUNTER — Encounter: Payer: Self-pay | Admitting: Internal Medicine

## 2016-08-21 ENCOUNTER — Ambulatory Visit (INDEPENDENT_AMBULATORY_CARE_PROVIDER_SITE_OTHER): Payer: BLUE CROSS/BLUE SHIELD | Admitting: Internal Medicine

## 2016-08-21 VITALS — BP 98/66 | HR 69 | Temp 98.6°F | Resp 16 | Wt 127.0 lb

## 2016-08-21 DIAGNOSIS — J209 Acute bronchitis, unspecified: Secondary | ICD-10-CM | POA: Insufficient documentation

## 2016-08-21 DIAGNOSIS — J40 Bronchitis, not specified as acute or chronic: Secondary | ICD-10-CM | POA: Insufficient documentation

## 2016-08-21 MED ORDER — HYDROCOD POLST-CPM POLST ER 10-8 MG/5ML PO SUER: 5.0000 mL | Freq: Two times a day (BID) | ORAL | 0 refills | Status: DC | PRN

## 2016-08-21 MED ORDER — BENZONATATE 200 MG PO CAPS: 200.0000 mg | ORAL_CAPSULE | Freq: Three times a day (TID) | ORAL | 0 refills | Status: DC | PRN

## 2016-08-21 NOTE — Assessment & Plan Note (Signed)
Likely viral in nature No antibiotic needed tussionex cough syrup at night Tessalon perles during the day Rest, fluids  Call if no improvement

## 2016-08-21 NOTE — Patient Instructions (Signed)
You were prescribed pills for your cough and cough syrup at night.  If your symptoms worsen or fail to improve, please contact our office for further instruction, or in case of emergency go directly to the emergency room at the closest medical facility.   General Recommendations:    Please drink plenty of fluids.  Get plenty of rest   Sleep in humidified air  Use saline nasal sprays  Netti pot  OTC Medications:  Decongestants - helps relieve congestion   Flonase (generic fluticasone) or Nasacort (generic triamcinolone) - please make sure to use the "cross-over" technique at a 45 degree angle towards the opposite eye as opposed to straight up the nasal passageway.   Sudafed (generic pseudoephedrine - Note this is the one that is available behind the pharmacy counter); Products with phenylephrine (-PE) may also be used but is often not as effective as pseudoephedrine.   If you have HIGH BLOOD PRESSURE - Coricidin HBP; AVOID any product that is -D as this contains pseudoephedrine which may increase your blood pressure.  Afrin (oxymetazoline) every 6-8 hours for up to 3 days.  Allergies - helps relieve runny nose, itchy eyes and sneezing   Claritin (generic loratidine), Allegra (fexofenidine), or Zyrtec (generic cyrterizine) for runny nose. These medications should not cause drowsiness.  Note - Benadryl (generic diphenhydramine) may be used however may cause drowsiness  Cough -   Delsym or Robitussin (generic dextromethorphan)  Expectorants - helps loosen mucus to ease removal   Mucinex (generic guaifenesin) as directed on the package.  Headaches / General Aches   Tylenol (generic acetaminophen) - DO NOT EXCEED 3 grams (3,000 mg) in a 24 hour time period  Advil/Motrin (generic ibuprofen)  Sore Throat -   Salt water gargle   Chloraseptic (generic benzocaine) spray or lozenges / Sucrets (generic dyclonine)

## 2016-08-22 ENCOUNTER — Telehealth: Payer: Self-pay | Admitting: Internal Medicine

## 2016-08-22 MED ORDER — AMOXICILLIN-POT CLAVULANATE 875-125 MG PO TABS: 1.0000 | ORAL_TABLET | Freq: Two times a day (BID) | ORAL | 0 refills | Status: DC

## 2016-08-22 NOTE — Telephone Encounter (Signed)
Pt saw Dr Quay Burow yesterday for a bad cold. She was told to call back if it got any worse. She said that when she woke up this morning she was feeling much worse than yesterday. Pt also had a bad reaction to the cough medication that was prescribed. Please advise.

## 2016-08-22 NOTE — Telephone Encounter (Signed)
We can try an antibiotic - pending - please send to pharmacy of choice.   She can take OTC cough medication or she can continue the cough pills.

## 2016-08-22 NOTE — Telephone Encounter (Signed)
Spoke with pt. States she would like to go ahead and have antibiotic sent to [facility]. She will not pick up RX unless she feels bad again tomorrow.

## 2016-09-11 ENCOUNTER — Ambulatory Visit (INDEPENDENT_AMBULATORY_CARE_PROVIDER_SITE_OTHER): Payer: BLUE CROSS/BLUE SHIELD | Admitting: Obstetrics and Gynecology

## 2016-09-11 ENCOUNTER — Encounter: Payer: Self-pay | Admitting: Obstetrics and Gynecology

## 2016-09-11 VITALS — BP 92/58 | HR 80 | Resp 14 | Wt 124.0 lb

## 2016-09-11 DIAGNOSIS — N952 Postmenopausal atrophic vaginitis: Secondary | ICD-10-CM | POA: Diagnosis not present

## 2016-09-11 DIAGNOSIS — N941 Unspecified dyspareunia: Secondary | ICD-10-CM | POA: Diagnosis not present

## 2016-09-11 DIAGNOSIS — N762 Acute vulvitis: Secondary | ICD-10-CM | POA: Diagnosis not present

## 2016-09-11 MED ORDER — BETAMETHASONE VALERATE 0.1 % EX OINT: TOPICAL_OINTMENT | CUTANEOUS | 0 refills | Status: DC

## 2016-09-11 MED ORDER — ESTRADIOL 10 MCG VA TABS: ORAL_TABLET | VAGINAL | 0 refills | Status: DC

## 2016-09-11 NOTE — Progress Notes (Signed)
GYNECOLOGY  VISIT   HPI: 66 y.o.   Married  Caucasian  female   G2P2002 with Patient's last menstrual period was 06/17/2011.   here c/o vaginal itching. Patient recently treated with ABX for respiratory  infection. She tried OTC monistat which causes her severe burning.  Vulvar itching and irritation started last week, tried OTC medication, caused terrible burning. She tried pro-biotics and yogurt. No abnormal discharge. No urinary c/o. She also c/o ongoing issues with entry dyspareunia, even with using a lubricant. 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   GYNECOLOGIC HISTORY: Patient's last menstrual period was 06/17/2011. Contraception:postmenopause  Menopausal hormone therapy: none         OB History    Gravida Para Term Preterm AB Living   2 2 2     2    SAB TAB Ectopic Multiple Live Births                     Patient Active Problem List   Diagnosis Date Noted  . Acute bronchitis 08/21/2016  . Gastroenteritis 02/17/2016  . Elevated troponin 02/17/2016  . Internal hemorrhoids 02/17/2016  . Anxiety and depression 01/28/2016  . 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 Right hip pain 12/10/2012  . Hypothyroidism 11/30/2009  . Vitamin D deficiency 11/30/2009  . ALLERGIC RHINITIS 11/30/2009  . Osteopenia 11/30/2009    Past Medical History:  Diagnosis Date  . Anxiety   . Depression   . GERD (gastroesophageal reflux disease)    occasional   . HSV-1 infection   . Hypothyroidism   . Osteopenia   . Perennial allergic rhinitis   . 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  Marland Kitchen Vitamin D deficiency     Past Surgical History:  Procedure Laterality Date  . APPENDECTOMY  2011  . AUGMENTATION MAMMAPLASTY    . BREAST ENHANCEMENT SURGERY  1996  . COLONOSCOPY  2000 , 2007   Dr Olevia Perches. Due 2017  . NECK LIFT  11/2015  . WISDOM TOOTH EXTRACTION      Current Outpatient Prescriptions  Medication Sig Dispense Refill  . acetaminophen (TYLENOL) 500 MG tablet Take  1,000 mg by mouth every 6 (six) hours as needed for mild pain.    . Ascorbic Acid (VITAMIN C) 1000 MG tablet Take 1,000 mg by mouth daily.    . Calcium Carbonate-Vitamin D (CALCIUM-VITAMIN D) 500-200 MG-UNIT per tablet Take 1 tablet by mouth daily.     . Cholecalciferol (VITAMIN D3) 2000 units TABS Take 1 tablet by mouth daily.    . citalopram (CELEXA) 20 MG tablet Take 1 tablet (20 mg total) by mouth daily. 30 tablet 11  . ipratropium (ATROVENT) 0.06 % nasal spray Place 2 sprays into both nostrils daily as needed. 15 mL 5  . levothyroxine (SYNTHROID, LEVOTHROID) 75 MCG tablet Take 1 tablet (75 mcg total) by mouth daily. 30 tablet 11  . omeprazole (PRILOSEC) 20 MG capsule Take 1 capsule (20 mg total) by mouth daily as needed. Yearly physical due in May must see MD for future refills 30 capsule 3  . ranitidine (ZANTAC) 150 MG tablet Take 1 tablet (150 mg total) by mouth 2 (two) times daily. After meals 60 tablet 6  . valACYclovir (VALTREX) 500 MG tablet Take 1 tablet (500 mg total) by mouth as needed. 30 tablet 2   No current facility-administered medications for this visit.      ALLERGIES: Patient has no known allergies.  Family History  Problem Relation Age of Onset  . Stroke Father 39  . Colon polyps Father   . Other Brother 45       OS muscle paralysis , resolution over 3 days. Neg evaluation  . Breast cancer Mother 82  . Thyroid disease Mother   . Colon cancer Paternal Grandfather   . Coronary artery disease Maternal Grandmother 63  . Colon cancer Paternal Grandmother   . Coronary artery disease Maternal Grandfather 60  . Diabetes Neg Hx   . Esophageal cancer Neg Hx   . Rectal cancer Neg Hx   . Stomach cancer Neg Hx     Social History   Social History  . Marital status: Married    Spouse name: N/A  . Number of children: N/A  . Years of education: N/A   Occupational History  . Not on file.   Social History Main Topics  . Smoking status: Former Smoker    Types:  Cigarettes  . Smokeless tobacco: Never Used     Comment: only smoked age [age] , 1 ppweek  . Alcohol use 4.2 oz/week    7 Shots of liquor per week     Comment: 1 drinks/day  . Drug use: No  . Sexual activity: Yes    Partners: Male    Birth control/ protection: Post-menopausal   Other Topics Concern  . Not on file   Social History Narrative   Exercise: weights, walking, pilates, swimming    Review of Systems  Constitutional: Negative.   HENT: Negative.   Eyes: Negative.   Respiratory: Negative.   Cardiovascular: Negative.   Gastrointestinal: Negative.   Genitourinary:       Vaginal itching   Musculoskeletal: Negative.   Skin: Negative.   Neurological: Negative.   Endo/Heme/Allergies: Negative.   Psychiatric/Behavioral: Negative.     PHYSICAL EXAMINATION:    BP (!) 92/58 (BP Location: Right Arm, Patient Position: Standing, Cuff Size: Normal)   Pulse 80   Resp 14   Wt 124 lb (56.2 kg)   LMP 06/17/2011   BMI 20.01 kg/m     General appearance: alert, cooperative and appears stated age  Pelvic: External genitalia:  no lesions, minimal erythema, minimal agglutination of the labia minora to majora, no whitening              Urethra:  normal appearing urethra with no masses, tenderness or lesions              Bartholins and Skenes: normal                 Vagina: normal appearing atrophic vagina with no discharge noted              Cervix: no lesions              Chaperone was present for exam.  Wet prep: no clue, no trich,  rare wbc KOH: no yeast PH: 5.5 Scant cellularity   ASSESSMENT Vulvitis, negative vaginal slides Atrophic vaginitis Dyspareunia    PLAN Will treat with steroid ointment Affirm sent Start Vagifem Continue to use vaginal lubricant Discussed vulvar skin care, information given F/U with Dr Quincy Simmonds in one month   An After Visit Summary was printed and given to the patient.  15 minutes face to face time of which over 50% was spent in  counseling.

## 2016-09-11 NOTE — Addendum Note (Signed)
Addended by: Dorothy Spark on: 09/11/2016 12:05 PM   Modules accepted: Orders

## 2016-09-12 ENCOUNTER — Telehealth: Payer: Self-pay | Admitting: Obstetrics and Gynecology

## 2016-09-12 ENCOUNTER — Telehealth: Payer: Self-pay | Admitting: Internal Medicine

## 2016-09-12 NOTE — Telephone Encounter (Signed)
Where is the headache located?  Any other concerning symptoms - nausea?  Depending on how much ibuprofen she took - she may need a higher dose - may need to take 800 mg to help with headache.   Schedule tomorrow with me or Deb if no improvement

## 2016-09-12 NOTE — Progress Notes (Signed)
Thank you :)

## 2016-09-12 NOTE — Telephone Encounter (Signed)
Patient called requesting to speak with the nurse about the "vaginal cream." She said she does not know the name of it but it will cost too much for her to fill at $230.00. She'd like to know if there is a lower cost alternative.  Pharmacy on file confirmed.

## 2016-09-12 NOTE — Telephone Encounter (Signed)
Pt would like a call back with advise, she has had a headache since last Friday, she has been to her Eye doctor and she does not have a retinal tare or anything and she had taken ibuprofen but it will not go away. Please advise

## 2016-09-12 NOTE — Telephone Encounter (Signed)
Pt called back. I gave her MD response. She said that the headache is a dull pain all over her head. This morning it was going down her neck. She said that she has light and sound sensitivity and is seeing spots. She is not experiencing any dizziness and is not nauseated. She was seen by her eye doctor this morning who said that she does not have any retina damage and did not see any evidence of floaters.  She took 800mg  of ibuprofen yesterday afternoon, 400mg  before bed and 400mg  this morning but nothing seems to be helping. She made an appointment with Debbie tomorrow, 09/13/16 at 9:15am.

## 2016-09-12 NOTE — Telephone Encounter (Signed)
Noted.  Will be seen tomorrow.

## 2016-09-12 NOTE — Telephone Encounter (Signed)
Spoke with Benjamine Mola at [facility].    - Betamethasone valerate ointment $15    - Yuvafem vaginal tablets 28 day supply $215               deductible not met, estradiol would cost more.                  Reviewed 84 day vs 28 day supply, no cost savings.   Will review alternative with provider.   Dr. Talbert Nan, would compounded vaginal Estradiol cream be an alternative option?   Cc: Dr. Quincy Simmonds

## 2016-09-13 ENCOUNTER — Ambulatory Visit (INDEPENDENT_AMBULATORY_CARE_PROVIDER_SITE_OTHER): Payer: BLUE CROSS/BLUE SHIELD | Admitting: Family Medicine

## 2016-09-13 VITALS — BP 100/70 | HR 61 | Temp 97.9°F | Ht 66.0 in | Wt 125.0 lb

## 2016-09-13 DIAGNOSIS — G43109 Migraine with aura, not intractable, without status migrainosus: Secondary | ICD-10-CM

## 2016-09-13 DIAGNOSIS — R350 Frequency of micturition: Secondary | ICD-10-CM

## 2016-09-13 LAB — POCT URINALYSIS DIPSTICK
BILIRUBIN UA: NEGATIVE
Blood, UA: NEGATIVE
GLUCOSE UA: NEGATIVE
Ketones, UA: NEGATIVE
LEUKOCYTES UA: NEGATIVE
NITRITE UA: NEGATIVE
Protein, UA: NEGATIVE
Spec Grav, UA: 1.02 (ref 1.010–1.025)
Urobilinogen, UA: 0.2 E.U./dL
pH, UA: 6 (ref 5.0–8.0)

## 2016-09-13 LAB — VAGINITIS/VAGINOSIS, DNA PROBE
Candida Species: NEGATIVE
Gardnerella vaginalis: NEGATIVE
TRICHOMONAS VAG: NEGATIVE

## 2016-09-13 MED ORDER — DICLOFENAC SODIUM 75 MG PO TBEC: 75.0000 mg | DELAYED_RELEASE_TABLET | Freq: Two times a day (BID) | ORAL | 0 refills | Status: DC

## 2016-09-13 NOTE — Progress Notes (Signed)
Pre visit review using our clinic review tool, if applicable. No additional management support is needed unless otherwise documented below in the visit note. 

## 2016-09-13 NOTE — Telephone Encounter (Signed)
Spoke with patient, advised as seen below per Dr. Talbert Nan. Patient states she went ahead and picked up Yuvafem vaginal tablets, will complete this prescription and discuss less expensive alternative at 1 month f/u with Dr. Quincy Simmonds. Scheduled patient for f/u OV on 10/18/16 at 10:30am with Dr. Quincy Simmonds. Patient thankful for assistance, verbalizes understanding and is agreeable.    Notes recorded by Salvadore Dom, MD on 09/13/2016 at 10:53 AM EDT Please let her know that the vaginitis panel is negative. I saw the note about the vagifem being too expensive. We can call in the compounded estradiol cream 1 gram per vagina q hs x 1 week, then 1 gram 2 x a week at hs.  # 30 gram, no refills   Cc: Dr. Quincy Simmonds

## 2016-09-13 NOTE — Patient Instructions (Signed)
I have sent in a different anti-inflammatory to your pharmacy- use this instead of ibuprofen  Migraine Headache A migraine headache is an intense, throbbing pain on one side or both sides of the head. Migraines may also cause other symptoms, such as nausea, vomiting, and sensitivity to light and noise. What are the causes? Doing or taking certain things may also trigger migraines, such as:  Alcohol.  Smoking.  Medicines, such as: ? Medicine used to treat chest pain (nitroglycerine). ? Birth control pills. ? Estrogen pills. ? Certain blood pressure medicines.  Aged cheeses, chocolate, or caffeine.  Foods or drinks that contain nitrates, glutamate, aspartame, or tyramine.  Physical activity.  Other things that may trigger a migraine include:  Menstruation.  Pregnancy.  Hunger.  Stress, lack of sleep, too much sleep, or fatigue.  Weather changes.  What increases the risk? The following factors may make you more likely to experience migraine headaches:  Age. Risk increases with age.  Family history of migraine headaches.  Being Caucasian.  Depression and anxiety.  Obesity.  Being a woman.  Having a hole in the heart (patent foramen ovale) or other heart problems.  What are the signs or symptoms? The main symptom of this condition is pulsating or throbbing pain. Pain may:  Happen in any area of the head, such as on one side or both sides.  Interfere with daily activities.  Get worse with physical activity.  Get worse with exposure to bright lights or loud noises.  Other symptoms may include:  Nausea.  Vomiting.  Dizziness.  General sensitivity to bright lights, loud noises, or smells.  Before you get a migraine, you may get warning signs that a migraine is developing (aura). An aura may include:  Seeing flashing lights or having blind spots.  Seeing bright spots, halos, or zigzag lines.  Having tunnel vision or blurred vision.  Having  numbness or a tingling feeling.  Having trouble talking.  Having muscle weakness.  How is this diagnosed? A migraine headache can be diagnosed based on:  Your symptoms.  A physical exam.  Tests, such as CT scan or MRI of the head. These imaging tests can help rule out other causes of headaches.  Taking fluid from the spine (lumbar puncture) and analyzing it (cerebrospinal fluid analysis, or CSF analysis).  How is this treated? A migraine headache is usually treated with medicines that:  Relieve pain.  Relieve nausea.  Prevent migraines from coming back.  Treatment may also include:  Acupuncture.  Lifestyle changes like avoiding foods that trigger migraines.  Follow these instructions at home: Medicines  Take over-the-counter and prescription medicines only as told by your health care provider.  Do not drive or use heavy machinery while taking prescription pain medicine.  To prevent or treat constipation while you are taking prescription pain medicine, your health care provider may recommend that you: ? Drink enough fluid to keep your urine clear or pale yellow. ? Take over-the-counter or prescription medicines. ? Eat foods that are high in fiber, such as fresh fruits and vegetables, whole grains, and beans. ? Limit foods that are high in fat and processed sugars, such as fried and sweet foods. Lifestyle  Avoid alcohol use.  Do not use any products that contain nicotine or tobacco, such as cigarettes and e-cigarettes. If you need help quitting, ask your health care provider.  Get at least 8 hours of sleep every night.  Limit your stress. General instructions   Keep a journal to find  out what may trigger your migraine headaches. For example, write down: ? What you eat and drink. ? How much sleep you get. ? Any change to your diet or medicines.  If you have a migraine: ? Avoid things that make your symptoms worse, such as bright lights. ? It may help to lie  down in a dark, quiet room. ? Do not drive or use heavy machinery. ? Ask your health care provider what activities are safe for you while you are experiencing symptoms.  Keep all follow-up visits as told by your health care provider. This is important. Contact a health care provider if:  You develop symptoms that are different or more severe than your usual migraine symptoms. Get help right away if:  Your migraine becomes severe.  You have a fever.  You have a stiff neck.  You have vision loss.  Your muscles feel weak or like you cannot control them.  You start to lose your balance often.  You develop trouble walking.  You faint. This information is not intended to replace advice given to you by your health care provider. Make sure you discuss any questions you have with your health care provider. Document Released: 02/20/2005 Document Revised: 09/10/2015 Document Reviewed: 08/09/2015 Elsevier Interactive Patient Education  2017 [employer].

## 2016-09-13 NOTE — Progress Notes (Signed)
Subjective:    Patient ID: Rachel Knight, female    DOB: 12/04/1950, 66 y.o.   MRN: [id number]  HPI This is a 66 yo female who presents today with migraine x 5 days, started when she got overheated with exercise. Does not usually have headaches, had one severe headache in college. She was outside exercising, had a cocktail with vodka, had onset of headache with floaters, occasional flash of aura. Saw ophthalmologist yesterday who said exam is normal. Headache is band across front of head, some radiation down neck. Some light and sound sensitivity, these symptoms have been improving. Sleeping well. Took ibuprofen 800 mg last night. Day before she took 600 mg total ibuprofen. Took ibuprofen 400 mg at headache onset then took several days off because did not seem to help. No dizziness, no blurred vision, no double vision, no nausea/vomiting.   Has noticed some urinary frequency, not sure if related to increased fluid intake with warm weather. No dysuria, no fever/chills, no back pain.  Past Medical History:  Diagnosis Date  . Anxiety   . Depression   . GERD (gastroesophageal reflux disease)    occasional   . HSV-1 infection   . Hypothyroidism   . Osteopenia   . Perennial allergic rhinitis   . 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  Marland Kitchen Vitamin D deficiency    Past Surgical History:  Procedure Laterality Date  . APPENDECTOMY  2011  . AUGMENTATION MAMMAPLASTY    . BREAST ENHANCEMENT SURGERY  1996  . COLONOSCOPY  2000 , 2007   Dr Olevia Perches. Due 2017  . NECK LIFT  11/2015  . WISDOM TOOTH EXTRACTION     Family History  Problem Relation Age of Onset  . Stroke Father 13  . Colon polyps Father   . Other Brother 45       OS muscle paralysis , resolution over 3 days. Neg evaluation  . Breast cancer Mother 4  . Thyroid disease Mother   . Colon cancer Paternal Grandfather   . Coronary artery disease Maternal Grandmother 63  . Colon cancer Paternal Grandmother   . Coronary  artery disease Maternal Grandfather 60  . Diabetes Neg Hx   . Esophageal cancer Neg Hx   . Rectal cancer Neg Hx   . Stomach cancer Neg Hx   . Social History  Substance Use Topics  . Smoking status: Former Smoker    Types: Cigarettes  . Smokeless tobacco: Never Used     Comment: only smoked age [age] , 1 ppweek  . Alcohol use 4.2 oz/week    7 Shots of liquor per week     Comment: 1 drinks/day      Review of Systems Per HPI    Objective:   Physical Exam  Constitutional: She is oriented to person, place, and time. She appears well-developed and well-nourished. No distress.  HENT:  Head: Normocephalic and atraumatic.  Eyes: Conjunctivae and EOM are normal. Pupils are equal, round, and reactive to light. Right eye exhibits no discharge. Left eye exhibits no discharge.  Neck: Normal range of motion. Neck supple.  Cardiovascular: Normal rate, regular rhythm and normal heart sounds.   Pulmonary/Chest: Effort normal and breath sounds normal.  Musculoskeletal: Normal range of motion. She exhibits no edema.  Lymphadenopathy:    She has no cervical adenopathy.  Neurological: She is alert and oriented to person, place, and time. She has normal reflexes. No cranial nerve deficit. Coordination normal.  Skin: Skin is  warm and dry. She is not diaphoretic.  Psychiatric: She has a normal mood and affect. Her behavior is normal. Judgment and thought content normal.  Vitals reviewed.     BP 100/70 (BP Location: Right Arm, Patient Position: Sitting, Cuff Size: Normal)   Pulse 61   Temp 97.9 F (36.6 C) (Oral)   Ht 5\' 6"  (1.676 m)   Wt 125 lb (56.7 kg)   LMP 06/17/2011   SpO2 100%   BMI 20.18 kg/m  Wt Readings from Last 3 Encounters:  09/13/16 125 lb (56.7 kg)  09/11/16 124 lb (56.2 kg)  08/21/16 127 lb (57.6 kg)   Results for orders placed or performed in visit on 09/13/16  POCT urinalysis dipstick  Result Value Ref Range   Color, UA light yellow    Clarity, UA clear     Glucose, UA negative    Bilirubin, UA negative    Ketones, UA negative    Spec Grav, UA 1.020 1.010 - 1.025   Blood, UA negative    pH, UA 6.0 5.0 - 8.0   Protein, UA negative    Urobilinogen, UA 0.2 0.2 or 1.0 E.U./dL   Nitrite, UA negative    Leukocytes, UA Negative Negative       Assessment & Plan:  1. Migraine with aura and without status migrainosus, not intractable - has improved slightly, given age and new onset of moderately severe headache, discussed getting CT of the head, patient agreed. - diclofenac (VOLTAREN) 75 MG EC tablet; Take 1 tablet (75 mg total) by mouth 2 (two) times daily.  Dispense: 30 tablet; Refill: 0 - CT Head Wo Contrast; Future - RTC/ED precautions reviewed  2. Urinary frequency - urine normal, likely related to increased fluid intake - POCT urinalysis dipstick   Clarene Reamer, FNP-BC   [facility] [place]  09/13/2016 1:00 PM

## 2016-09-13 NOTE — Telephone Encounter (Signed)
Please see result note. Yes, we can call in compounded vaginal estradiol (instructions in the phone note)

## 2016-09-20 ENCOUNTER — Ambulatory Visit: Payer: BLUE CROSS/BLUE SHIELD | Admitting: Internal Medicine

## 2016-09-20 ENCOUNTER — Inpatient Hospital Stay: Admission: RE | Admit: 2016-09-20 | Payer: BLUE CROSS/BLUE SHIELD | Source: Ambulatory Visit

## 2016-09-21 ENCOUNTER — Ambulatory Visit (INDEPENDENT_AMBULATORY_CARE_PROVIDER_SITE_OTHER)
Admission: RE | Admit: 2016-09-21 | Discharge: 2016-09-21 | Disposition: A | Discharge status: Home / Self Care | Payer: BLUE CROSS/BLUE SHIELD | Source: Ambulatory Visit | Attending: Family Medicine | Admitting: Family Medicine

## 2016-09-21 DIAGNOSIS — G43109 Migraine with aura, not intractable, without status migrainosus: Secondary | ICD-10-CM | POA: Diagnosis not present

## 2016-10-18 ENCOUNTER — Ambulatory Visit: Payer: Self-pay | Admitting: Obstetrics and Gynecology

## 2017-03-12 ENCOUNTER — Other Ambulatory Visit: Payer: Self-pay | Admitting: Internal Medicine

## 2017-03-13 ENCOUNTER — Other Ambulatory Visit: Payer: Self-pay | Admitting: Internal Medicine

## 2017-03-27 DIAGNOSIS — K219 Gastro-esophageal reflux disease without esophagitis: Secondary | ICD-10-CM | POA: Insufficient documentation

## 2017-03-27 DIAGNOSIS — T781XXA Other adverse food reactions, not elsewhere classified, initial encounter: Secondary | ICD-10-CM | POA: Insufficient documentation

## 2017-04-02 NOTE — Progress Notes (Addendum)
67 y.o. G7P2002 Married Caucasian female here for annual exam.    Some heat/cold intolerance.   Denies vaginal bleeding or spotting.   Seeing an Dealer at [facility].  Doing bone care through supplements and having tx of reflux.   Wants refills of Citalopram, Vagifem, Valtrex and Synthroid. Takes Valtrex for oral HSV. States she had TFTs through integrative specialist in [place].   Going to [place] and [place] in March.   PCP:   Billey Gosling, MD  Patient's last menstrual period was 06/17/2011.           Sexually active: Yes.    The current method of family planning is post menopausal status.    Exercising: Yes.    pilates, walking, weight lifting, and swimming Smoker:  no  Health Maintenance: Pap:  03/16/16 Pap and HR HPV negative History of abnormal Pap:  no MMG: 03/23/17 BIRADS 1 negative/density b Colonoscopy:  01-11-16 polyp with [facility];next due 01/2021 BMD:   2018 [facility].  Reports pending.  TDaP:  2010 Gardasil:   N/A HIV: neg 07/21/15 Hep C: neg 07/21/15 Screening Labs: PCP   reports that she has quit smoking. Her smoking use included cigarettes. she has never used smokeless tobacco. She reports that she drinks about 4.2 oz of alcohol per week. She reports that she does not use drugs.  Past Medical History:  Diagnosis Date  . Anxiety   . Depression   . GERD (gastroesophageal reflux disease)    occasional   . HSV-1 infection   . Hypothyroidism   . Osteopenia   . Perennial allergic rhinitis   . 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  Marland Kitchen Vitamin D deficiency     Past Surgical History:  Procedure Laterality Date  . APPENDECTOMY  2011  . AUGMENTATION MAMMAPLASTY    . BREAST ENHANCEMENT SURGERY  1996  . COLONOSCOPY  2000 , 2007   Dr Olevia Perches. Due 2017  . NECK LIFT  11/2015  . WISDOM TOOTH EXTRACTION      Current Outpatient Medications  Medication Sig Dispense Refill  . betamethasone valerate ointment (VALISONE) 0.1 % Apply a pea  sized amount topically BID for 1-2 weeks 15 g 0  . Cholecalciferol (VITAMIN D3) 2000 units TABS Take 2 tablets by mouth daily.     . citalopram (CELEXA) 20 MG tablet Take 1 1/2 tablets by mouth daily (15 mg daily). 135 tablet 3  . Cyanocobalamin (B-12 PO) Take by mouth daily.    . Estradiol 10 MCG TABS vaginal tablet Place one tablet (10 mcg) in the vagina twice weekly. 24 tablet 3  . ipratropium (ATROVENT) 0.06 % nasal spray Place 2 sprays into both nostrils daily as needed for rhinitis. --- Office visit needed for further refills 15 mL 0  . levothyroxine (SYNTHROID, LEVOTHROID) 75 MCG tablet Take 1 tablet (75 mcg total) by mouth daily. 90 tablet 3  . Multiple Vitamins-Minerals (OSTEOPRIME ULTRA) TABS     . NON FORMULARY DGL Plus Supplement    . NON FORMULARY Magnesium Malate    . NON FORMULARY Marshmallow root    . omeprazole (PRILOSEC) 20 MG capsule Take 1 capsule (20 mg total) by mouth daily as needed. ---Office visit needed for further refills 30 capsule 0  . valACYclovir (VALTREX) 1000 MG tablet Take 2 tablets (2000 mg) by mouth twice a day for 1 day. 30 tablet 0   No current facility-administered medications for this visit.     Family History  Problem  Relation Age of Onset  . Stroke Father 62  . Colon polyps Father   . Other Brother 45       OS muscle paralysis , resolution over 3 days. Neg evaluation  . Breast cancer Mother 5  . Thyroid disease Mother   . Colon cancer Paternal Grandfather   . Coronary artery disease Maternal Grandmother 63  . Colon cancer Paternal Grandmother   . Coronary artery disease Maternal Grandfather 60  . Diabetes Neg Hx   . Esophageal cancer Neg Hx   . Rectal cancer Neg Hx   . Stomach cancer Neg Hx     ROS:  Pertinent items are noted in HPI.  Otherwise, a comprehensive ROS was negative.  Exam:   BP (!) 108/58 (BP Location: Right Arm, Patient Position: Sitting, Cuff Size: Normal)   Pulse 72   Resp 16   Ht 5' 6.25" (1.683 m)   Wt 124 lb (56.2  kg)   LMP 06/17/2011   BMI 19.86 kg/m     General appearance: alert, cooperative and appears stated age Head: Normocephalic, without obvious abnormality, atraumatic Neck: no adenopathy, supple, symmetrical, trachea midline and thyroid normal to inspection and palpation Lungs: clear to auscultation bilaterally Breasts: bilateral implants, normal appearance, no masses or tenderness, No nipple retraction or dimpling, No nipple discharge or bleeding, No axillary or supraclavicular adenopathy Heart: regular rate and rhythm Abdomen: soft, non-tender; no masses, no organomegaly Extremities: extremities normal, atraumatic, no cyanosis or edema Skin: Skin color, texture, turgor normal. No rashes or lesions Lymph nodes: Cervical, supraclavicular, and axillary nodes normal. No abnormal inguinal nodes palpated Neurologic: Grossly normal  Pelvic: External genitalia:  no lesions              Urethra:  normal appearing urethra with no masses, tenderness or lesions              Bartholins and Skenes: normal                 Vagina: normal appearing vagina with normal color and discharge, no lesions              Cervix: no lesions              Pap taken: No. Bimanual Exam:  Uterus:  normal size, contour, position, consistency, mobility, non-tender              Adnexa: no mass, fullness, tenderness              Rectal exam: Yes.  .  Confirms.              Anus:  normal sphincter tone, no lesions  Chaperone was present for exam.  Assessment:   Well woman visit with normal exam. Osteopenia.  Bilateral breast implants.  FH of breast cancer.  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 Valtrex prn. Hypothyroidism.  Depression and anxiety.  On Citalopram.  Is trying to reduce the level.  Atrophy of vagina.  Plan: Mammogram screening discussed. Recommended self breast awareness. Pap and HR HPV as above. Guidelines for Calcium, Vitamin D, regular exercise program including cardiovascular and weight bearing  exercise. Will get labs from [facility].  Refill of Synthroid, Citalopram, Vagifem, and Valtrex.  Vaccines through PCP.  Will get copy of BMD from [facility].  Follow up annually and prn.   After visit summary provided.   Addendum 04/19/17: Lab results from 02/10/17 received from [facility].  Will be scanned into Epic.  TSH 1.2. Free T4  2.1. Free T3 2.9.  Free T4 is listed as elevated.   Will contact patient to have her return for a repeat in her TFTs.

## 2017-04-04 ENCOUNTER — Telehealth: Payer: Self-pay | Admitting: *Deleted

## 2017-04-04 ENCOUNTER — Other Ambulatory Visit: Payer: Self-pay

## 2017-04-04 ENCOUNTER — Ambulatory Visit (INDEPENDENT_AMBULATORY_CARE_PROVIDER_SITE_OTHER): Payer: BLUE CROSS/BLUE SHIELD | Admitting: Obstetrics and Gynecology

## 2017-04-04 ENCOUNTER — Encounter: Payer: Self-pay | Admitting: Obstetrics and Gynecology

## 2017-04-04 VITALS — BP 108/58 | HR 72 | Resp 16 | Ht 66.25 in | Wt 124.0 lb

## 2017-04-04 DIAGNOSIS — Z01419 Encounter for gynecological examination (general) (routine) without abnormal findings: Secondary | ICD-10-CM

## 2017-04-04 MED ORDER — ESTRADIOL 10 MCG VA TABS: ORAL_TABLET | VAGINAL | 3 refills | Status: DC

## 2017-04-04 MED ORDER — VALACYCLOVIR HCL 1 G PO TABS: ORAL_TABLET | ORAL | 0 refills | Status: DC

## 2017-04-04 MED ORDER — CITALOPRAM HYDROBROMIDE 20 MG PO TABS: ORAL_TABLET | ORAL | 3 refills | Status: DC

## 2017-04-04 MED ORDER — LEVOTHYROXINE SODIUM 75 MCG PO TABS: 75.0000 ug | ORAL_TABLET | Freq: Every day | ORAL | 3 refills | Status: DC

## 2017-04-04 NOTE — Patient Instructions (Signed)

## 2017-04-04 NOTE — Telephone Encounter (Signed)
Please close encounter

## 2017-04-04 NOTE — Telephone Encounter (Signed)
Spoke with Ulice Brilliant at [facility]. Advised awaiting return call from patient to confirm RX. Will update after speaking with patient.

## 2017-04-04 NOTE — Telephone Encounter (Signed)
I think this needs to be clarified once again with the patient based on what is in Epic.   If she is taking Citalpram 15 mg total daily, her Rx needs to be as follows: Citalopram 10 mg Sig:  Take 1 and 1/2 tablet by mouth daily (15 mg total).  Dispense:  135 tablets, RF 3.

## 2017-04-04 NOTE — Telephone Encounter (Signed)
Left message to call Hyun Marsalis at [phone].  

## 2017-04-04 NOTE — Telephone Encounter (Signed)
Trista calling from [facility] to clarify Rx for citalopram 20 mg tab.   Instruction read take 1 1/2 tabs po daily (15mg ). If dispensed as written would be 30 mg daily, please clarify and return call to [phone].   Dr. Quincy Simmonds -please clarify daily dosage of citalopram.

## 2017-04-05 MED ORDER — CITALOPRAM HYDROBROMIDE 10 MG PO TABS: 15.0000 mg | ORAL_TABLET | Freq: Every day | ORAL | 3 refills | Status: DC

## 2017-04-05 NOTE — Telephone Encounter (Signed)
Call returned to pharmacy, spoke with [place]. Cancelled RX for citalopram 20 mg tab, advised new RX sent for Citalopram 10 mg tab, 15 mg daily. Rx cancelled.

## 2017-04-05 NOTE — Telephone Encounter (Signed)
New order signed, and this encounter is closed.

## 2017-04-05 NOTE — Telephone Encounter (Signed)
Spoke with patient to clarify citalopram dosing. Patient states she is taking 15 mg daily. Has been cutting 20 mg tab in 1/4 and taking 3/4 tab. Patient request 10 mg tab.   Advised will send new RX for citalopram 10 mg tab. Take 1 1/2 tabs (15 mg) daily. Patient verbalizes understanding and is agreeable.     Rx for citalopram to [facility].   Patient f/u to see if BMD and MMG received and reviewed by Dr. Quincy Simmonds. Advised will f/u with Dr. Quincy Simmonds and return call once received and reviewed. Patient is agreeable.    Routing to Dr. Quincy Simmonds  Dr. Quincy Simmonds -did you request and receive BMD and MMG?

## 2017-04-10 ENCOUNTER — Encounter: Payer: Self-pay | Admitting: Obstetrics and Gynecology

## 2017-04-12 ENCOUNTER — Telehealth: Payer: Self-pay | Admitting: *Deleted

## 2017-04-12 NOTE — Telephone Encounter (Signed)
Left message to call Sharee Pimple at [phone].  Review BMD results dated 03/23/17.

## 2017-04-13 NOTE — Telephone Encounter (Signed)
Spoke with patient, reviewed 03/23/17 BMD results from [facility], per Dr. Quincy Simmonds. Patient verbalizes understanding and is agreeable.   Results to scan.   Routing to provider for final review. Patient is agreeable to disposition. Will close encounter.

## 2017-04-19 ENCOUNTER — Telehealth: Payer: Self-pay | Admitting: Obstetrics and Gynecology

## 2017-04-19 DIAGNOSIS — E039 Hypothyroidism, unspecified: Secondary | ICD-10-CM

## 2017-04-19 NOTE — Telephone Encounter (Signed)
Opened in error

## 2017-04-19 NOTE — Telephone Encounter (Signed)
Lab results from 02/10/17 received from [facility].  Will be scanned into Epic.  TSH 1.2. Free T4 2.1. Free T3 2.9.  Free T4 is listed as elevated.   Please have patient come to the office for a lab recheck.  I will place future orders for thyroid function studies.

## 2017-04-20 NOTE — Telephone Encounter (Signed)
Left message to call Rachel Knight at [phone]. 

## 2017-04-20 NOTE — Telephone Encounter (Signed)
Spoke with patient. Advised of message as seen below from Tees Toh. Patient verbalizes understanding. Lab appointment scheduled for 04/23/2017 at 1:30 pm. Patient is agreeable to date and time.  Routing to provider for final review. Patient agreeable to disposition. Will close encounter.

## 2017-04-23 ENCOUNTER — Other Ambulatory Visit: Payer: BLUE CROSS/BLUE SHIELD

## 2017-04-24 ENCOUNTER — Other Ambulatory Visit (INDEPENDENT_AMBULATORY_CARE_PROVIDER_SITE_OTHER): Payer: BLUE CROSS/BLUE SHIELD

## 2017-04-24 DIAGNOSIS — E039 Hypothyroidism, unspecified: Secondary | ICD-10-CM

## 2017-04-25 LAB — T3, FREE: T3, Free: 2.7 pg/mL (ref 2.0–4.4)

## 2017-04-25 LAB — TSH: TSH: 2.7 u[IU]/mL (ref 0.450–4.500)

## 2017-04-25 LAB — T4, FREE: Free T4: 1.61 ng/dL (ref 0.82–1.77)

## 2017-04-30 ENCOUNTER — Telehealth: Payer: Self-pay | Admitting: Obstetrics and Gynecology

## 2017-04-30 NOTE — Telephone Encounter (Signed)
Spoke with patient.  2/24 -reports brown, sticky, thin "liquid" vaginal d/c, no odor.  Denies any other gyn symptoms, urinary complaints, or pain.   Has been using estradiol vaginal tablet weekly since August 2018. Patient reports only using one tablet weekly d/t cost.   Recommended OV for further evaluation, OV scheduled for 05/03/17 at 12:30pm with Dr. Quincy Simmonds. Patient declined earlier appointments offered, states she is moving her father into hospice. Advised will review with Dr. Quincy Simmonds, will return call with any additional recommendations. Patient is agreeable.   Dr. Quincy Simmonds -any additional recommendations?

## 2017-04-30 NOTE — Telephone Encounter (Signed)
Patient called with questions for the nurse about her estradiol and "light colored vaginal discharge with no odor." She said she started having some discharge yesterday and she wants to see if that's normal with this medication.

## 2017-04-30 NOTE — Telephone Encounter (Signed)
Left message to call Rosalva Neary at [phone].  

## 2017-04-30 NOTE — Telephone Encounter (Signed)
I agree with office visit.  You may close the encounter.  

## 2017-05-02 ENCOUNTER — Telehealth: Payer: Self-pay | Admitting: Obstetrics and Gynecology

## 2017-05-02 NOTE — Telephone Encounter (Signed)
Patient cancelled problem visit appointment for tomorrow because she is feeling better and will monitor the discharge.

## 2017-05-02 NOTE — Telephone Encounter (Signed)
Patient was to be seen for brown, sticky, thin "liquid" vaginal d/c, no odor. Denied any other gyn symptoms, urinary complaints, or pain. Patient has been using Estradiol vaginal tablets once weekly. Patient cancelled appointment and does not wish to reschedule at this time. Will monitor and call back to schedule an appointment.  Routing to provider for final review. Patient agreeable to disposition. Will close encounter.

## 2017-05-03 ENCOUNTER — Ambulatory Visit: Payer: Self-pay | Admitting: Obstetrics and Gynecology

## 2017-05-03 ENCOUNTER — Telehealth: Payer: Self-pay

## 2017-05-03 NOTE — Telephone Encounter (Signed)
Copied from [place] [id number]. Topic: Inquiry >> May 03, 2017  2:31 PM Oliver Pila B wrote: Reason for CRM: pt called to be put on the wait list for the Shingrix vaccine, contact pt to advise  Patient has been added to shingrix waitlist

## 2017-07-05 ENCOUNTER — Ambulatory Visit (INDEPENDENT_AMBULATORY_CARE_PROVIDER_SITE_OTHER): Payer: Medicare Other

## 2017-07-05 DIAGNOSIS — Z23 Encounter for immunization: Secondary | ICD-10-CM | POA: Diagnosis not present

## 2017-07-05 NOTE — Progress Notes (Signed)
Injection given.   Christyne Mccain J Natika Geyer, MD  

## 2017-11-07 ENCOUNTER — Encounter: Payer: BLUE CROSS/BLUE SHIELD | Admitting: Internal Medicine

## 2017-11-13 NOTE — Progress Notes (Signed)
Subjective:    Patient ID: Rachel Knight, female    DOB: 10-16-1950, 67 y.o.   MRN: [id number]  HPI  Medications and allergies reviewed with patient and updated if appropriate.  Patient Active Problem List   Diagnosis Date Noted  . Acute bronchitis 08/21/2016  . Gastroenteritis 02/17/2016  . Elevated troponin 02/17/2016  . Internal hemorrhoids 02/17/2016  . Anxiety and depression 01/28/2016  . 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 Right hip pain 12/10/2012  . Hypothyroidism 11/30/2009  . Vitamin D deficiency 11/30/2009  . ALLERGIC RHINITIS 11/30/2009  . Osteopenia 11/30/2009    Current Outpatient Medications on File Prior to Visit  Medication Sig Dispense Refill  . betamethasone valerate ointment (VALISONE) 0.1 % Apply a pea sized amount topically BID for 1-2 weeks 15 g 0  . Cholecalciferol (VITAMIN D3) 2000 units TABS Take 2 tablets by mouth daily.     . citalopram (CELEXA) 10 MG tablet Take 1.5 tablets (15 mg total) by mouth daily. 135 tablet 3  . Cyanocobalamin (B-12 PO) Take by mouth daily.    . Estradiol 10 MCG TABS vaginal tablet Place one tablet (10 mcg) in the vagina twice weekly. 24 tablet 3  . ipratropium (ATROVENT) 0.06 % nasal spray Place 2 sprays into both nostrils daily as needed for rhinitis. --- Office visit needed for further refills 15 mL 0  . levothyroxine (SYNTHROID, LEVOTHROID) 75 MCG tablet Take 1 tablet (75 mcg total) by mouth daily. 90 tablet 3  . Multiple Vitamins-Minerals (OSTEOPRIME ULTRA) TABS     . NON FORMULARY DGL Plus Supplement    . NON FORMULARY Magnesium Malate    . NON FORMULARY Marshmallow root    . omeprazole (PRILOSEC) 20 MG capsule Take 1 capsule (20 mg total) by mouth daily as needed. ---Office visit needed for further refills 30 capsule 0  . valACYclovir (VALTREX) 1000 MG tablet Take 2 tablets (2000 mg) by mouth twice a day for 1 day. 30 tablet 0   No current facility-administered medications on file prior to visit.     Past Medical  History:  Diagnosis Date  . Anxiety   . Depression   . GERD (gastroesophageal reflux disease)    occasional   . HSV-1 infection   . Hypothyroidism   . Osteopenia   . Perennial allergic rhinitis   . 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  Marland Kitchen Vitamin D deficiency     Past Surgical History:  Procedure Laterality Date  . APPENDECTOMY  2011  . AUGMENTATION MAMMAPLASTY    . BREAST ENHANCEMENT SURGERY  1996  . COLONOSCOPY  2000 , 2007   Dr Olevia Perches. Due 2017  . NECK LIFT  11/2015  . WISDOM TOOTH EXTRACTION      Social History   Socioeconomic History  . Marital status: Married    Spouse name: Not on file  . Number of children: Not on file  . Years of education: Not on file  . Highest education level: Not on file  Occupational History  . Not on file  Social Needs  . Financial resource strain: Not on file  . Food insecurity:    Worry: Not on file    Inability: Not on file  . Transportation needs:    Medical: Not on file    Non-medical: Not on file  Tobacco Use  . Smoking status: Former Smoker    Types: Cigarettes  . Smokeless tobacco: Never Used  . Tobacco comment: only smoked age  [age] , 1 ppweek  Substance and Sexual Activity  . Alcohol use: Yes    Alcohol/week: 7.0 standard drinks    Types: 7 Shots of liquor per week    Comment: 1 drinks/day  . Drug use: No  . Sexual activity: Yes    Partners: Male    Birth control/protection: Post-menopausal  Lifestyle  . Physical activity:    Days per week: Not on file    Minutes per session: Not on file  . Stress: Not on file  Relationships  . Social connections:    Talks on phone: Not on file    Gets together: Not on file    Attends religious service: Not on file    Active member of club or organization: Not on file    Attends meetings of clubs or organizations: Not on file    Relationship status: Not on file  Other Topics Concern  . Not on file  Social History Narrative   Exercise: weights, walking,  pilates, swimming    Family History  Problem Relation Age of Onset  . Stroke Father 13  . Colon polyps Father   . Other Brother 45       OS muscle paralysis , resolution over 3 days. Neg evaluation  . Breast cancer Mother 46  . Thyroid disease Mother   . Colon cancer Paternal Grandfather   . Coronary artery disease Maternal Grandmother 63  . Colon cancer Paternal Grandmother   . Coronary artery disease Maternal Grandfather 60  . Diabetes Neg Hx   . Esophageal cancer Neg Hx   . Rectal cancer Neg Hx   . Stomach cancer Neg Hx     Review of Systems     Objective:  There were no vitals filed for this visit. There were no vitals filed for this visit. There is no height or weight on file to calculate BMI.  Wt Readings from Last 3 Encounters:  04/04/17 124 lb (56.2 kg)  09/13/16 125 lb (56.7 kg)  09/11/16 124 lb (56.2 kg)     Physical Exam      Assessment & Plan:       This encounter was created in error - please disregard.

## 2017-11-14 ENCOUNTER — Encounter: Payer: Medicare Other | Admitting: Internal Medicine

## 2017-11-19 ENCOUNTER — Ambulatory Visit: Payer: Medicare Other | Admitting: Internal Medicine

## 2017-12-04 NOTE — Patient Instructions (Addendum)
  Rachel Knight , Thank you for taking time to come for your Medicare Wellness Visit. I appreciate your ongoing commitment to your health goals. Please review the following plan we discussed and let me know if I can assist you in the future.   These are the goals we discussed: Goals   None     This is a list of the screening recommended for you and due dates:  Health Maintenance  Topic Date Due  . Pneumonia vaccines (1 of 2 - PCV13) 07/18/2017  . Flu Shot  10/04/2017  . Tetanus Vaccine  03/06/2018  . Pap Smear  03/17/2019  . Mammogram  03/24/2019  . DEXA scan (bone density measurement)  03/23/2020  . Colon Cancer Screening  01/10/2021  .  Hepatitis C: One time screening is recommended by Center for Disease Control  (CDC) for  adults born from [birth year] through 1963.   Completed  . HIV Screening  Completed     Tests ordered today. Your results will be released to [place] (or called to you) after review, usually within 72hours after test completion. If any changes need to be made, you will be notified at that same time.  All other Health Maintenance issues reviewed.   All recommended immunizations and age-appropriate screenings are up-to-date or discussed.  No immunizations administered today.   Medications reviewed and updated.  Changes include :   Anti-inflammatory gel - Pennsaid.  Your prescription(s) have been submitted to your pharmacy. Please take as directed and contact our office if you believe you are having problem(s) with the medication(s).   Please followup in one year

## 2017-12-04 NOTE — Progress Notes (Signed)
Subjective:    Patient ID: Rachel Knight, female    DOB: 1951/02/08, 67 y.o.   MRN: [id number]  HPI Here for a welcome to medicare wellness exam and follow up of her chronic medical problems.   I have personally reviewed and have noted 1.The patient's medical and social history 2.Their use of alcohol, tobacco or illicit drugs 3.Their current medications and supplements 4.The patient's functional ability including ADL's, fall risks, home                 safety risk and hearing or visual impairment. 5.Diet and physical activities 6.Evidence for depression or mood disorders 7.Care team reviewed  -  Gyn - Dr Quincy Simmonds, Eye doctor, dermatology  Hypothyroidism:  She is taking her medication daily.  She denies any recent changes in energy or weight that are unexplained.   Anxiety, depression: She is taking her medication daily as prescribed. She denies any side effects from the medication. She feels her anxiety and depression are well controlled and she is happy with her current dose of medication.   Osteopenia:  Her gyn monitors her bone density.  She is exercising regularly.  She takes vitamin D on a regular basis.  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  Needs a referral for OT - has appt on 10/8.  She needs a referral ordered.  She has already done physical therapy.  She takes advil, but is concerned about upsetting her stomach and knows she should not take it long-term.    Are there smokers in your home (other than you)? No  Risk Factors Exercise:   Regular -probably exercises too much Dietary issues discussed:  No gluten, no dairy  Vitamin and supplement use:  reviewee  Opiod use:   none  Cardiac risk factors: advanced age  Depression Screen  Have you felt down, depressed or hopeless? No  Have you felt little interest or pleasure in doing things?  No  Activities of Daily Living In your present state of  health, do you have any difficulty performing the following activities?:  Driving? No Managing money?  No Feeding yourself? No Getting from bed to chair? No Climbing a flight of stairs? No Preparing food and eating?: No Bathing or showering? No Getting dressed: No Getting to/using the toilet? No Moving around from place to place: No In the past year have you fallen or had a near fall?: No   Are you sexually active?  Yes   Do you have more than one partner?  No   Hearing Difficulties: No Do you often ask people to speak up or repeat themselves? No Do you experience ringing or noises in your ears? No Do you have difficulty understanding soft or whispered voices? No Vision:              Any change in vision:  no             Up to date with eye exam:   Up to date    Memory:  Do you feel that you have a problem with memory? No  Do you often misplace items? No  Do you feel safe at home?  Yes  Cognitive Testing  Alert, Orientated? Yes  Normal Appearance? Yes  Recall of three objects?  Yes  Can perform simple calculations? Yes  Displays appropriate judgment? Yes  Can read the correct time from a watch face? Yes   Advanced Directives have been discussed with the patient?  Yes     Medications and allergies reviewed with patient and updated if appropriate.  Patient Active Problem List   Diagnosis Date Noted  . Arthralgia of left temporomandibular joint 12/05/2017  . Elevated troponin 02/17/2016  . Internal hemorrhoids 02/17/2016  . Anxiety and depression 01/28/2016  . 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 Right hip pain 12/10/2012  . Hypothyroidism 11/30/2009  . Vitamin D deficiency 11/30/2009  . Allergic rhinitis 11/30/2009  . Osteopenia 11/30/2009    Current Outpatient Medications on File Prior to Visit  Medication Sig Dispense Refill  . Cholecalciferol (VITAMIN D3) 2000 units TABS Take 2 tablets by mouth daily.     . citalopram (CELEXA) 10 MG tablet Take 1.5 tablets (15 mg  total) by mouth daily. 135 tablet 3  . Estradiol 10 MCG TABS vaginal tablet Place one tablet (10 mcg) in the vagina twice weekly. 24 tablet 3  . ipratropium (ATROVENT) 0.06 % nasal spray Place 2 sprays into both nostrils daily as needed for rhinitis. --- Office visit needed for further refills 15 mL 0  . levothyroxine (SYNTHROID, LEVOTHROID) 75 MCG tablet Take 1 tablet (75 mcg total) by mouth daily. 90 tablet 3  . Multiple Vitamins-Minerals (OSTEOPRIME ULTRA) TABS     . NON FORMULARY Magnesium Malate    . valACYclovir (VALTREX) 1000 MG tablet Take 2 tablets (2000 mg) by mouth twice a day for 1 day. 30 tablet 0   No current facility-administered medications on file prior to visit.     Past Medical History:  Diagnosis Date  . Anxiety   . Depression   . GERD (gastroesophageal reflux disease)    occasional   . HSV-1 infection   . Hypothyroidism   . Osteopenia   . Perennial allergic rhinitis   . 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  Marland Kitchen Vitamin D deficiency     Past Surgical History:  Procedure Laterality Date  . APPENDECTOMY  2011  . AUGMENTATION MAMMAPLASTY    . BREAST ENHANCEMENT SURGERY  1996  . COLONOSCOPY  2000 , 2007   Dr Olevia Perches. Due 2017  . NECK LIFT  11/2015  . WISDOM TOOTH EXTRACTION      Social History   Socioeconomic History  . Marital status: Married    Spouse name: Not on file  . Number of children: Not on file  . Years of education: Not on file  . Highest education level: Not on file  Occupational History  . Not on file  Social Needs  . Financial resource strain: Not on file  . Food insecurity:    Worry: Not on file    Inability: Not on file  . Transportation needs:    Medical: Not on file    Non-medical: Not on file  Tobacco Use  . Smoking status: Former Smoker    Types: Cigarettes  . Smokeless tobacco: Never Used  . Tobacco comment: only smoked age [age] , 1 ppweek  Substance and Sexual Activity  . Alcohol use: Yes     Alcohol/week: 7.0 standard drinks    Types: 7 Shots of liquor per week    Comment: 1 drinks/day  . Drug use: No  . Sexual activity: Yes    Partners: Male    Birth control/protection: Post-menopausal  Lifestyle  . Physical activity:    Days per week: Not on file    Minutes per session: Not on file  . Stress: Not on file  Relationships  . Social connections:  Talks on phone: Not on file    Gets together: Not on file    Attends religious service: Not on file    Active member of club or organization: Not on file    Attends meetings of clubs or organizations: Not on file    Relationship status: Not on file  Other Topics Concern  . Not on file  Social History Narrative   Exercise: weights, walking, pilates, swimming    Family History  Problem Relation Age of Onset  . Stroke Father 44  . Colon polyps Father   . Other Brother 45       OS muscle paralysis , resolution over 3 days. Neg evaluation  . Breast cancer Mother 39  . Thyroid disease Mother   . Colon cancer Paternal Grandfather   . Coronary artery disease Maternal Grandmother 63  . Colon cancer Paternal Grandmother   . Coronary artery disease Maternal Grandfather 60  . Diabetes Neg Hx   . Esophageal cancer Neg Hx   . Rectal cancer Neg Hx   . Stomach cancer Neg Hx     Review of Systems  Constitutional: Negative for chills, fatigue and fever.  HENT: Positive for hearing loss and tinnitus.   Respiratory: Negative for cough, shortness of breath and wheezing.   Cardiovascular: Negative for chest pain, palpitations and leg swelling.  Gastrointestinal: Positive for constipation (occ). Negative for abdominal pain, blood in stool, diarrhea and nausea.       Occ indigestion  Genitourinary: Negative for dysuria and hematuria.  Musculoskeletal: Positive for arthralgias (mild).  Skin: Negative for color change and rash.  Neurological: Negative for light-headedness and headaches (with TMJ).  Psychiatric/Behavioral: Negative  for dysphoric mood. The patient is not nervous/anxious.        Objective:   Vitals:   12/05/17 0833  BP: 108/68  Pulse: 64  Resp: 16  Temp: 98 F (36.7 C)  SpO2: 98%   Filed Weights   12/05/17 0833  Weight: 125 lb (56.7 kg)   Body mass index is 20.02 kg/m.  Wt Readings from Last 3 Encounters:  12/05/17 125 lb (56.7 kg)  04/04/17 124 lb (56.2 kg)  09/13/16 125 lb (56.7 kg)     Visual Acuity Screening   Right eye Left eye Both eyes  Without correction: 20/30 20/30 20/25   With correction:         Physical Exam Constitutional: She appears well-developed and well-nourished. No distress.  HENT:  Head: Normocephalic and atraumatic.  Right Ear: External ear normal. Normal ear canal and TM Left Ear: External ear normal.  Normal ear canal and TM Mouth/Throat: Oropharynx is clear and moist.  Eyes: Conjunctivae and EOM are normal.  Neck: Neck supple. No tracheal deviation present. No thyromegaly present.  No carotid bruit  Cardiovascular: Normal rate, regular rhythm and normal heart sounds.   No murmur heard.  No edema. Pulmonary/Chest: Effort normal and breath sounds normal. No respiratory distress. She has no wheezes. She has no rales.  Breast: deferred to Gyn Abdominal: Soft. She exhibits no distension. There is no tenderness.  Lymphadenopathy: She has no cervical adenopathy.  Skin: Skin is warm and dry. She is not diaphoretic.  Psychiatric: She has a normal mood and affect. Her behavior is normal.        Assessment & Plan:   Welcome to [employer] [facility] [facility] Exam: Blood work  - ordered Immunizations   Flu vaccine deferred-we will get work,  prevnar declined today but she will come back to get  it,  Had first shingles Colonoscopy   Up to date  Mammogram   Up to date  Dexa   Up to date  Gyn   Up to date  Eye exam  Up to date  Hearing loss   None Memory concerns/difficulties   None   Independent of ADLs   fully EKG done today: Sinus bradycardia at 59 bpm, negative  precordial leads, no significant change from prior EKGs   Patient received copy of preventative screening tests/immunizations recommended for the next 5-10 years.    See Problem List for Assessment and Plan of chronic medical problems.    Fu 1 year

## 2017-12-05 ENCOUNTER — Other Ambulatory Visit (INDEPENDENT_AMBULATORY_CARE_PROVIDER_SITE_OTHER): Payer: Medicare Other

## 2017-12-05 ENCOUNTER — Encounter: Payer: Self-pay | Admitting: Internal Medicine

## 2017-12-05 ENCOUNTER — Ambulatory Visit (INDEPENDENT_AMBULATORY_CARE_PROVIDER_SITE_OTHER): Payer: Medicare Other | Admitting: Internal Medicine

## 2017-12-05 VITALS — BP 108/68 | HR 64 | Temp 98.0°F | Resp 16 | Ht 66.25 in | Wt 125.0 lb

## 2017-12-05 DIAGNOSIS — M85851 Other specified disorders of bone density and structure, right thigh: Secondary | ICD-10-CM

## 2017-12-05 DIAGNOSIS — F329 Major depressive disorder, single episode, unspecified: Secondary | ICD-10-CM | POA: Diagnosis not present

## 2017-12-05 DIAGNOSIS — Z0000 Encounter for general adult medical examination without abnormal findings: Secondary | ICD-10-CM | POA: Diagnosis not present

## 2017-12-05 DIAGNOSIS — F419 Anxiety disorder, unspecified: Secondary | ICD-10-CM | POA: Diagnosis not present

## 2017-12-05 DIAGNOSIS — J309 Allergic rhinitis, unspecified: Secondary | ICD-10-CM | POA: Diagnosis not present

## 2017-12-05 DIAGNOSIS — E038 Other specified hypothyroidism: Secondary | ICD-10-CM

## 2017-12-05 DIAGNOSIS — M85852 Other specified disorders of bone density and structure, left thigh: Secondary | ICD-10-CM

## 2017-12-05 DIAGNOSIS — M26622 Arthralgia of left temporomandibular joint: Secondary | ICD-10-CM

## 2017-12-05 DIAGNOSIS — Z23 Encounter for immunization: Secondary | ICD-10-CM | POA: Diagnosis not present

## 2017-12-05 LAB — CBC WITH DIFFERENTIAL/PLATELET
BASOS PCT: 1 % (ref 0.0–3.0)
Basophils Absolute: 0.1 10*3/uL (ref 0.0–0.1)
EOS ABS: 0.1 10*3/uL (ref 0.0–0.7)
Eosinophils Relative: 1.9 % (ref 0.0–5.0)
HEMATOCRIT: 43.9 % (ref 36.0–46.0)
HEMOGLOBIN: 15.2 g/dL — AB (ref 12.0–15.0)
Lymphocytes Relative: 36.7 % (ref 12.0–46.0)
Lymphs Abs: 1.9 10*3/uL (ref 0.7–4.0)
MCHC: 34.7 g/dL (ref 30.0–36.0)
MCV: 91.7 fl (ref 78.0–100.0)
Monocytes Absolute: 0.5 10*3/uL (ref 0.1–1.0)
Monocytes Relative: 9.4 % (ref 3.0–12.0)
NEUTROS ABS: 2.7 10*3/uL (ref 1.4–7.7)
Neutrophils Relative %: 51 % (ref 43.0–77.0)
Platelets: 231 10*3/uL (ref 150.0–400.0)
RBC: 4.79 Mil/uL (ref 3.87–5.11)
RDW: 13.1 % (ref 11.5–15.5)
WBC: 5.2 10*3/uL (ref 4.0–10.5)

## 2017-12-05 LAB — LIPID PANEL
CHOL/HDL RATIO: 2
Cholesterol: 191 mg/dL (ref 0–200)
HDL: 84.7 mg/dL (ref >39.00)
LDL CALC: 94 mg/dL (ref 0–99)
NONHDL: 106.73
TRIGLYCERIDES: 62 mg/dL (ref 0.0–149.0)
VLDL: 12.4 mg/dL (ref 0.0–40.0)

## 2017-12-05 LAB — COMPREHENSIVE METABOLIC PANEL
ALT: 17 U/L (ref 0–35)
AST: 20 U/L (ref 0–37)
Albumin: 4.7 g/dL (ref 3.5–5.2)
Alkaline Phosphatase: 55 U/L (ref 39–117)
BUN: 22 mg/dL (ref 6–23)
CALCIUM: 9.9 mg/dL (ref 8.4–10.5)
CHLORIDE: 107 meq/L (ref 96–112)
CO2: 24 meq/L (ref 19–32)
CREATININE: 0.82 mg/dL (ref 0.40–1.20)
GFR: 74.27 mL/min (ref >60.00)
Glucose, Bld: 98 mg/dL (ref 70–99)
Potassium: 4.4 mEq/L (ref 3.5–5.1)
SODIUM: 140 meq/L (ref 135–145)
Total Bilirubin: 1.1 mg/dL (ref 0.2–1.2)
Total Protein: 7.3 g/dL (ref 6.0–8.3)

## 2017-12-05 LAB — TSH: TSH: 2.44 u[IU]/mL (ref 0.35–4.50)

## 2017-12-05 MED ORDER — DICLOFENAC SODIUM 2 % TD SOLN: TRANSDERMAL | 1 refills | Status: DC

## 2017-12-05 NOTE — Assessment & Plan Note (Signed)
Takes Atrovent nasal spray daily Overall controlled

## 2017-12-05 NOTE — Assessment & Plan Note (Signed)
Controlled, stable Continue current dose of medication  

## 2017-12-05 NOTE — Assessment & Plan Note (Signed)
Bone density monitored by gynecologist Bone density at this date up-to-date Continue vitamin D Continue regular exercise

## 2017-12-05 NOTE — Assessment & Plan Note (Signed)
Clinically euthyroid Check tsh  Titrate med dose if needed  

## 2017-12-05 NOTE — Assessment & Plan Note (Signed)
Has seen the dentist, has done physical therapy Will start occupational therapy-needs referral, which I have ordered Taking Advil as needed-we will try Pennsaid topically-sent to pharmacy

## 2017-12-06 ENCOUNTER — Other Ambulatory Visit: Payer: Self-pay | Admitting: Internal Medicine

## 2017-12-06 MED ORDER — DICLOFENAC SODIUM 1 % TD GEL: TRANSDERMAL | 5 refills | Status: DC

## 2017-12-12 ENCOUNTER — Ambulatory Visit (INDEPENDENT_AMBULATORY_CARE_PROVIDER_SITE_OTHER): Payer: Medicare Other | Admitting: Emergency Medicine

## 2017-12-12 DIAGNOSIS — Z23 Encounter for immunization: Secondary | ICD-10-CM | POA: Diagnosis not present

## 2017-12-13 NOTE — Progress Notes (Signed)
Rachel Knight - 67 y.o. female MRN [id number]  Date of birth: 06-21-1950  SUBJECTIVE:  Including CC & ROS.  Chief Complaint  Patient presents with  . Back Pain    Rachel Knight is a 67 y.o. female that is presenting with low back pain/gluteal pain. She has been taking golf lessons last week and noticed some right lower back/hip pian. Denies tingling or numbness in her legs. Denies shooting pain in her leg. She has been applying heat/ice and taking Advil with some improvement. Pain is mild to severe when she walks, she has been changing her gait due to the pain. Denies injury or prior surgeries.     Review of Systems  Constitutional: Negative for fever.  HENT: Negative for congestion.   Respiratory: Negative for cough.   Cardiovascular: Negative for chest pain.  Gastrointestinal: Negative for abdominal pain.  Musculoskeletal: Positive for back pain.  Skin: Negative for color change.  Neurological: Negative for weakness.  Hematological: Negative for adenopathy.  Psychiatric/Behavioral: Negative for agitation.    HISTORY: Past Medical, Surgical, Social, and Family History Reviewed & Updated per EMR.   Pertinent Historical Findings include:  Past Medical History:  Diagnosis Date  . Anxiety   . Depression   . GERD (gastroesophageal reflux disease)    occasional   . HSV-1 infection   . Hypothyroidism   . Osteopenia   . Perennial allergic rhinitis   . 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  Marland Kitchen Vitamin D deficiency     Past Surgical History:  Procedure Laterality Date  . APPENDECTOMY  2011  . AUGMENTATION MAMMAPLASTY    . BREAST ENHANCEMENT SURGERY  1996  . COLONOSCOPY  2000 , 2007   Dr Olevia Perches. Due 2017  . NECK LIFT  11/2015  . WISDOM TOOTH EXTRACTION      Allergies  Allergen Reactions  . Other     Family History  Problem Relation Age of Onset  . Stroke Father 63  . Colon polyps Father   . Other Brother 45       OS muscle paralysis ,  resolution over 3 days. Neg evaluation  . Breast cancer Mother 21  . Thyroid disease Mother   . Colon cancer Paternal Grandfather   . Coronary artery disease Maternal Grandmother 63  . Colon cancer Paternal Grandmother   . Coronary artery disease Maternal Grandfather 60  . Diabetes Neg Hx   . Esophageal cancer Neg Hx   . Rectal cancer Neg Hx   . Stomach cancer Neg Hx      Social History   Socioeconomic History  . Marital status: Married    Spouse name: Not on file  . Number of children: Not on file  . Years of education: Not on file  . Highest education level: Not on file  Occupational History  . Not on file  Social Needs  . Financial resource strain: Not on file  . Food insecurity:    Worry: Not on file    Inability: Not on file  . Transportation needs:    Medical: Not on file    Non-medical: Not on file  Tobacco Use  . Smoking status: Former Smoker    Types: Cigarettes  . Smokeless tobacco: Never Used  . Tobacco comment: only smoked age [age] , 1 ppweek  Substance and Sexual Activity  . Alcohol use: Yes    Alcohol/week: 7.0 standard drinks    Types: 7 Shots of liquor per week  Comment: 1 drinks/day  . Drug use: No  . Sexual activity: Yes    Partners: Male    Birth control/protection: Post-menopausal  Lifestyle  . Physical activity:    Days per week: Not on file    Minutes per session: Not on file  . Stress: Not on file  Relationships  . Social connections:    Talks on phone: Not on file    Gets together: Not on file    Attends religious service: Not on file    Active member of club or organization: Not on file    Attends meetings of clubs or organizations: Not on file    Relationship status: Not on file  . Intimate partner violence:    Fear of current or ex partner: Not on file    Emotionally abused: Not on file    Physically abused: Not on file    Forced sexual activity: Not on file  Other Topics Concern  . Not on file  Social History Narrative     Exercise: weights, walking, pilates, swimming     PHYSICAL EXAM:  VS: BP 122/66   Pulse 66   Ht 5' 6.25" (1.683 m)   Wt 127 lb (57.6 kg)   LMP 06/17/2011   SpO2 99%   BMI 20.34 kg/m  Physical Exam Gen: NAD, alert, cooperative with exam, well-appearing ENT: normal lips, normal nasal mucosa,  Eye: normal EOM, normal conjunctiva and lids CV:  no edema, +2 pedal pulses   Resp: no accessory muscle use, non-labored,   Skin: no rashes, no areas of induration  Neuro: normal tone, normal sensation to touch Psych:  normal insight, alert and oriented MSK:  Back/Right hip: Mild tenderness to palpation over the piriformis/gluteus medius. No tenderness to palpation over the lumbar spine. No tenderness to palpation over the greater trochanter. Normal flexion extension. Normal hip internal rotation external rotation. Normal strength resistance with hip flexion. Negative straight leg raise bilaterally. Neurovascular intact     ASSESSMENT & PLAN:   Right hip pain Having pain in the gluteal region.  Some tenderness palpation of the piriformis.  Does not appear to be the SI joint.  No significant weakness with hip abduction or reproduction of the pain.  No radicular symptoms. -Counseled on home exercise therapy. -Counseled supportive care. -If no improvement consider imaging or physical therapy.  Could consider injection.

## 2017-12-14 ENCOUNTER — Encounter: Payer: Self-pay | Admitting: Family Medicine

## 2017-12-14 ENCOUNTER — Ambulatory Visit (INDEPENDENT_AMBULATORY_CARE_PROVIDER_SITE_OTHER): Payer: Medicare Other | Admitting: Family Medicine

## 2017-12-14 DIAGNOSIS — M25551 Pain in right hip: Secondary | ICD-10-CM | POA: Diagnosis not present

## 2017-12-14 NOTE — Patient Instructions (Signed)
Nice to meet you  Please try massage and heat over the area  Let pain be your guide to performing activities.  Please try the exercises  Please see me back in 3-4 weeks if the pain isn't improving.

## 2017-12-16 NOTE — Assessment & Plan Note (Signed)
Having pain in the gluteal region.  Some tenderness palpation of the piriformis.  Does not appear to be the SI joint.  No significant weakness with hip abduction or reproduction of the pain.  No radicular symptoms. -Counseled on home exercise therapy. -Counseled supportive care. -If no improvement consider imaging or physical therapy.  Could consider injection.

## 2018-01-07 DIAGNOSIS — M9902 Segmental and somatic dysfunction of thoracic region: Secondary | ICD-10-CM | POA: Diagnosis not present

## 2018-01-07 DIAGNOSIS — M50122 Cervical disc disorder at C5-C6 level with radiculopathy: Secondary | ICD-10-CM | POA: Diagnosis not present

## 2018-01-07 DIAGNOSIS — M9901 Segmental and somatic dysfunction of cervical region: Secondary | ICD-10-CM | POA: Diagnosis not present

## 2018-01-07 DIAGNOSIS — M5384 Other specified dorsopathies, thoracic region: Secondary | ICD-10-CM | POA: Diagnosis not present

## 2018-01-08 DIAGNOSIS — M50122 Cervical disc disorder at C5-C6 level with radiculopathy: Secondary | ICD-10-CM | POA: Diagnosis not present

## 2018-01-08 DIAGNOSIS — M5384 Other specified dorsopathies, thoracic region: Secondary | ICD-10-CM | POA: Diagnosis not present

## 2018-01-08 DIAGNOSIS — M9902 Segmental and somatic dysfunction of thoracic region: Secondary | ICD-10-CM | POA: Diagnosis not present

## 2018-01-08 DIAGNOSIS — M9901 Segmental and somatic dysfunction of cervical region: Secondary | ICD-10-CM | POA: Diagnosis not present

## 2018-01-10 DIAGNOSIS — M5384 Other specified dorsopathies, thoracic region: Secondary | ICD-10-CM | POA: Diagnosis not present

## 2018-01-10 DIAGNOSIS — M50122 Cervical disc disorder at C5-C6 level with radiculopathy: Secondary | ICD-10-CM | POA: Diagnosis not present

## 2018-01-10 DIAGNOSIS — M9902 Segmental and somatic dysfunction of thoracic region: Secondary | ICD-10-CM | POA: Diagnosis not present

## 2018-01-10 DIAGNOSIS — M9901 Segmental and somatic dysfunction of cervical region: Secondary | ICD-10-CM | POA: Diagnosis not present

## 2018-01-22 DIAGNOSIS — M5384 Other specified dorsopathies, thoracic region: Secondary | ICD-10-CM | POA: Diagnosis not present

## 2018-01-22 DIAGNOSIS — M50122 Cervical disc disorder at C5-C6 level with radiculopathy: Secondary | ICD-10-CM | POA: Diagnosis not present

## 2018-01-22 DIAGNOSIS — M9902 Segmental and somatic dysfunction of thoracic region: Secondary | ICD-10-CM | POA: Diagnosis not present

## 2018-01-22 DIAGNOSIS — M9901 Segmental and somatic dysfunction of cervical region: Secondary | ICD-10-CM | POA: Diagnosis not present

## 2018-01-24 DIAGNOSIS — M50122 Cervical disc disorder at C5-C6 level with radiculopathy: Secondary | ICD-10-CM | POA: Diagnosis not present

## 2018-01-24 DIAGNOSIS — M9901 Segmental and somatic dysfunction of cervical region: Secondary | ICD-10-CM | POA: Diagnosis not present

## 2018-01-24 DIAGNOSIS — M5384 Other specified dorsopathies, thoracic region: Secondary | ICD-10-CM | POA: Diagnosis not present

## 2018-01-24 DIAGNOSIS — M9902 Segmental and somatic dysfunction of thoracic region: Secondary | ICD-10-CM | POA: Diagnosis not present

## 2018-01-29 DIAGNOSIS — M9902 Segmental and somatic dysfunction of thoracic region: Secondary | ICD-10-CM | POA: Diagnosis not present

## 2018-01-29 DIAGNOSIS — M50122 Cervical disc disorder at C5-C6 level with radiculopathy: Secondary | ICD-10-CM | POA: Diagnosis not present

## 2018-01-29 DIAGNOSIS — M5384 Other specified dorsopathies, thoracic region: Secondary | ICD-10-CM | POA: Diagnosis not present

## 2018-01-29 DIAGNOSIS — M9901 Segmental and somatic dysfunction of cervical region: Secondary | ICD-10-CM | POA: Diagnosis not present

## 2018-02-06 DIAGNOSIS — D225 Melanocytic nevi of trunk: Secondary | ICD-10-CM | POA: Diagnosis not present

## 2018-02-06 DIAGNOSIS — L821 Other seborrheic keratosis: Secondary | ICD-10-CM | POA: Diagnosis not present

## 2018-02-06 DIAGNOSIS — D2261 Melanocytic nevi of right upper limb, including shoulder: Secondary | ICD-10-CM | POA: Diagnosis not present

## 2018-02-06 DIAGNOSIS — D1801 Hemangioma of skin and subcutaneous tissue: Secondary | ICD-10-CM | POA: Diagnosis not present

## 2018-02-06 DIAGNOSIS — I788 Other diseases of capillaries: Secondary | ICD-10-CM | POA: Diagnosis not present

## 2018-02-06 DIAGNOSIS — D2272 Melanocytic nevi of left lower limb, including hip: Secondary | ICD-10-CM | POA: Diagnosis not present

## 2018-02-06 DIAGNOSIS — L738 Other specified follicular disorders: Secondary | ICD-10-CM | POA: Diagnosis not present

## 2018-02-06 DIAGNOSIS — D2271 Melanocytic nevi of right lower limb, including hip: Secondary | ICD-10-CM | POA: Diagnosis not present

## 2018-02-07 DIAGNOSIS — M9902 Segmental and somatic dysfunction of thoracic region: Secondary | ICD-10-CM | POA: Diagnosis not present

## 2018-02-07 DIAGNOSIS — M50122 Cervical disc disorder at C5-C6 level with radiculopathy: Secondary | ICD-10-CM | POA: Diagnosis not present

## 2018-02-07 DIAGNOSIS — M5384 Other specified dorsopathies, thoracic region: Secondary | ICD-10-CM | POA: Diagnosis not present

## 2018-02-07 DIAGNOSIS — M9901 Segmental and somatic dysfunction of cervical region: Secondary | ICD-10-CM | POA: Diagnosis not present

## 2018-02-13 DIAGNOSIS — M9901 Segmental and somatic dysfunction of cervical region: Secondary | ICD-10-CM | POA: Diagnosis not present

## 2018-02-13 DIAGNOSIS — M9902 Segmental and somatic dysfunction of thoracic region: Secondary | ICD-10-CM | POA: Diagnosis not present

## 2018-02-13 DIAGNOSIS — M50122 Cervical disc disorder at C5-C6 level with radiculopathy: Secondary | ICD-10-CM | POA: Diagnosis not present

## 2018-02-13 DIAGNOSIS — M5384 Other specified dorsopathies, thoracic region: Secondary | ICD-10-CM | POA: Diagnosis not present

## 2018-02-14 DIAGNOSIS — M50122 Cervical disc disorder at C5-C6 level with radiculopathy: Secondary | ICD-10-CM | POA: Diagnosis not present

## 2018-02-14 DIAGNOSIS — M9901 Segmental and somatic dysfunction of cervical region: Secondary | ICD-10-CM | POA: Diagnosis not present

## 2018-02-14 DIAGNOSIS — M5384 Other specified dorsopathies, thoracic region: Secondary | ICD-10-CM | POA: Diagnosis not present

## 2018-02-14 DIAGNOSIS — M9902 Segmental and somatic dysfunction of thoracic region: Secondary | ICD-10-CM | POA: Diagnosis not present

## 2018-02-20 DIAGNOSIS — M50122 Cervical disc disorder at C5-C6 level with radiculopathy: Secondary | ICD-10-CM | POA: Diagnosis not present

## 2018-02-20 DIAGNOSIS — M9902 Segmental and somatic dysfunction of thoracic region: Secondary | ICD-10-CM | POA: Diagnosis not present

## 2018-02-20 DIAGNOSIS — M5384 Other specified dorsopathies, thoracic region: Secondary | ICD-10-CM | POA: Diagnosis not present

## 2018-02-20 DIAGNOSIS — M9901 Segmental and somatic dysfunction of cervical region: Secondary | ICD-10-CM | POA: Diagnosis not present

## 2018-03-12 DIAGNOSIS — M9902 Segmental and somatic dysfunction of thoracic region: Secondary | ICD-10-CM | POA: Diagnosis not present

## 2018-03-12 DIAGNOSIS — M50122 Cervical disc disorder at C5-C6 level with radiculopathy: Secondary | ICD-10-CM | POA: Diagnosis not present

## 2018-03-12 DIAGNOSIS — M9901 Segmental and somatic dysfunction of cervical region: Secondary | ICD-10-CM | POA: Diagnosis not present

## 2018-03-12 DIAGNOSIS — M5384 Other specified dorsopathies, thoracic region: Secondary | ICD-10-CM | POA: Diagnosis not present

## 2018-03-28 DIAGNOSIS — M9901 Segmental and somatic dysfunction of cervical region: Secondary | ICD-10-CM | POA: Diagnosis not present

## 2018-03-28 DIAGNOSIS — M50122 Cervical disc disorder at C5-C6 level with radiculopathy: Secondary | ICD-10-CM | POA: Diagnosis not present

## 2018-03-28 DIAGNOSIS — M9902 Segmental and somatic dysfunction of thoracic region: Secondary | ICD-10-CM | POA: Diagnosis not present

## 2018-03-28 DIAGNOSIS — M5384 Other specified dorsopathies, thoracic region: Secondary | ICD-10-CM | POA: Diagnosis not present

## 2018-04-03 DIAGNOSIS — M5384 Other specified dorsopathies, thoracic region: Secondary | ICD-10-CM | POA: Diagnosis not present

## 2018-04-03 DIAGNOSIS — M50122 Cervical disc disorder at C5-C6 level with radiculopathy: Secondary | ICD-10-CM | POA: Diagnosis not present

## 2018-04-03 DIAGNOSIS — M9901 Segmental and somatic dysfunction of cervical region: Secondary | ICD-10-CM | POA: Diagnosis not present

## 2018-04-03 DIAGNOSIS — M9902 Segmental and somatic dysfunction of thoracic region: Secondary | ICD-10-CM | POA: Diagnosis not present

## 2018-04-10 ENCOUNTER — Ambulatory Visit: Payer: BLUE CROSS/BLUE SHIELD | Admitting: Obstetrics and Gynecology

## 2018-04-15 ENCOUNTER — Other Ambulatory Visit: Payer: Self-pay | Admitting: Obstetrics and Gynecology

## 2018-04-15 DIAGNOSIS — M9901 Segmental and somatic dysfunction of cervical region: Secondary | ICD-10-CM | POA: Diagnosis not present

## 2018-04-15 DIAGNOSIS — M9902 Segmental and somatic dysfunction of thoracic region: Secondary | ICD-10-CM | POA: Diagnosis not present

## 2018-04-15 DIAGNOSIS — M50122 Cervical disc disorder at C5-C6 level with radiculopathy: Secondary | ICD-10-CM | POA: Diagnosis not present

## 2018-04-15 DIAGNOSIS — M5384 Other specified dorsopathies, thoracic region: Secondary | ICD-10-CM | POA: Diagnosis not present

## 2018-04-15 NOTE — Telephone Encounter (Signed)
Medication refill request: Levothyroxine Last AEX:  04/04/17 BS Next AEX: 04/30/18 Last MMG (if hormonal medication request): 03/23/17 BIRADS 1 negative/density b Refill authorized: 04/04/17 #90 w/3 refills; today please advise; Order pended for #30 w/0 refills

## 2018-04-17 ENCOUNTER — Other Ambulatory Visit: Payer: Self-pay | Admitting: Obstetrics and Gynecology

## 2018-04-17 ENCOUNTER — Ambulatory Visit: Payer: Self-pay | Admitting: Obstetrics and Gynecology

## 2018-04-24 DIAGNOSIS — M9902 Segmental and somatic dysfunction of thoracic region: Secondary | ICD-10-CM | POA: Diagnosis not present

## 2018-04-24 DIAGNOSIS — M50122 Cervical disc disorder at C5-C6 level with radiculopathy: Secondary | ICD-10-CM | POA: Diagnosis not present

## 2018-04-24 DIAGNOSIS — M9901 Segmental and somatic dysfunction of cervical region: Secondary | ICD-10-CM | POA: Diagnosis not present

## 2018-04-24 DIAGNOSIS — M5384 Other specified dorsopathies, thoracic region: Secondary | ICD-10-CM | POA: Diagnosis not present

## 2018-04-30 ENCOUNTER — Other Ambulatory Visit: Payer: Self-pay

## 2018-04-30 ENCOUNTER — Other Ambulatory Visit (HOSPITAL_COMMUNITY)
Admission: RE | Admit: 2018-04-30 | Discharge: 2018-04-30 | Disposition: A | Discharge status: Home / Self Care | Payer: Medicare Other | Source: Ambulatory Visit | Attending: Obstetrics and Gynecology | Admitting: Obstetrics and Gynecology

## 2018-04-30 ENCOUNTER — Ambulatory Visit (INDEPENDENT_AMBULATORY_CARE_PROVIDER_SITE_OTHER): Payer: Medicare Other | Admitting: Obstetrics and Gynecology

## 2018-04-30 ENCOUNTER — Encounter: Payer: Self-pay | Admitting: Obstetrics and Gynecology

## 2018-04-30 VITALS — BP 100/62 | HR 80 | Resp 16 | Ht 66.0 in | Wt 125.0 lb

## 2018-04-30 DIAGNOSIS — Z23 Encounter for immunization: Secondary | ICD-10-CM | POA: Diagnosis not present

## 2018-04-30 DIAGNOSIS — Z01419 Encounter for gynecological examination (general) (routine) without abnormal findings: Secondary | ICD-10-CM

## 2018-04-30 DIAGNOSIS — Z124 Encounter for screening for malignant neoplasm of cervix: Secondary | ICD-10-CM | POA: Diagnosis not present

## 2018-04-30 MED ORDER — LEVOTHYROXINE SODIUM 75 MCG PO TABS: 75.0000 ug | ORAL_TABLET | Freq: Every day | ORAL | 3 refills | Status: DC

## 2018-04-30 MED ORDER — ESTRADIOL 10 MCG VA TABS: ORAL_TABLET | VAGINAL | 3 refills | Status: DC

## 2018-04-30 MED ORDER — VALACYCLOVIR HCL 1 G PO TABS: ORAL_TABLET | ORAL | 0 refills | Status: DC

## 2018-04-30 MED ORDER — LEVOTHYROXINE SODIUM 75 MCG PO TABS: 75.0000 ug | ORAL_TABLET | Freq: Every day | ORAL | 11 refills | Status: DC

## 2018-04-30 NOTE — Progress Notes (Signed)
68 y.o. G35P2002 Married Caucasian female here for annual exam.    Taking L- Lysine for HSV.  Trying to wean off Citalopram. Is taking 5 mg daily and has plenty. Drinking less ETOH. Exercising.  Doing meditation.  Lots of family health issues in 2019.  Mother in her 34s and in assisted living.  Patient is also a caregiver for her.   Labs done with PCP in Fall, 2019. PCP: Dr. Billey Gosling     Patient's last menstrual period was 06/17/2011.           Sexually active: Yes.    The current method of family planning is post menopausal status.    Exercising: Yes.    yoga, pilates, walking, swimming, and bike Smoker:  no  Health Maintenance: Pap:  03/16/16 Pap and HR HPV negative History of abnormal Pap:  no MMG:  03/23/17 BIRADS 1 negative/density b Colonoscopy:  01/11/16 polyp removed; f/u 5 years BMD:   1/282019 -   Result  Osteopenia - [facility] TDaP:  2010 Gardasil:   n/a HIV and Hep C: 07/21/15 Negative Screening Labs:  PCP   reports that she has quit smoking. Her smoking use included cigarettes. She has never used smokeless tobacco. She reports current alcohol use of about 7.0 standard drinks of alcohol per week. She reports that she does not use drugs.  Past Medical History:  Diagnosis Date  . Anxiety   . Depression   . GERD (gastroesophageal reflux disease)    occasional   . HSV-1 infection   . Hypothyroidism   . Osteopenia   . Perennial allergic rhinitis   . 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  Marland Kitchen Vitamin D deficiency     Past Surgical History:  Procedure Laterality Date  . APPENDECTOMY  2011  . AUGMENTATION MAMMAPLASTY    . BREAST ENHANCEMENT SURGERY  1996  . COLONOSCOPY  2000 , 2007   Dr Olevia Perches. Due 2017  . NECK LIFT  11/2015  . WISDOM TOOTH EXTRACTION      Current Outpatient Medications  Medication Sig Dispense Refill  . Cholecalciferol (VITAMIN D3) 2000 units TABS Take 2 tablets by mouth daily.     . citalopram (CELEXA) 10 MG tablet Take  1.5 tablets (15 mg total) by mouth daily. (Patient taking differently: Take 5 mg by mouth daily. ) 135 tablet 3  . Estradiol 10 MCG TABS vaginal tablet Place one tablet (10 mcg) in the vagina twice weekly. 24 tablet 3  . ipratropium (ATROVENT) 0.06 % nasal spray Place 2 sprays into both nostrils daily as needed for rhinitis. --- Office visit needed for further refills 15 mL 0  . levothyroxine (SYNTHROID, LEVOTHROID) 75 MCG tablet Take 1 tablet (75 mcg total) by mouth daily. 90 tablet 3  . Multiple Vitamins-Minerals (OSTEOPRIME ULTRA) TABS     . NON FORMULARY Magnesium Malate    . valACYclovir (VALTREX) 1000 MG tablet Take 2 tablets (2000 mg) by mouth twice a day for 1 day. 30 tablet 0   No current facility-administered medications for this visit.     Family History  Problem Relation Age of Onset  . Stroke Father 78  . Colon polyps Father   . Other Brother 45       OS muscle paralysis , resolution over 3 days. Neg evaluation  . Breast cancer Mother 13  . Thyroid disease Mother   . Colon cancer Paternal Grandfather   . Coronary artery disease Maternal Grandmother 63  .  Colon cancer Paternal Grandmother   . Coronary artery disease Maternal Grandfather 60  . Diabetes Neg Hx   . Esophageal cancer Neg Hx   . Rectal cancer Neg Hx   . Stomach cancer Neg Hx     Review of Systems  Constitutional: Negative.   HENT: Negative.   Eyes: Negative.   Respiratory: Negative.   Cardiovascular: Negative.   Gastrointestinal: Negative.   Endocrine: Negative.   Genitourinary: Negative.   Musculoskeletal: Negative.   Skin: Negative.   Allergic/Immunologic: Negative.   Neurological: Negative.   Hematological: Negative.   Psychiatric/Behavioral: Negative.     Exam:   BP 100/62 (BP Location: Right Arm, Patient Position: Sitting, Cuff Size: Normal)   Pulse 80   Resp 16   Ht 5\' 6"  (1.676 m)   Wt 125 lb (56.7 kg)   LMP 06/17/2011   BMI 20.18 kg/m     General appearance: alert, cooperative and  appears stated age Head: Normocephalic, without obvious abnormality, atraumatic Neck: no adenopathy, supple, symmetrical, trachea midline and thyroid normal to inspection and palpation Lungs: clear to auscultation bilaterally Breasts: bilateral implants, normal appearance, no masses or tenderness, No nipple retraction or dimpling, No nipple discharge or bleeding, No axillary or supraclavicular adenopathy Heart: regular rate and rhythm Abdomen: soft, non-tender; no masses, no organomegaly Extremities: extremities normal, atraumatic, no cyanosis or edema Skin: Skin color, texture, turgor normal. No rashes or lesions Lymph nodes: Cervical, supraclavicular, and axillary nodes normal. No abnormal inguinal nodes palpated Neurologic: Grossly normal  Pelvic: External genitalia:  no lesions              Urethra:  normal appearing urethra with no masses, tenderness or lesions              Bartholins and Skenes: normal                 Vagina: normal appearing vagina with normal color and discharge, no lesions              Cervix: no lesions              Pap taken: Yes.   Bimanual Exam:  Uterus:  normal size, contour, position, consistency, mobility, non-tender              Adnexa: no mass, fullness, tenderness              Rectal exam: Yes.  .  Confirms.              Anus:  normal sphincter tone, no lesions  Chaperone was present for exam.  Assessment:   Well woman visit with normal exam. Osteopenia.  Bilateral breast implants.  FH of breast cancer in mother in her 56s.  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 Valtrex prn. Hypothyroidism. Depression and anxiety. On Citalopram. Is trying to reduce the level.  Atrophy of vagina.  Plan: Mammogram screening.  She will update this.  Recommended self breast awareness. Pap and HR HPV as above. Guidelines for Calcium, Vitamin D, regular exercise program including cardiovascular and weight bearing exercise. TDap.  Refill of Synthroid, Celexa,  Vagifem. I discussed potential effect of estrogen on breast cancer.  Follow up annually and prn.   After visit summary provided.

## 2018-04-30 NOTE — Patient Instructions (Signed)

## 2018-05-02 LAB — CYTOLOGY - PAP: Diagnosis: NEGATIVE

## 2018-05-09 ENCOUNTER — Other Ambulatory Visit: Payer: Self-pay | Admitting: Internal Medicine

## 2018-08-20 ENCOUNTER — Other Ambulatory Visit: Payer: Self-pay | Admitting: Obstetrics and Gynecology

## 2018-08-20 NOTE — Telephone Encounter (Signed)
Call to patient. Patient states she is currently taking 10 mg of citalopram daily. Pharmacy confirmed as [facility]. RN advised would update Dr. Quincy Simmonds. Patient agreeable.   Routing to provider for review.

## 2018-08-20 NOTE — Telephone Encounter (Signed)
Medication refill request: Citalopram  Last AEX:  04-30-2018 BS Next AEX: 05-07-19 Last MMG (if hormonal medication request): n/a Refill authorized: 04-05-17 #[id number], [id number]. Today, please advise.

## 2018-08-20 NOTE — Telephone Encounter (Signed)
Please contact patient to confirm her dosage of Citalopram.  At her last office visit, she was weaning off the Citalopram and was taking 5 mg daily.

## 2018-09-20 ENCOUNTER — Telehealth: Payer: Self-pay | Admitting: Internal Medicine

## 2018-09-20 ENCOUNTER — Other Ambulatory Visit: Payer: Self-pay

## 2018-09-20 DIAGNOSIS — Z20828 Contact with and (suspected) exposure to other viral communicable diseases: Secondary | ICD-10-CM

## 2018-09-20 DIAGNOSIS — Z20822 Contact with and (suspected) exposure to covid-19: Secondary | ICD-10-CM

## 2018-09-20 NOTE — Telephone Encounter (Signed)
Patient is calling to get COVID testing. Called the office to get virtual for the order. Was advised to send a message. Please advise Thank you CB- [phone]

## 2018-09-20 NOTE — Telephone Encounter (Signed)
ordered

## 2018-09-20 NOTE — Telephone Encounter (Signed)
Pt contacted and informed that the order has been placed. Pt stated that she will go today.

## 2018-09-20 NOTE — Telephone Encounter (Signed)
Patient called requesting to be tested for COVID due to exposure. She is not currently having any symptoms. Okay to enter order or would you prefer for her to have a DOXY visit?

## 2018-09-25 LAB — NOVEL CORONAVIRUS, NAA: SARS-CoV-2, NAA: NOT DETECTED

## 2018-09-27 DIAGNOSIS — H524 Presbyopia: Secondary | ICD-10-CM | POA: Diagnosis not present

## 2018-09-27 DIAGNOSIS — H2513 Age-related nuclear cataract, bilateral: Secondary | ICD-10-CM | POA: Diagnosis not present

## 2018-11-18 ENCOUNTER — Telehealth: Payer: Self-pay

## 2018-11-18 DIAGNOSIS — R1011 Right upper quadrant pain: Secondary | ICD-10-CM

## 2018-11-18 NOTE — Telephone Encounter (Signed)
I have referred to GI - if it takes too long to get in - should see me prior to our scheduled appt so I can order a ultrasound.  If she does have gallstones she will need to see surgery.

## 2018-11-18 NOTE — Telephone Encounter (Signed)
Copied from [place] [id number]. Topic: Referral - Request for Referral >> Nov 18, 2018 11:12 AM Nils Flack wrote: Has patient seen PCP for this complaint? No. *If NO, is insurance requiring patient see PCP for this issue before PCP can refer them? Referral for which specialty: gastro Preferred provider/office: [facility] Reason for referral: gallbladder issues Has had 2 "attacks" major pain right side of back. No nausea, no fever.  Pt has been eating more greasy foods during covid.  No pain right now. Gi pain when these attacks happen [phone] used to see dora broady

## 2018-11-19 ENCOUNTER — Other Ambulatory Visit: Payer: Self-pay

## 2018-11-19 ENCOUNTER — Ambulatory Visit (INDEPENDENT_AMBULATORY_CARE_PROVIDER_SITE_OTHER): Payer: Medicare Other | Admitting: Internal Medicine

## 2018-11-19 ENCOUNTER — Encounter: Payer: Self-pay | Admitting: Internal Medicine

## 2018-11-19 ENCOUNTER — Other Ambulatory Visit (INDEPENDENT_AMBULATORY_CARE_PROVIDER_SITE_OTHER): Payer: Medicare Other

## 2018-11-19 VITALS — BP 104/70 | HR 68 | Temp 97.9°F | Resp 16 | Ht 66.0 in | Wt 125.8 lb

## 2018-11-19 DIAGNOSIS — R1084 Generalized abdominal pain: Secondary | ICD-10-CM

## 2018-11-19 LAB — CBC WITH DIFFERENTIAL/PLATELET
Basophils Absolute: 0 K/uL (ref 0.0–0.1)
Basophils Relative: 0.6 % (ref 0.0–3.0)
Eosinophils Absolute: 0.1 K/uL (ref 0.0–0.7)
Eosinophils Relative: 1.2 % (ref 0.0–5.0)
HCT: 41.7 % (ref 36.0–46.0)
Hemoglobin: 14.2 g/dL (ref 12.0–15.0)
Lymphocytes Relative: 26.5 % (ref 12.0–46.0)
Lymphs Abs: 1.8 K/uL (ref 0.7–4.0)
MCHC: 34.1 g/dL (ref 30.0–36.0)
MCV: 93.2 fl (ref 78.0–100.0)
Monocytes Absolute: 0.5 K/uL (ref 0.1–1.0)
Monocytes Relative: 8 % (ref 3.0–12.0)
Neutro Abs: 4.4 K/uL (ref 1.4–7.7)
Neutrophils Relative %: 63.7 % (ref 43.0–77.0)
Platelets: 226 K/uL (ref 150.0–400.0)
RBC: 4.47 Mil/uL (ref 3.87–5.11)
RDW: 12.8 % (ref 11.5–15.5)
WBC: 6.8 K/uL (ref 4.0–10.5)

## 2018-11-19 LAB — COMPREHENSIVE METABOLIC PANEL WITH GFR
ALT: 13 U/L (ref 0–35)
AST: 15 U/L (ref 0–37)
Albumin: 4.5 g/dL (ref 3.5–5.2)
Alkaline Phosphatase: 58 U/L (ref 39–117)
BUN: 17 mg/dL (ref 6–23)
CO2: 27 meq/L (ref 19–32)
Calcium: 10.2 mg/dL (ref 8.4–10.5)
Chloride: 107 meq/L (ref 96–112)
Creatinine, Ser: 0.79 mg/dL (ref 0.40–1.20)
GFR: 72.74 mL/min
Glucose, Bld: 81 mg/dL (ref 70–99)
Potassium: 4.3 meq/L (ref 3.5–5.1)
Sodium: 141 meq/L (ref 135–145)
Total Bilirubin: 0.9 mg/dL (ref 0.2–1.2)
Total Protein: 6.8 g/dL (ref 6.0–8.3)

## 2018-11-19 LAB — URINALYSIS, ROUTINE W REFLEX MICROSCOPIC
Bilirubin Urine: NEGATIVE
Hgb urine dipstick: NEGATIVE
Ketones, ur: NEGATIVE
Nitrite: NEGATIVE
RBC / HPF: NONE SEEN
Specific Gravity, Urine: 1.01 (ref 1.000–1.030)
Total Protein, Urine: NEGATIVE
Urine Glucose: NEGATIVE
Urobilinogen, UA: 0.2 (ref 0.0–1.0)
pH: 6 (ref 5.0–8.0)

## 2018-11-19 MED ORDER — IPRATROPIUM BROMIDE 0.06 % NA SOLN: NASAL | 8 refills | Status: DC

## 2018-11-19 NOTE — Progress Notes (Signed)
Subjective:    Patient ID: Rachel Knight, female    DOB: 1951/01/14, 68 y.o.   MRN: [id number]  HPI The patient is here for an acute visit.   She has intermittent GERD - once she changed her diet her symptoms improved.  She will have occ GERD, but it is much improved.  She will take tagamet prn for a few days.  Overall she eats very healthy.  During the COVID-19 pandemic she did start eating more greasy and fatty foods.    5 weeks ago she had severe abdominal pain - the pain was diffuse, but felt like it was focused across the lower abdomen.  She also had right lower- mid back pain.  She had some mild associated GERD.  She denied fever, changes in bowel, urinary symptoms, nausea and vomiting.  It lasted about 7 hrs.  The next day she was fine.  She watched her diet and was ok.   Two nights ago she had another attack - she thinks food triggered it, just like the first one - greasy foods, snacky foods, - she is still not right. It was less severe than the fist episode.  She still has a tightness/bloating across her lower abdomen, intermittent sharp pains. No pain with palpation.     She has changed her diet, but was concerned about her GB.  When she had appendicitis it was an atypical presentation.    Medications and allergies reviewed with patient and updated if appropriate.  Patient Active Problem List   Diagnosis Date Noted  . Arthralgia of left temporomandibular joint 12/05/2017  . Elevated troponin 02/17/2016  . Internal hemorrhoids 02/17/2016  . Anxiety and depression 01/28/2016  . 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 Right hip pain 12/10/2012  . Hypothyroidism 11/30/2009  . Vitamin D deficiency 11/30/2009  . Allergic rhinitis 11/30/2009  . Osteopenia 11/30/2009    Current Outpatient Medications on File Prior to Visit  Medication Sig Dispense Refill  . Cholecalciferol (VITAMIN D3) 2000 units TABS Take 2 tablets by mouth daily.     . citalopram (CELEXA) 10 MG tablet Take 1 tablet (10  mg total) by mouth daily. 90 tablet 2  . Estradiol 10 MCG TABS vaginal tablet Place one tablet (10 mcg) in the vagina twice weekly. 24 tablet 3  . ipratropium (ATROVENT) 0.06 % nasal spray INSTILL 2 SPRAYS INTO EACH NOSTRIL THREE TIMES A DAY 15 mL 1  . levothyroxine (SYNTHROID, LEVOTHROID) 75 MCG tablet Take 1 tablet (75 mcg total) by mouth daily. 90 tablet 3  . Multiple Vitamins-Minerals (OSTEOPRIME ULTRA) TABS     . NON FORMULARY Magnesium Malate    . Probiotic Product (PROBIOTIC DAILY PO) Take by mouth.    . valACYclovir (VALTREX) 1000 MG tablet Take 2 tablets (2000 mg) by mouth twice a day for 1 day. 30 tablet 0   No current facility-administered medications on file prior to visit.     Past Medical History:  Diagnosis Date  . Anxiety   . Depression   . GERD (gastroesophageal reflux disease)    occasional   . HSV-1 infection   . Hypothyroidism   . Osteopenia   . Perennial allergic rhinitis   . 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  Marland Kitchen Vitamin D deficiency     Past Surgical History:  Procedure Laterality Date  . APPENDECTOMY  2011  . AUGMENTATION MAMMAPLASTY    . BREAST ENHANCEMENT SURGERY  1996  . COLONOSCOPY  2000 ,  2007   Dr Olevia Perches. Due 2017  . NECK LIFT  11/2015  . WISDOM TOOTH EXTRACTION      Social History   Socioeconomic History  . Marital status: Married    Spouse name: Not on file  . Number of children: Not on file  . Years of education: Not on file  . Highest education level: Not on file  Occupational History  . Not on file  Social Needs  . Financial resource strain: Not on file  . Food insecurity    Worry: Not on file    Inability: Not on file  . Transportation needs    Medical: Not on file    Non-medical: Not on file  Tobacco Use  . Smoking status: Former Smoker    Types: Cigarettes  . Smokeless tobacco: Never Used  . Tobacco comment: only smoked age [age] , 1 ppweek  Substance and Sexual Activity  . Alcohol use: Yes     Alcohol/week: 7.0 standard drinks    Types: 7 Shots of liquor per week    Comment: 1 drinks/day  . Drug use: No  . Sexual activity: Yes    Partners: Male    Birth control/protection: Post-menopausal  Lifestyle  . Physical activity    Days per week: Not on file    Minutes per session: Not on file  . Stress: Not on file  Relationships  . Social Herbalist on phone: Not on file    Gets together: Not on file    Attends religious service: Not on file    Active member of club or organization: Not on file    Attends meetings of clubs or organizations: Not on file    Relationship status: Not on file  Other Topics Concern  . Not on file  Social History Narrative   Exercise: weights, walking, pilates, swimming    Family History  Problem Relation Age of Onset  . Stroke Father 72  . Colon polyps Father   . Other Brother 45       OS muscle paralysis , resolution over 3 days. Neg evaluation  . Breast cancer Mother 58  . Thyroid disease Mother   . Colon cancer Paternal Grandfather   . Coronary artery disease Maternal Grandmother 63  . Colon cancer Paternal Grandmother   . Coronary artery disease Maternal Grandfather 60  . Diabetes Neg Hx   . Esophageal cancer Neg Hx   . Rectal cancer Neg Hx   . Stomach cancer Neg Hx     Review of Systems  Constitutional: Negative for chills and fever.  Gastrointestinal: Positive for abdominal pain and constipation (occ, resolves with increased water ). Negative for blood in stool, diarrhea, nausea and vomiting.  Genitourinary: Negative for dysuria, frequency and hematuria.       Objective:   Vitals:   11/19/18 1259  BP: 104/70  Pulse: 68  Resp: 16  Temp: 97.9 F (36.6 C)  SpO2: 98%   BP Readings from Last 3 Encounters:  11/19/18 104/70  04/30/18 100/62  12/14/17 122/66   Wt Readings from Last 3 Encounters:  11/19/18 125 lb 12.8 oz (57.1 kg)  04/30/18 125 lb (56.7 kg)  12/14/17 127 lb (57.6 kg)   Body mass index is  20.3 kg/m.   Physical Exam Constitutional:      Appearance: Normal appearance.  HENT:     Head: Normocephalic and atraumatic.  Abdominal:     General: Abdomen is flat. There is no distension.  Palpations: Abdomen is soft.     Tenderness: There is no abdominal tenderness. There is no left CVA tenderness, guarding or rebound.  Skin:    General: Skin is warm and dry.  Neurological:     Mental Status: She is alert.            Assessment & Plan:    See Problem List for Assessment and Plan of chronic medical problems.

## 2018-11-19 NOTE — Patient Instructions (Signed)
  Tests ordered today. Your results will be released to [place] (or called to you) after review.  If any changes need to be made, you will be notified at that same time.  An ultrasound was ordered.  They will call you to schedule this.   Medications reviewed and updated.  Changes include :  none

## 2018-11-19 NOTE — Telephone Encounter (Signed)
Pt preferred to go ahead and have an appointment. Appointment made for today.

## 2018-11-19 NOTE — Assessment & Plan Note (Signed)
Two episodes of intermittent abdominal pain - first was severe, second she still has some residual symptoms Cbc, cmp, UA Abd [place] ordered to evaluate GB Depending on above will refer to GI, consider CT or refer to surgery

## 2018-11-20 ENCOUNTER — Encounter: Payer: Self-pay | Admitting: Internal Medicine

## 2018-11-21 ENCOUNTER — Ambulatory Visit (HOSPITAL_COMMUNITY): Payer: Medicare Other

## 2018-11-22 ENCOUNTER — Other Ambulatory Visit: Payer: Self-pay

## 2018-11-22 ENCOUNTER — Ambulatory Visit (HOSPITAL_COMMUNITY)
Admission: RE | Admit: 2018-11-22 | Discharge: 2018-11-22 | Disposition: A | Discharge status: Home / Self Care | Payer: Medicare Other | Source: Ambulatory Visit | Attending: Internal Medicine | Admitting: Internal Medicine

## 2018-11-22 DIAGNOSIS — R1084 Generalized abdominal pain: Secondary | ICD-10-CM | POA: Diagnosis not present

## 2018-11-22 DIAGNOSIS — K7689 Other specified diseases of liver: Secondary | ICD-10-CM | POA: Diagnosis not present

## 2018-11-24 ENCOUNTER — Encounter: Payer: Self-pay | Admitting: Internal Medicine

## 2018-11-25 ENCOUNTER — Encounter: Payer: Self-pay | Admitting: Internal Medicine

## 2018-11-25 MED ORDER — OMEPRAZOLE 20 MG PO CPDR: 20.0000 mg | DELAYED_RELEASE_CAPSULE | Freq: Every day | ORAL | 0 refills | Status: DC

## 2018-11-26 ENCOUNTER — Other Ambulatory Visit: Payer: Self-pay | Admitting: Internal Medicine

## 2018-12-09 ENCOUNTER — Encounter: Payer: Self-pay | Admitting: Internal Medicine

## 2018-12-10 NOTE — Progress Notes (Addendum)
Subjective:   Rachel Knight is a 68 y.o. female who presents for an Initial Medicare Annual Wellness Visit.  I connected with patient 12/10/18 at  8:00 AM EDT by a video/audio enabled telemedicine application and verified that I am speaking with the correct person using two identifiers. Patient stated full name and DOB. Patient gave permission to continue with virtual visit. Patient's location was at home and Nurse's location was at [place] office.   Review of Systems       Sleep patterns: feels rested on waking, gets up 0-1 times nightly to void and sleeps 7-8 hours nightly.    Home Safety/Smoke Alarms: Feels safe in home. Smoke alarms in place.  Living environment; residence and Firearm Safety: 2-story house. Lives with husband, no needs for DME, good support system Seat Belt Safety/Bike Helmet: Wears seat belt.      Objective:    There were no vitals filed for this visit. There is no height or weight on file to calculate BMI.  Advanced Directives 01/11/2016 12/29/2015 10/14/2013 07/22/2013  Does Patient Have a Medical Advance Directive? Yes No Patient does not have advance directive Patient does not have advance directive    Current Medications (verified) Outpatient Encounter Medications as of 12/11/2018  Medication Sig  . Cholecalciferol (VITAMIN D3) 2000 units TABS Take 2 tablets by mouth daily.   . citalopram (CELEXA) 10 MG tablet Take 1 tablet (10 mg total) by mouth daily.  . Estradiol 10 MCG TABS vaginal tablet Place one tablet (10 mcg) in the vagina twice weekly.  Marland Kitchen ipratropium (ATROVENT) 0.06 % nasal spray INSTILL 2 SPRAYS INTO EACH NOSTRIL THREE TIMES A DAY  . levothyroxine (SYNTHROID, LEVOTHROID) 75 MCG tablet Take 1 tablet (75 mcg total) by mouth daily.  . Multiple Vitamins-Minerals (OSTEOPRIME ULTRA) TABS   . NON FORMULARY Magnesium Malate  . omeprazole (PRILOSEC) 20 MG capsule Take 1 capsule (20 mg total) by mouth daily.  . Probiotic Product (PROBIOTIC DAILY PO)  Take by mouth.  . valACYclovir (VALTREX) 1000 MG tablet Take 2 tablets (2000 mg) by mouth twice a day for 1 day.   No facility-administered encounter medications on file as of 12/11/2018.     Allergies (verified) Other   History: Past Medical History:  Diagnosis Date  . Anxiety   . Depression   . GERD (gastroesophageal reflux disease)    occasional   . HSV-1 infection   . Hypothyroidism   . Osteopenia   . Perennial allergic rhinitis   . 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  Marland Kitchen Vitamin D deficiency    Past Surgical History:  Procedure Laterality Date  . APPENDECTOMY  2011  . AUGMENTATION MAMMAPLASTY    . BREAST ENHANCEMENT SURGERY  1996  . COLONOSCOPY  2000 , 2007   Dr Olevia Perches. Due 2017  . NECK LIFT  11/2015  . WISDOM TOOTH EXTRACTION     Family History  Problem Relation Age of Onset  . Stroke Father 60  . Colon polyps Father   . Other Brother 45       OS muscle paralysis , resolution over 3 days. Neg evaluation  . Breast cancer Mother 3  . Thyroid disease Mother   . Colon cancer Paternal Grandfather   . Coronary artery disease Maternal Grandmother 63  . Colon cancer Paternal Grandmother   . Coronary artery disease Maternal Grandfather 60  . Diabetes Neg Hx   . Esophageal cancer Neg Hx   . Rectal cancer  Neg Hx   . Stomach cancer Neg Hx    Social History   Socioeconomic History  . Marital status: Married    Spouse name: Not on file  . Number of children: Not on file  . Years of education: Not on file  . Highest education level: Not on file  Occupational History  . Not on file  Social Needs  . Financial resource strain: Not on file  . Food insecurity    Worry: Not on file    Inability: Not on file  . Transportation needs    Medical: Not on file    Non-medical: Not on file  Tobacco Use  . Smoking status: Former Smoker    Types: Cigarettes  . Smokeless tobacco: Never Used  . Tobacco comment: only smoked age [age] , 1 ppweek   Substance and Sexual Activity  . Alcohol use: Yes    Alcohol/week: 7.0 standard drinks    Types: 7 Shots of liquor per week    Comment: 1 drinks/day  . Drug use: No  . Sexual activity: Yes    Partners: Male    Birth control/protection: Post-menopausal  Lifestyle  . Physical activity    Days per week: Not on file    Minutes per session: Not on file  . Stress: Not on file  Relationships  . Social Herbalist on phone: Not on file    Gets together: Not on file    Attends religious service: Not on file    Active member of club or organization: Not on file    Attends meetings of clubs or organizations: Not on file    Relationship status: Not on file  Other Topics Concern  . Not on file  Social History Narrative   Exercise: weights, walking, pilates, swimming    Tobacco Counseling Counseling given: Not Answered Comment: only smoked age [age] , 1 ppweek  Activities of Daily Living No flowsheet data found.   Immunizations and Health Maintenance Immunization History  Administered Date(s) Administered  . DTaP 10/29/2008  . HPV Quadrivalent 03/06/1998  . Influenza Split 01/26/2014  . Influenza, High Dose Seasonal PF 12/05/2017  . Influenza,inj,Quad PF,6+ Mos 03/12/2017  . Influenza-Unspecified 01/18/2016, 02/03/2017  . Pneumococcal Conjugate-13 12/12/2017  . Tdap 03/06/2008, 04/30/2018  . Zoster 10/09/2013  . Zoster Recombinat (Shingrix) 07/05/2017, 01/14/2018   There are no preventive care reminders to display for this patient.  Patient Care Team: Binnie Rail, MD as PCP - General (Internal Medicine)  Indicate any recent Medical Services you may have received from other than [facility] providers in the past year (date may be approximate).     Assessment:   This is a routine wellness examination for Rachel Knight. Physical assessment deferred to PCP.  Hearing/Vision screen No exam data present  Dietary issues and exercise activities discussed:   Diet (meal  preparation, eat out, water intake, caffeinated beverages, dairy products, fruits and vegetables): in general, a "healthy" diet  , well balanced eats a variety of fruits and vegetables daily, limits salt, fat/cholesterol, sugar,carbohydrates,caffeine, drinks 6-8 glasses of water daily.   Goals   None    Depression Screen PHQ 2/9 Scores 12/05/2017  PHQ - 2 Score 0  PHQ- 9 Score 0    Fall Risk Fall Risk  12/05/2017  Falls in the past year? No    Cognitive Function:       Ad8 score reviewed for issues:  Issues making decisions: no  Less interest in hobbies / activities:  no  Repeats questions, stories (family complaining): no  Trouble using ordinary gadgets (microwave, computer, phone):no  Forgets the month or year: no  Mismanaging finances: no  Remembering appts: no  Daily problems with thinking and/or memory: no Ad8 score is= 0  Screening Tests Health Maintenance  Topic Date Due  . INFLUENZA VACCINE  06/04/2019 (Originally 10/05/2018)  . PNA vac Low Risk Adult (2 of 2 - PPSV23) 12/13/2018  . MAMMOGRAM  03/24/2019  . DEXA SCAN  03/23/2020  . COLONOSCOPY  01/10/2021  . TETANUS/TDAP  04/30/2028  . Hepatitis C Screening  Completed     Plan:    Reviewed health maintenance screenings with patient today and relevant education, vaccines, and/or referrals were provided.   Continue to eat heart healthy diet (full of fruits, vegetables, whole grains, lean protein, water--limit salt, fat, and sugar intake) and increase physical activity as tolerated.  Continue doing brain stimulating activities (puzzles, reading, adult coloring books, staying active) to keep memory sharp.   I have personally reviewed and noted the following in the patient's chart:   . Medical and social history . Use of alcohol, tobacco or illicit drugs  . Current medications and supplements . Functional ability and status . Nutritional status . Physical activity . Advanced directives . List of  other physicians . Screenings to include cognitive, depression, and falls . Referrals and appointments  In addition, I have reviewed and discussed with patient certain preventive protocols, quality metrics, and best practice recommendations. A written personalized care plan for preventive services as well as general preventive health recommendations were provided to patient.     Michiel Cowboy, RN   12/10/2018    Medical screening examination/treatment/procedure(s) were performed by non-physician practitioner and as supervising physician I was immediately available for consultation/collaboration. I agree with above. Binnie Rail, MD

## 2018-12-10 NOTE — Progress Notes (Signed)
Virtual Visit via Video Note  I connected with Rachel Knight on 12/11/18 at  9:00 AM EDT by a video enabled telemedicine application and verified that I am speaking with the correct person using two identifiers.   I discussed the limitations of evaluation and management by telemedicine and the availability of in person appointments. The patient expressed understanding and agreed to proceed.  The patient is currently at home and I am in the office.    No referring provider.    History of Present Illness: This visit is for an acute visit for cold symptoms.   Her symptoms started about 4 days ago.  She had been around her granddaughter and she did have a runny nose, but no other symptoms.  She denies other sick contacts.  She is experiencing fever around 100, nasal congestion, sinus pressure, slightly scratchy throat, minimal cough, body aches and headaches.  She does feel slightly better today-her congestion and sinus pressure seem to be slightly better.  She has tried taking  Dayquil, nyquil, advil  She is wondering about getting tested for COVID.  She thinks her risk is low, but does not want to take any chances.  Review of Systems  Constitutional: Positive for fever.  HENT: Positive for congestion and sinus pain. Negative for ear pain and sore throat (slight scratchy throat).        No change in taste  Respiratory: Positive for cough (minimal). Negative for shortness of breath and wheezing.   Gastrointestinal: Negative for diarrhea and nausea.  Musculoskeletal: Positive for myalgias.  Neurological: Positive for headaches.     Social History   Socioeconomic History  . Marital status: Married    Spouse name: Not on file  . Number of children: 2  . Years of education: Not on file  . Highest education level: Not on file  Occupational History  . Occupation: retired  Scientific laboratory technician  . Financial resource strain: Not hard at all  . Food insecurity    Worry: Never true   Inability: Never true  . Transportation needs    Medical: No    Non-medical: No  Tobacco Use  . Smoking status: Former Smoker    Types: Cigarettes  . Smokeless tobacco: Never Used  . Tobacco comment: only smoked age [age] , 1 ppweek  Substance and Sexual Activity  . Alcohol use: Yes    Alcohol/week: 7.0 standard drinks    Types: 7 Shots of liquor per week    Comment: 1 drinks/day  . Drug use: No  . Sexual activity: Yes    Partners: Male    Birth control/protection: Post-menopausal  Lifestyle  . Physical activity    Days per week: 5 days    Minutes per session: 60 min  . Stress: Not at all  Relationships  . Social connections    Talks on phone: More than three times a week    Gets together: More than three times a week    Attends religious service: Not on file    Active member of club or organization: Yes    Attends meetings of clubs or organizations: More than 4 times per year    Relationship status: Married  Other Topics Concern  . Not on file  Social History Narrative   Exercise: weights, walking, pilates, swimming     Observations/Objective: Appears well in NAD Breathing normally  Assessment and Plan:  See Problem List for Assessment and Plan of chronic medical problems.   Follow Up Instructions:  I discussed the assessment and treatment plan with the patient. The patient was provided an opportunity to ask questions and all were answered. The patient agreed with the plan and demonstrated an understanding of the instructions.   The patient was advised to call back or seek an in-person evaluation if the symptoms worsen or if the condition fails to improve as anticipated.    Binnie Rail, MD

## 2018-12-11 ENCOUNTER — Ambulatory Visit (INDEPENDENT_AMBULATORY_CARE_PROVIDER_SITE_OTHER): Payer: Medicare Other | Admitting: *Deleted

## 2018-12-11 ENCOUNTER — Encounter: Payer: Self-pay | Admitting: Internal Medicine

## 2018-12-11 ENCOUNTER — Other Ambulatory Visit: Payer: Self-pay

## 2018-12-11 ENCOUNTER — Ambulatory Visit (INDEPENDENT_AMBULATORY_CARE_PROVIDER_SITE_OTHER): Payer: Medicare Other | Admitting: Internal Medicine

## 2018-12-11 DIAGNOSIS — Z20828 Contact with and (suspected) exposure to other viral communicable diseases: Secondary | ICD-10-CM | POA: Diagnosis not present

## 2018-12-11 DIAGNOSIS — J019 Acute sinusitis, unspecified: Secondary | ICD-10-CM | POA: Diagnosis not present

## 2018-12-11 DIAGNOSIS — Z0000 Encounter for general adult medical examination without abnormal findings: Secondary | ICD-10-CM | POA: Diagnosis not present

## 2018-12-11 DIAGNOSIS — Z20822 Contact with and (suspected) exposure to covid-19: Secondary | ICD-10-CM

## 2018-12-11 DIAGNOSIS — J329 Chronic sinusitis, unspecified: Secondary | ICD-10-CM | POA: Insufficient documentation

## 2018-12-11 DIAGNOSIS — R509 Fever, unspecified: Secondary | ICD-10-CM | POA: Diagnosis not present

## 2018-12-11 NOTE — Assessment & Plan Note (Signed)
She likely has a sinus infection and most likely it is viral in nature She has seen some improvement since yesterday, but is still symptomatic and has a temperature around 100 We will get her tested for COVID-she will go today Continue symptomatic treatment If her symptoms are not improving we may need to consider an antibiotic She agrees with treatment plan and will update me on her symptoms

## 2018-12-13 ENCOUNTER — Telehealth: Payer: Self-pay | Admitting: Internal Medicine

## 2018-12-13 ENCOUNTER — Encounter: Payer: Self-pay | Admitting: Internal Medicine

## 2018-12-13 LAB — NOVEL CORONAVIRUS, NAA: SARS-CoV-2, NAA: NOT DETECTED

## 2018-12-13 MED ORDER — AMOXICILLIN-POT CLAVULANATE 875-125 MG PO TABS: 1.0000 | ORAL_TABLET | Freq: Two times a day (BID) | ORAL | 0 refills | Status: DC

## 2018-12-13 NOTE — Telephone Encounter (Signed)
Pt aware covid lab test negative, not detected °

## 2018-12-24 NOTE — Progress Notes (Signed)
Subjective:    Patient ID: Rachel Knight, female    DOB: [birth year], 68 y.o.   MRN: [id number]  HPI The patient is here for follow up.  Sinus infection:  She completed augmentin.  Her symptoms have almost resolved.  She has some residual symptoms which may be allergies.    GERD: The antibiotic did cause stomach upset, gerd and she is taking the omeprazole and probiotics.   Hypothyroidism:  She is taking her medication daily.  She denies any recent changes in energy or weight that are unexplained.   Osteopenia:  She is exercising regularly - daily. Her dexa is up to date.  She takes vitamin d daily.   Anxiety, depression: She weaned herself off the celexa.  She denies any depression or anxiety at this time. She feels good and will let me know in the future if she feels like she needs the medication again.   Medications and allergies reviewed with patient and updated if appropriate.  Patient Active Problem List   Diagnosis Date Noted  . Sinus infection 12/11/2018  . Generalized abdominal pain 11/19/2018  . Arthralgia of left temporomandibular joint 12/05/2017  . Elevated troponin 02/17/2016  . Internal hemorrhoids 02/17/2016  . 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 Hypothyroidism 11/30/2009  . Vitamin D deficiency 11/30/2009  . Allergic rhinitis 11/30/2009  . Osteopenia 11/30/2009    Current Outpatient Medications on File Prior to Visit  Medication Sig Dispense Refill  . Cholecalciferol (VITAMIN D3) 2000 units TABS Take 4,000 Units by mouth daily.     . Estradiol 10 MCG TABS vaginal tablet Place one tablet (10 mcg) in the vagina twice weekly. 24 tablet 3  . ipratropium (ATROVENT) 0.06 % nasal spray INSTILL 2 SPRAYS INTO EACH NOSTRIL THREE TIMES A DAY 15 mL 8  . levothyroxine (SYNTHROID, LEVOTHROID) 75 MCG tablet Take 1 tablet (75 mcg total) by mouth daily. 90 tablet 3  . Multiple Vitamins-Minerals (OSTEOPRIME ULTRA) TABS     . NON FORMULARY Magnesium Malate    . Omega-3 Fatty Acids  (FISH OIL PO) Take 1 tablet by mouth daily.    Marland Kitchen omeprazole (PRILOSEC) 10 MG capsule Take 10 mg by mouth daily.    . Probiotic Product (PROBIOTIC DAILY PO) Take by mouth.    . valACYclovir (VALTREX) 1000 MG tablet Take 2 tablets (2000 mg) by mouth twice a day for 1 day. (Patient taking differently: as needed. Take 2 tablets (2000 mg) by mouth twice a day for 1 day.) 30 tablet 0   No current facility-administered medications on file prior to visit.     Past Medical History:  Diagnosis Date  . Anxiety   . Depression   . GERD (gastroesophageal reflux disease)    occasional   . HSV-1 infection   . Hypothyroidism   . Osteopenia   . Perennial allergic rhinitis   . 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  Marland Kitchen Vitamin D deficiency     Past Surgical History:  Procedure Laterality Date  . APPENDECTOMY  2011  . AUGMENTATION MAMMAPLASTY    . BREAST ENHANCEMENT SURGERY  1996  . COLONOSCOPY  2000 , 2007   Dr Olevia Perches. Due 2017  . NECK LIFT  11/2015  . WISDOM TOOTH EXTRACTION      Social History   Socioeconomic History  . Marital status: Married    Spouse name: Not on file  . Number of children: 2  . Years of education: Not on file  .  Highest education level: Not on file  Occupational History  . Occupation: retired  Scientific laboratory technician  . Financial resource strain: Not hard at all  . Food insecurity    Worry: Never true    Inability: Never true  . Transportation needs    Medical: No    Non-medical: No  Tobacco Use  . Smoking status: Former Smoker    Types: Cigarettes  . Smokeless tobacco: Never Used  . Tobacco comment: only smoked age [age] , 1 ppweek  Substance and Sexual Activity  . Alcohol use: Yes    Alcohol/week: 7.0 standard drinks    Types: 7 Shots of liquor per week    Comment: 1 drinks/day  . Drug use: No  . Sexual activity: Yes    Partners: Male    Birth control/protection: Post-menopausal  Lifestyle  . Physical activity    Days per week: 5 days     Minutes per session: 60 min  . Stress: Not at all  Relationships  . Social connections    Talks on phone: More than three times a week    Gets together: More than three times a week    Attends religious service: Not on file    Active member of club or organization: Yes    Attends meetings of clubs or organizations: More than 4 times per year    Relationship status: Married  Other Topics Concern  . Not on file  Social History Narrative   Exercise: weights, walking, pilates, swimming    Family History  Problem Relation Age of Onset  . Stroke Father 24  . Colon polyps Father   . Other Brother 45       OS muscle paralysis , resolution over 3 days. Neg evaluation  . Breast cancer Mother 18  . Thyroid disease Mother   . Colon cancer Paternal Grandfather   . Coronary artery disease Maternal Grandmother 63  . Colon cancer Paternal Grandmother   . Coronary artery disease Maternal Grandfather 60  . Diabetes Neg Hx   . Esophageal cancer Neg Hx   . Rectal cancer Neg Hx   . Stomach cancer Neg Hx     Review of Systems  Constitutional: Negative for chills, fatigue, fever and unexpected weight change.  Respiratory: Positive for cough (some mucus - ? residual from URI or GERD). Negative for shortness of breath and wheezing.   Cardiovascular: Negative for chest pain, palpitations and leg swelling.  Gastrointestinal: Negative for abdominal pain, constipation and diarrhea.  Musculoskeletal: Positive for arthralgias (mild arthritis - improved with exercise).  Neurological: Negative for light-headedness and headaches.  Psychiatric/Behavioral: Negative for dysphoric mood. The patient is not nervous/anxious.        Objective:   Vitals:   12/25/18 1108  BP: 106/68  Pulse: 76  Resp: 16  Temp: 98.4 F (36.9 C)  SpO2: 99%   BP Readings from Last 3 Encounters:  12/25/18 106/68  11/19/18 104/70  04/30/18 100/62   Wt Readings from Last 3 Encounters:  12/25/18 123 lb (55.8 kg)   11/19/18 125 lb 12.8 oz (57.1 kg)  04/30/18 125 lb (56.7 kg)   Body mass index is 19.85 kg/m.   Physical Exam    Constitutional: Appears well-developed and well-nourished. No distress.  HENT:  Head: Normocephalic and atraumatic.  Neck: Neck supple. No tracheal deviation present. No thyromegaly present.  No cervical lymphadenopathy Cardiovascular: Normal rate, regular rhythm and normal heart sounds.   No murmur heard. No carotid bruit .  No  edema Pulmonary/Chest: Effort normal and breath sounds normal. No respiratory distress. No has no wheezes. No rales. Abdomen: Soft, nontender, nondistended Skin: Skin is warm and dry. Not diaphoretic.  Psychiatric: Normal mood and affect. Behavior is normal.      Assessment & Plan:    See Problem List for Assessment and Plan of chronic medical problems.

## 2018-12-24 NOTE — Patient Instructions (Addendum)
  Tests ordered today. Your results will be released to [place] (or called to you) after review.  If any changes need to be made, you will be notified at that same time.  All other Health Maintenance issues reviewed.   All recommended immunizations and age-appropriate screenings are up-to-date or discussed.  Pneumonia immunization administered today.   Medications reviewed and updated.  Changes include :   none    Please followup in 1 year

## 2018-12-25 ENCOUNTER — Ambulatory Visit (INDEPENDENT_AMBULATORY_CARE_PROVIDER_SITE_OTHER): Payer: Medicare Other | Admitting: Internal Medicine

## 2018-12-25 ENCOUNTER — Other Ambulatory Visit: Payer: Self-pay

## 2018-12-25 ENCOUNTER — Other Ambulatory Visit (INDEPENDENT_AMBULATORY_CARE_PROVIDER_SITE_OTHER): Payer: Medicare Other

## 2018-12-25 ENCOUNTER — Encounter: Payer: Self-pay | Admitting: Internal Medicine

## 2018-12-25 VITALS — BP 106/68 | HR 76 | Temp 98.4°F | Resp 16 | Ht 66.0 in | Wt 123.0 lb

## 2018-12-25 DIAGNOSIS — M85851 Other specified disorders of bone density and structure, right thigh: Secondary | ICD-10-CM

## 2018-12-25 DIAGNOSIS — Z23 Encounter for immunization: Secondary | ICD-10-CM

## 2018-12-25 DIAGNOSIS — K219 Gastro-esophageal reflux disease without esophagitis: Secondary | ICD-10-CM | POA: Diagnosis not present

## 2018-12-25 DIAGNOSIS — E038 Other specified hypothyroidism: Secondary | ICD-10-CM | POA: Diagnosis not present

## 2018-12-25 DIAGNOSIS — E559 Vitamin D deficiency, unspecified: Secondary | ICD-10-CM | POA: Diagnosis not present

## 2018-12-25 DIAGNOSIS — M85852 Other specified disorders of bone density and structure, left thigh: Secondary | ICD-10-CM | POA: Diagnosis not present

## 2018-12-25 LAB — TSH: TSH: 0.59 u[IU]/mL (ref 0.35–4.50)

## 2018-12-25 LAB — VITAMIN D 25 HYDROXY (VIT D DEFICIENCY, FRACTURES): VITD: 50.38 ng/mL (ref 30.00–100.00)

## 2018-12-25 NOTE — Assessment & Plan Note (Signed)
DEXA up-to-date Exercising regularly Taking vitamin D daily We will check vitamin D level

## 2018-12-25 NOTE — Assessment & Plan Note (Signed)
Taking vitamin D daily Check level 

## 2018-12-25 NOTE — Assessment & Plan Note (Signed)
Clinically euthyroid Check tsh  Titrate med dose if needed  

## 2018-12-25 NOTE — Assessment & Plan Note (Signed)
Intermittent GERD Takes omeprazole as needed Takes probiotics GERD often related to certain foods or most recently antibiotic

## 2018-12-26 ENCOUNTER — Encounter: Payer: Self-pay | Admitting: Internal Medicine

## 2019-01-06 ENCOUNTER — Ambulatory Visit: Payer: Medicare Other

## 2019-01-15 ENCOUNTER — Encounter: Payer: Self-pay | Admitting: Internal Medicine

## 2019-01-15 DIAGNOSIS — K219 Gastro-esophageal reflux disease without esophagitis: Secondary | ICD-10-CM

## 2019-01-16 MED ORDER — OMEPRAZOLE 40 MG PO CPDR: 40.0000 mg | DELAYED_RELEASE_CAPSULE | Freq: Every day | ORAL | 3 refills | Status: DC

## 2019-01-16 NOTE — Addendum Note (Signed)
Addended by: Binnie Rail on: 01/16/2019 10:18 AM   Modules accepted: Orders

## 2019-01-20 ENCOUNTER — Telehealth: Payer: Self-pay

## 2019-01-20 ENCOUNTER — Ambulatory Visit (INDEPENDENT_AMBULATORY_CARE_PROVIDER_SITE_OTHER): Payer: Medicare Other

## 2019-01-20 ENCOUNTER — Other Ambulatory Visit: Payer: Self-pay

## 2019-01-20 DIAGNOSIS — Z23 Encounter for immunization: Secondary | ICD-10-CM

## 2019-01-20 NOTE — Telephone Encounter (Signed)
Patient came in for flu shot today (nurse visit)--she recently recd pneumo 23 vaccine and had reaction to vaccine, see message to dr burns---we are not quite at 4 week mark since receiving pneumo 23 vaccine, I have advised patient that i'm not sure if this would exacerbate another reaction with getting flu shot today, dr burns has advised that getting flu shot today would be up to the patient, being aware that sensitivity to flu shot could be possible---patient decided to go ahead with flu shot today and will call back if experiences any problems

## 2019-01-26 NOTE — Progress Notes (Addendum)
01/26/2019 Rachel Knight [id number] [birth year]   History of Present Illness: Rachel Knight is a 68 year old female with a past medical history of anxiety, depression, hypothyroidism and GERD. She complains of having frequent throat clearing. She describes secretions bubble up in throat. Occasional heartburn. No dysphagia or stomach pain. She took Omeprazole 20mg  daily as prescribed by her PCP without improvement.Omeprazole was increased to 40mg  QD which resulted in headaches so she stopped it. She is now taking Tagamet 200mg  bid for the past week with some improvement. She has post nasal drainage. No current sinus pressure. She uses Atrovent nasal spray.She drank more alcohol since Covid, 4 ounces of Vodka daily since Covid but she has recently decreased to 2 ounces daily or less. She also ate less healthy over the past few months, snacked on potato chips. She never had an EGD. She is passing a normal formed brown bowel movement daily. No rectal bleeding or black stools. She underwent a colonoscopy by Dr. Silverio Decamp 01/2016 which showed 1 tubular adenomatous polyps. She was initially advised to repeat a colonoscopy in 5 years, however, when she was seen in office by Dr. Silverio Decamp  06/01/2018 she was advised to repeat a colonoscopy 01/2017 which was not done. Her paternal grandfather had colon cancer and her father had colon polyps. No other complaints today.    Colonoscopy 01/11/2016 by Dr. Silverio Decamp  - The perianal and digital rectal examinations were normal. - A 4 mm tubular adenomatous polyp was found in the cecum. - Non-bleeding internal hemorrhoids were found during retroflexion. The hemorrhoids were small. - The exam was otherwise without abnormality.   Past Medical History:  Diagnosis Date  . Anxiety   . Depression   . GERD (gastroesophageal reflux disease)    occasional   . HSV-1 infection   . Hypothyroidism   . Osteopenia   . Perennial allergic rhinitis   . 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  Marland Kitchen Vitamin D deficiency    Past Surgical History:  Procedure Laterality Date  . APPENDECTOMY  2011  . AUGMENTATION MAMMAPLASTY    . BREAST ENHANCEMENT SURGERY  1996  . COLONOSCOPY  2000 , 2007   Dr Olevia Perches. Due 2017  . NECK LIFT  11/2015  . WISDOM TOOTH EXTRACTION     Current Outpatient Medications on File Prior to Visit  Medication Sig Dispense Refill  . Cholecalciferol (VITAMIN D3) 2000 units TABS Take 4,000 Units by mouth daily.     . cimetidine (TAGAMET) 200 MG tablet Take 200 mg by mouth 2 (two) times daily.    . Estradiol 10 MCG TABS vaginal tablet Place one tablet (10 mcg) in the vagina twice weekly. (Patient taking differently: once a week. Place one tablet (10 mcg) in the vagina once  weekly.) 24 tablet 3  . ipratropium (ATROVENT) 0.06 % nasal spray INSTILL 2 SPRAYS INTO EACH NOSTRIL THREE TIMES A DAY (Patient taking differently: daily as needed. INSTILL 2 SPRAYS INTO EACH NOSTRIL THREE TIMES A DAY) 15 mL 8  . levothyroxine (SYNTHROID, LEVOTHROID) 75 MCG tablet Take 1 tablet (75 mcg total) by mouth daily. 90 tablet 3  . Multiple Vitamins-Minerals (OSTEOPRIME ULTRA) TABS as directed.     . NON FORMULARY Magnesium Malate    . Omega-3 Fatty Acids (FISH OIL PO) Take 1 tablet by mouth daily.    . Probiotic Product (PROBIOTIC DAILY PO) Take by mouth daily.     . valACYclovir (VALTREX) 1000 MG  tablet Take 2 tablets (2000 mg) by mouth twice a day for 1 day. (Patient taking differently: as needed. Take 2 tablets (2000 mg) by mouth twice a day for 1 day.) 30 tablet 0   No current facility-administered medications on file prior to visit.    Allergies  Allergen Reactions  . Other     Food sensitivities  . Augmentin [Amoxicillin-Pot Clavulanate] Other (See Comments)    GERD, upset stomach   Family History  Problem Relation Age of Onset  . Stroke Father 28  . Colon polyps Father   . Other Brother 45       OS muscle paralysis , resolution  over 3 days. Neg evaluation  . Breast cancer Mother 37  . Thyroid disease Mother   . Colon cancer Paternal Grandfather   . Coronary artery disease Maternal Grandmother 63  . Colon cancer Paternal Grandmother   . Coronary artery disease Maternal Grandfather 60  . Diabetes Neg Hx   . Esophageal cancer Neg Hx   . Rectal cancer Neg Hx   . Stomach cancer Neg Hx      Current Medications, Allergies, Past Medical History, Past Surgical History, Family History and Social History were reviewed in [employer] record.   Physical Exam: BP 104/62   Pulse 80   Temp (!) 96.3 F (35.7 C)   Ht 5' 6.25" (1.683 m)   Wt 126 lb 4 oz (57.3 kg)   LMP 06/17/2011   BMI 20.22 kg/m   LMP 06/17/2011  General: Well developed 68 year old female in no acute distress Head: Normocephalic and atraumatic Eyes:  sclerae anicteric, conjunctiva pink  Ears: Normal auditory acuity Lungs: Clear throughout to auscultation Heart: Regular rate and rhythm Abdomen: Soft, non tender and non distended. No masses, no hepatomegaly. Normal bowel sounds x 4 quads Rectal: Deferred Musculoskeletal: Symmetrical with no gross deformities  Extremities: No edema  Neurological: Alert oriented x 4, no focal deficits Psychological:  Alert and cooperative. Normal mood and affect  Assessment and Recommendations:  41. 68 year old female with laryngeal reflux symptoms +/- post nasal drainage, infrequent heartburn -EGD benefits and risks discussed including risk with sedation, risk of bleeding, perforation and infection -Continue Tagamet 200mg  bid for now as patient started this medication 1 week ago. She had headaches on Omeprazole, however, if her symptoms persist or worsen she will most likely require an alternative PPI bid if tolerated -GERD Diet reviewed  2. History of a tubular adenomatous polyps. Positive family history of colon cancer and colon polyps -Colonoscopy at time of EGD,  benefits and risks  discussed including risk with sedation, risk of bleeding, perforation and infection   3. Mild Anxiety   Further follow up to be determined after the above evaluation completed   ADDENDUM: Patient sent a msg today reporting her reflux symptoms have improved and she wishes to cancel her scheduled EGD. Patient to call office if her  symptoms worsen. Dr. Silverio Decamp verified her next colonoscopy is due 01/2021.

## 2019-01-28 ENCOUNTER — Ambulatory Visit (INDEPENDENT_AMBULATORY_CARE_PROVIDER_SITE_OTHER): Payer: Medicare Other | Admitting: Nurse Practitioner

## 2019-01-28 ENCOUNTER — Encounter: Payer: Self-pay | Admitting: Nurse Practitioner

## 2019-01-28 VITALS — BP 104/62 | HR 80 | Temp 96.3°F | Ht 66.25 in | Wt 126.2 lb

## 2019-01-28 DIAGNOSIS — K219 Gastro-esophageal reflux disease without esophagitis: Secondary | ICD-10-CM

## 2019-01-28 DIAGNOSIS — R0989 Other specified symptoms and signs involving the circulatory and respiratory systems: Secondary | ICD-10-CM

## 2019-01-28 DIAGNOSIS — R6889 Other general symptoms and signs: Secondary | ICD-10-CM

## 2019-01-28 DIAGNOSIS — Z86010 Personal history of colonic polyps: Secondary | ICD-10-CM

## 2019-01-28 DIAGNOSIS — Z1159 Encounter for screening for other viral diseases: Secondary | ICD-10-CM | POA: Diagnosis not present

## 2019-01-28 DIAGNOSIS — R0982 Postnasal drip: Secondary | ICD-10-CM | POA: Diagnosis not present

## 2019-01-28 MED ORDER — NA SULFATE-K SULFATE-MG SULF 17.5-3.13-1.6 GM/177ML PO SOLN: 1.0000 | Freq: Once | ORAL | 0 refills | Status: AC

## 2019-01-28 NOTE — Patient Instructions (Addendum)
If you are age 66 or older, your body mass index should be between 23-30. Your Body mass index is 20.22 kg/m. If this is out of the aforementioned range listed, please consider follow up with your Primary Care Provider.  If you are age 28 or younger, your body mass index should be between 19-25. Your Body mass index is 20.22 kg/m. If this is out of the aformentioned range listed, please consider follow up with your Primary Care Provider.   We will place a referral to [facility], they will contact you with an appointment.  Continue with your Tagament 200 mg 2 times daily.  You have been scheduled for an endoscopy. Please follow written instructions given to you at your visit today. If you use inhalers (even only as needed), please bring them with you on the day of your procedure.  Thank you for choosing me and [facility]

## 2019-02-03 NOTE — Telephone Encounter (Addendum)
previsit and procedure cancelled per MD request; recall in place for 01/2021 for colonoscopy;  Left message for patient that procedure has been cancelled;

## 2019-02-03 NOTE — Progress Notes (Signed)
Reviewed and agree with documentation and assessment and plan. K. Veena Nandigam , MD   

## 2019-02-12 ENCOUNTER — Encounter: Payer: Medicare Other | Admitting: Gastroenterology

## 2019-02-12 ENCOUNTER — Ambulatory Visit (INDEPENDENT_AMBULATORY_CARE_PROVIDER_SITE_OTHER): Payer: Medicare Other | Admitting: Otolaryngology

## 2019-03-22 ENCOUNTER — Encounter: Payer: Self-pay | Admitting: Internal Medicine

## 2019-03-31 ENCOUNTER — Other Ambulatory Visit: Payer: Medicare Other

## 2019-04-04 ENCOUNTER — Ambulatory Visit: Payer: Medicare Other

## 2019-04-12 ENCOUNTER — Ambulatory Visit: Payer: Medicare Other

## 2019-04-15 ENCOUNTER — Ambulatory Visit: Payer: Medicare Other

## 2019-04-16 DIAGNOSIS — D485 Neoplasm of uncertain behavior of skin: Secondary | ICD-10-CM | POA: Diagnosis not present

## 2019-04-16 DIAGNOSIS — D2272 Melanocytic nevi of left lower limb, including hip: Secondary | ICD-10-CM | POA: Diagnosis not present

## 2019-04-16 DIAGNOSIS — D1801 Hemangioma of skin and subcutaneous tissue: Secondary | ICD-10-CM | POA: Diagnosis not present

## 2019-04-16 DIAGNOSIS — D225 Melanocytic nevi of trunk: Secondary | ICD-10-CM | POA: Diagnosis not present

## 2019-04-16 DIAGNOSIS — L570 Actinic keratosis: Secondary | ICD-10-CM | POA: Diagnosis not present

## 2019-04-16 DIAGNOSIS — L814 Other melanin hyperpigmentation: Secondary | ICD-10-CM | POA: Diagnosis not present

## 2019-04-16 DIAGNOSIS — L718 Other rosacea: Secondary | ICD-10-CM | POA: Diagnosis not present

## 2019-04-16 DIAGNOSIS — D2262 Melanocytic nevi of left upper limb, including shoulder: Secondary | ICD-10-CM | POA: Diagnosis not present

## 2019-04-16 DIAGNOSIS — L821 Other seborrheic keratosis: Secondary | ICD-10-CM | POA: Diagnosis not present

## 2019-04-16 DIAGNOSIS — D2271 Melanocytic nevi of right lower limb, including hip: Secondary | ICD-10-CM | POA: Diagnosis not present

## 2019-04-17 ENCOUNTER — Ambulatory Visit: Payer: Medicare Other

## 2019-04-25 ENCOUNTER — Encounter: Payer: Self-pay | Admitting: Internal Medicine

## 2019-05-06 NOTE — Progress Notes (Signed)
69 y.o. [id number] Married Caucasian female here for annual exam.    Patient has received her Covid vaccines.   PCP: Billey Gosling, MD    Patient's last menstrual period was 06/17/2011.           Sexually active: Yes.    The current method of family planning is post menopausal status.    Exercising: Yes.    walking, golf,biking, weights and yoga Smoker:  no  Health Maintenance: Pap: 04-30-18 Neg, 03-16-16 Neg:Neg HR HPV, 01-20-13 Neg:Neg HR HPV History of abnormal Pap:  no MMG:03-23-17 3D/Implants/Neg/density B/BiRads1 Colonoscopy: 01-11-16 polyp;next due 01/2021 BMD:  03-23-17 Result :osteopenia TDaP: 04-30-18 Gardasil:   no HIV: 07-21-15 NR Hep C: 07-21-15 Neg Screening Labs:  PCP.   reports that she has quit smoking. Her smoking use included cigarettes. She has never used smokeless tobacco. She reports current alcohol use of about 7.0 standard drinks of alcohol per week. She reports that she does not use drugs.  Past Medical History:  Diagnosis Date  . Anxiety   . Depression   . GERD (gastroesophageal reflux disease)    occasional   . HSV-1 infection   . Hypothyroidism   . Osteopenia   . Perennial allergic rhinitis   . 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  Marland Kitchen Vitamin D deficiency     Past Surgical History:  Procedure Laterality Date  . APPENDECTOMY  2011  . AUGMENTATION MAMMAPLASTY    . BREAST ENHANCEMENT SURGERY  1996  . COLONOSCOPY  2000 , 2007   Dr Olevia Perches. Due 2017  . NECK LIFT  11/2015  . WISDOM TOOTH EXTRACTION      Current Outpatient Medications  Medication Sig Dispense Refill  . Cholecalciferol (VITAMIN D3) 2000 units TABS Take 4,000 Units by mouth daily.     . citalopram (CELEXA) 10 MG tablet Take 10 mg by mouth daily.    . Estradiol 10 MCG TABS vaginal tablet Place one tablet (10 mcg) in the vagina twice weekly. (Patient taking differently: once a week. Place one tablet (10 mcg) in the vagina once  weekly.) 24 tablet 3  . ipratropium  (ATROVENT) 0.06 % nasal spray INSTILL 2 SPRAYS INTO EACH NOSTRIL THREE TIMES A DAY (Patient taking differently: daily as needed. INSTILL 2 SPRAYS INTO EACH NOSTRIL THREE TIMES A DAY) 15 mL 8  . levothyroxine (SYNTHROID, LEVOTHROID) 75 MCG tablet Take 1 tablet (75 mcg total) by mouth daily. 90 tablet 3  . metroNIDAZOLE (METROCREAM) 0.75 % cream Apply 1 application topically as needed.    . Multiple Vitamins-Minerals (OSTEOPRIME ULTRA) TABS as directed.     . Omega-3 Fatty Acids (FISH OIL PO) Take 1 tablet by mouth daily.    . Probiotic Product (PROBIOTIC DAILY PO) Take by mouth daily.     . valACYclovir (VALTREX) 1000 MG tablet Take 2 tablets (2000 mg) by mouth twice a day for 1 day. (Patient taking differently: as needed. Take 2 tablets (2000 mg) by mouth twice a day for 1 day.) 30 tablet 0   No current facility-administered medications for this visit.    Family History  Problem Relation Age of Onset  . Stroke Father 56  . Colon polyps Father   . Other Brother 45       OS muscle paralysis , resolution over 3 days. Neg evaluation  . Breast cancer Mother 23  . Thyroid disease Mother   . Colon cancer Paternal Grandfather   . Coronary artery disease Maternal Grandmother 63  .  Colon cancer Paternal Grandmother   . Coronary artery disease Maternal Grandfather 60  . Diabetes Neg Hx   . Esophageal cancer Neg Hx   . Rectal cancer Neg Hx   . Stomach cancer Neg Hx     Review of Systems  All other systems reviewed and are negative.   Exam:   BP 110/76   Pulse 66   Temp (!) 96.9 F (36.1 C) (Temporal)   Resp 16   Ht 5' 5.5" (1.664 m)   Wt 125 lb 6.4 oz (56.9 kg)   LMP 06/17/2011   BMI 20.55 kg/m     General appearance: alert, cooperative and appears stated age Head: normocephalic, without obvious abnormality, atraumatic Neck: no adenopathy, supple, symmetrical, trachea midline and thyroid normal to inspection and palpation Lungs: clear to auscultation bilaterally Breasts: bilateral  implants, no masses or tenderness, No nipple retraction or dimpling, No nipple discharge or bleeding, No axillary adenopathy Heart: regular rate and rhythm Abdomen: soft, non-tender; no masses, no organomegaly Extremities: extremities normal, atraumatic, no cyanosis or edema Skin: skin color, texture, turgor normal. No rashes or lesions Lymph nodes: cervical, supraclavicular, and axillary nodes normal. Neurologic: grossly normal  Pelvic: External genitalia:  no lesions              No abnormal inguinal nodes palpated.              Urethra:  normal appearing urethra with no masses, tenderness or lesions              Bartholins and Skenes: normal                 Vagina: normal appearing vagina with normal color and discharge, no lesions              Cervix: no lesions              Pap taken: No. Bimanual Exam:  Uterus:  normal size, contour, position, consistency, mobility, non-tender              Adnexa: no mass, fullness, tenderness              Rectal exam: Yes.  .  Confirms.              Anus:  normal sphincter tone, no lesions  Chaperone was present for exam.  Assessment:   Well woman visit with normal exam. Osteopenia.  Bilateral breast implants.  FH of breast cancer in mother in her 28s.  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 Valtrex prn. Hypothyroidism. Depression and anxiety. On Citalopram 5 mg.   Atrophy of vagina.  Plan: Mammogram screening and BMD at [facility].   Self breast awareness reviewed. Pap and HR HPV as above. Guidelines for Calcium, Vitamin D, regular exercise program including cardiovascular and weight bearing exercise. TFTs today.   Will wait to refill.  No Valtrex needed.  Refill of Vagifem.  She will not use until her mammogram is updated.   I discussed potential effect on breast cancer.  Follow up annually and prn.   After visit summary provided.

## 2019-05-07 ENCOUNTER — Other Ambulatory Visit: Payer: Self-pay

## 2019-05-07 ENCOUNTER — Encounter: Payer: Self-pay | Admitting: Obstetrics and Gynecology

## 2019-05-07 ENCOUNTER — Ambulatory Visit (INDEPENDENT_AMBULATORY_CARE_PROVIDER_SITE_OTHER): Payer: Medicare Other | Admitting: Obstetrics and Gynecology

## 2019-05-07 VITALS — BP 110/76 | HR 66 | Temp 96.9°F | Resp 16 | Ht 65.5 in | Wt 125.4 lb

## 2019-05-07 DIAGNOSIS — Z01419 Encounter for gynecological examination (general) (routine) without abnormal findings: Secondary | ICD-10-CM | POA: Diagnosis not present

## 2019-05-07 DIAGNOSIS — E039 Hypothyroidism, unspecified: Secondary | ICD-10-CM | POA: Diagnosis not present

## 2019-05-07 DIAGNOSIS — Z124 Encounter for screening for malignant neoplasm of cervix: Secondary | ICD-10-CM | POA: Diagnosis not present

## 2019-05-07 MED ORDER — ESTRADIOL 10 MCG VA TABS: ORAL_TABLET | VAGINAL | 3 refills | Status: DC

## 2019-05-07 NOTE — Patient Instructions (Signed)

## 2019-05-08 LAB — T4, FREE: Free T4: 2.27 ng/dL — ABNORMAL HIGH (ref 0.82–1.77)

## 2019-05-08 LAB — TSH: TSH: 2.47 u[IU]/mL (ref 0.450–4.500)

## 2019-05-10 MED ORDER — LEVOTHYROXINE SODIUM 75 MCG PO TABS: 75.0000 ug | ORAL_TABLET | Freq: Every day | ORAL | 1 refills | Status: DC

## 2019-05-10 NOTE — Addendum Note (Signed)
Addended by: Yisroel Ramming, Dietrich Pates E on: 05/10/2019 09:28 AM   Modules accepted: Orders

## 2019-05-15 DIAGNOSIS — Z20822 Contact with and (suspected) exposure to covid-19: Secondary | ICD-10-CM | POA: Diagnosis not present

## 2019-06-11 DIAGNOSIS — Z1231 Encounter for screening mammogram for malignant neoplasm of breast: Secondary | ICD-10-CM | POA: Diagnosis not present

## 2019-06-11 DIAGNOSIS — M8589 Other specified disorders of bone density and structure, multiple sites: Secondary | ICD-10-CM | POA: Diagnosis not present

## 2019-06-19 ENCOUNTER — Telehealth: Payer: Self-pay | Admitting: Obstetrics and Gynecology

## 2019-06-19 NOTE — Telephone Encounter (Signed)
Spoke with patient, advised as seen below per Dr. Silva.  Patient verbalizes understanding and is agreeable.  Encounter closed.  

## 2019-06-19 NOTE — Telephone Encounter (Signed)
Please contact patient with results of BMD. She has osteopenia, which is considered to be stable.   Her risk of major osteoporotic fracture is 11% and her risk of hip fracture is 2.2% during the next 10 years, according to the FRAX model. This is considered low risk.  No prescription medication is recommended at this time.   1200 mg of calcium daily, at least 800 IU of vitamin D daily, and weight bearing exercise will help to maintain bone density and strength.  Her next bone density is due in 2 years.

## 2019-06-22 ENCOUNTER — Other Ambulatory Visit: Payer: Self-pay | Admitting: Obstetrics and Gynecology

## 2019-06-23 ENCOUNTER — Telehealth: Payer: Self-pay

## 2019-06-23 NOTE — Telephone Encounter (Signed)
OV

## 2019-06-23 NOTE — Telephone Encounter (Signed)
New message    The patient is asking for a referral to [facility], not an emergency,   Reason: Right foot sprain ankle not healing

## 2019-06-23 NOTE — Telephone Encounter (Signed)
I am okay with referring directly-does she want with our sports medicine or [facility].  There is no [facility]

## 2019-06-24 NOTE — Telephone Encounter (Signed)
LVM for pt to call back in regards.  

## 2019-06-24 NOTE — Telephone Encounter (Signed)
Medication refill request: Estradiol 10 mcg vaginal tablets Last AEX:  05/07/2019 Next AEX: 07/07/2020 Last MMG (if hormonal medication request): 03-23-17 3D/Implants/Neg/density B/BiRads1 Refill authorized: Declined. Spoke with pharmacy and patient still has refills. Rx was sent for 1 year supply on 05/07/19 by Dr.Silva.

## 2019-06-25 NOTE — Telephone Encounter (Signed)
Spoke with pt in regards and she states she is doing better and would like to hold off on the referral for now but will call back if she changes her mind.

## 2019-07-01 ENCOUNTER — Encounter: Payer: Self-pay | Admitting: Obstetrics and Gynecology

## 2019-07-01 DIAGNOSIS — S93401A Sprain of unspecified ligament of right ankle, initial encounter: Secondary | ICD-10-CM | POA: Diagnosis not present

## 2019-08-29 ENCOUNTER — Other Ambulatory Visit: Payer: Self-pay

## 2019-08-29 ENCOUNTER — Other Ambulatory Visit (INDEPENDENT_AMBULATORY_CARE_PROVIDER_SITE_OTHER): Payer: Medicare Other

## 2019-08-29 DIAGNOSIS — E039 Hypothyroidism, unspecified: Secondary | ICD-10-CM

## 2019-08-30 LAB — T4, FREE: Free T4: 1.77 ng/dL (ref 0.82–1.77)

## 2019-08-30 LAB — TSH: TSH: 1.87 u[IU]/mL (ref 0.450–4.500)

## 2019-10-03 DIAGNOSIS — K130 Diseases of lips: Secondary | ICD-10-CM | POA: Diagnosis not present

## 2019-10-08 ENCOUNTER — Telehealth: Payer: Self-pay | Admitting: Internal Medicine

## 2019-10-08 MED ORDER — OMEPRAZOLE 20 MG PO CPDR: 20.0000 mg | DELAYED_RELEASE_CAPSULE | Freq: Every day | ORAL | 1 refills | Status: DC

## 2019-10-08 NOTE — Telephone Encounter (Signed)
New message:    Pt is calling to see if Dr. Quay Burow will prescribe her some Omeprazole for her acid reflux. She states she would like the same dosage as before. Please advise.    [facility] [place] [place] - [place]

## 2019-10-08 NOTE — Addendum Note (Signed)
Addended by: Binnie Rail on: 10/08/2019 02:29 PM   Modules accepted: Orders

## 2019-10-08 NOTE — Telephone Encounter (Signed)
sent 

## 2019-11-21 ENCOUNTER — Other Ambulatory Visit: Payer: Self-pay | Admitting: Obstetrics and Gynecology

## 2019-11-21 ENCOUNTER — Other Ambulatory Visit: Payer: Self-pay | Admitting: Internal Medicine

## 2019-11-21 DIAGNOSIS — E039 Hypothyroidism, unspecified: Secondary | ICD-10-CM

## 2019-11-21 DIAGNOSIS — F3289 Other specified depressive episodes: Secondary | ICD-10-CM

## 2019-11-21 NOTE — Telephone Encounter (Signed)
Please contact patient regarding refill requests.   I can refill her Synthroid 0.075 mg daily.  #90, RF 2.  Her PCP has been prescribing her Citalopram.  Please have her contact their office for this prescription.

## 2019-11-21 NOTE — Telephone Encounter (Signed)
Prescription for synthroid sent in per Dr. Quincy Simmonds #90, 2RF. Prescription for citalopram refused with note that PCP is to fill.   Routing to provider and will close encounter.

## 2019-11-21 NOTE — Telephone Encounter (Signed)
Medication refill request: Levothyroxine 0.078mcg   Last AEX:  05/10/19 Next AEX: 07/07/20 Last MMG (if hormonal medication request): 06/11/19 neg  Refill authorized: 90/1    Medication refill request: Citalopram 10mg   Last AEX:  05/10/19 Next AEX: 07/07/20 Last MMG (if hormonal medication request): 06/11/19 neg  Refill authorized: 90/1

## 2019-11-21 NOTE — Telephone Encounter (Signed)
Patient returned call. Patient advised of message as seen below from Dr. Quincy Simmonds. Patient states she will contact pharmacy to have them send request for citalopram to PCP.

## 2019-11-21 NOTE — Telephone Encounter (Signed)
Message left to return call to Triage Nurse at [phone].    

## 2019-12-01 DIAGNOSIS — Z23 Encounter for immunization: Secondary | ICD-10-CM | POA: Diagnosis not present

## 2019-12-14 ENCOUNTER — Encounter: Payer: Self-pay | Admitting: Internal Medicine

## 2020-01-06 NOTE — Patient Instructions (Addendum)
°  Blood work was ordered.    All other Health Maintenance issues reviewed.   All recommended immunizations and age-appropriate screenings are up-to-date or discussed.  No immunization administered today.   Medications reviewed and updated.  Changes include :   none  Your prescription(s) have been submitted to your pharmacy. Please take as directed and contact our office if you believe you are having problem(s) with the medication(s).   Please followup in 1 year

## 2020-01-06 NOTE — Progress Notes (Signed)
Subjective:    Patient ID: Rachel Knight, female    DOB: 08-27-1950, 69 y.o.   MRN: [id number]  HPI The patient is here for follow up of their chronic medical problems, including hypothyroidism, GERD, osteopenia, anxiety/depression.   She is taking all of her medications as prescribed.   She is exercising regularly.   Stretches, yoga    Medications and allergies reviewed with patient and updated if appropriate.  Patient Active Problem List   Diagnosis Date Noted  . Generalized abdominal pain 11/19/2018  . Arthralgia of left temporomandibular joint 12/05/2017  . Food sensitivity with gastrointestinal symptoms 03/27/2017  . GERD without esophagitis 03/27/2017  . Elevated troponin 02/17/2016  . Internal hemorrhoids 02/17/2016  . Anxiety and depression 01/28/2016  . 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 Hypothyroidism 11/30/2009  . Vitamin D deficiency 11/30/2009  . Allergic rhinitis 11/30/2009  . Osteopenia 11/30/2009    Current Outpatient Medications on File Prior to Visit  Medication Sig Dispense Refill  . Cholecalciferol (VITAMIN D3) 2000 units TABS Take 4,000 Units by mouth daily.     . Estradiol 10 MCG TABS vaginal tablet Place one tablet (10 mcg) in the vagina twice weekly. 24 tablet 3  . ipratropium (ATROVENT) 0.06 % nasal spray INSTILL 2 SPRAYS INTO EACH NOSTRIL THREE TIMES A DAY (Patient taking differently: daily as needed. INSTILL 2 SPRAYS INTO EACH NOSTRIL THREE TIMES A DAY) 15 mL 8  . levothyroxine (SYNTHROID) 75 MCG tablet TAKE 1 TABLET(75 MCG) BY MOUTH DAILY 90 tablet 2  . metroNIDAZOLE (METROCREAM) 0.75 % cream Apply 1 application topically as needed.    . Multiple Vitamins-Minerals (OSTEOPRIME ULTRA) TABS as directed.     . Probiotic Product (PROBIOTIC DAILY PO) Take by mouth daily.     . valACYclovir (VALTREX) 1000 MG tablet Take 2 tablets (2000 mg) by mouth twice a day for 1 day. (Patient taking differently: as needed. Take 2 tablets (2000 mg) by mouth twice a day  for 1 day.) 30 tablet 0   No current facility-administered medications on file prior to visit.    Past Medical History:  Diagnosis Date  . Anxiety   . Depression   . GERD (gastroesophageal reflux disease)    occasional   . HSV-1 infection   . Hypothyroidism   . Osteopenia   . Perennial allergic rhinitis   . 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  Marland Kitchen Vitamin D deficiency     Past Surgical History:  Procedure Laterality Date  . APPENDECTOMY  2011  . AUGMENTATION MAMMAPLASTY    . BREAST ENHANCEMENT SURGERY  1996  . COLONOSCOPY  2000 , 2007   Dr Olevia Perches. Due 2017  . NECK LIFT  11/2015  . WISDOM TOOTH EXTRACTION      Social History   Socioeconomic History  . Marital status: Married    Spouse name: Not on file  . Number of children: 2  . Years of education: Not on file  . Highest education level: Not on file  Occupational History  . Occupation: retired  Tobacco Use  . Smoking status: Former Smoker    Types: Cigarettes  . Smokeless tobacco: Never Used  . Tobacco comment: only smoked age [age] , 1 ppweek  Vaping Use  . Vaping Use: Never used  Substance and Sexual Activity  . Alcohol use: Yes    Alcohol/week: 7.0 standard drinks    Types: 7 Shots of liquor per week    Comment: 1 drinks/day  .  Drug use: No  . Sexual activity: Yes    Partners: Male    Birth control/protection: Post-menopausal  Other Topics Concern  . Not on file  Social History Narrative   Exercise: weights, walking, pilates, swimming   Social Determinants of Health   Financial Resource Strain:   . Difficulty of Paying Living Expenses: Not on file  Food Insecurity:   . Worried About Charity fundraiser in the Last Year: Not on file  . Ran Out of Food in the Last Year: Not on file  Transportation Needs:   . Lack of Transportation (Medical): Not on file  . Lack of Transportation (Non-Medical): Not on file  Physical Activity:   . Days of Exercise per Week: Not on file  .  Minutes of Exercise per Session: Not on file  Stress:   . Feeling of Stress : Not on file  Social Connections:   . Frequency of Communication with Friends and Family: Not on file  . Frequency of Social Gatherings with Friends and Family: Not on file  . Attends Religious Services: Not on file  . Active Member of Clubs or Organizations: Not on file  . Attends Archivist Meetings: Not on file  . Marital Status: Not on file    Family History  Problem Relation Age of Onset  . Stroke Father 78  . Colon polyps Father   . Other Brother 45       OS muscle paralysis , resolution over 3 days. Neg evaluation  . Breast cancer Mother 51  . Thyroid disease Mother   . Colon cancer Paternal Grandfather   . Coronary artery disease Maternal Grandmother 63  . Colon cancer Paternal Grandmother   . Coronary artery disease Maternal Grandfather 60  . Diabetes Neg Hx   . Esophageal cancer Neg Hx   . Rectal cancer Neg Hx   . Stomach cancer Neg Hx     Review of Systems  Constitutional: Negative for chills and fever.  Respiratory: Negative for cough, shortness of breath and wheezing.   Cardiovascular: Negative for chest pain, palpitations and leg swelling.  Gastrointestinal: Positive for anal bleeding (hemorrhoids). Negative for abdominal pain, blood in stool, constipation, diarrhea and nausea.  Genitourinary: Negative for dysuria and hematuria.  Musculoskeletal: Negative for arthralgias and back pain.  Neurological: Positive for headaches (TMJ related). Negative for dizziness and light-headedness.  Psychiatric/Behavioral: Positive for dysphoric mood. The patient is nervous/anxious.        Objective:   Vitals:   01/07/20 0857  BP: 106/78  Pulse: 68  Temp: 98 F (36.7 C)  SpO2: 97%   BP Readings from Last 3 Encounters:  01/07/20 106/78  05/07/19 110/76  01/28/19 104/62   Wt Readings from Last 3 Encounters:  01/07/20 124 lb (56.2 kg)  05/07/19 125 lb 6.4 oz (56.9 kg)  01/28/19  126 lb 4 oz (57.3 kg)   Body mass index is 20.32 kg/m.   Physical Exam    Constitutional: Appears well-developed and well-nourished. No distress.  HENT:  Head: Normocephalic and atraumatic.  Neck: Neck supple. No tracheal deviation present. No thyromegaly present.  No cervical lymphadenopathy Cardiovascular: Normal rate, regular rhythm and normal heart sounds.   No murmur heard. No carotid bruit .  No edema Pulmonary/Chest: Effort normal and breath sounds normal. No respiratory distress. No has no wheezes. No rales.  Skin: Skin is warm and dry. Not diaphoretic.  Psychiatric: Normal mood and affect. Behavior is normal.  Assessment & Plan:    See Problem List for Assessment and Plan of chronic medical problems.    This visit occurred during the SARS-CoV-2 public health emergency.  Safety protocols were in place, including screening questions prior to the visit, additional usage of staff PPE, and extensive cleaning of exam room while observing appropriate contact time as indicated for disinfecting solutions.

## 2020-01-07 ENCOUNTER — Encounter: Payer: Self-pay | Admitting: Internal Medicine

## 2020-01-07 ENCOUNTER — Ambulatory Visit (INDEPENDENT_AMBULATORY_CARE_PROVIDER_SITE_OTHER): Payer: Medicare Other | Admitting: Internal Medicine

## 2020-01-07 ENCOUNTER — Other Ambulatory Visit: Payer: Self-pay

## 2020-01-07 VITALS — BP 106/78 | HR 68 | Temp 98.0°F | Ht 65.5 in | Wt 124.0 lb

## 2020-01-07 DIAGNOSIS — K219 Gastro-esophageal reflux disease without esophagitis: Secondary | ICD-10-CM

## 2020-01-07 DIAGNOSIS — M85852 Other specified disorders of bone density and structure, left thigh: Secondary | ICD-10-CM | POA: Diagnosis not present

## 2020-01-07 DIAGNOSIS — M26622 Arthralgia of left temporomandibular joint: Secondary | ICD-10-CM

## 2020-01-07 DIAGNOSIS — F32A Depression, unspecified: Secondary | ICD-10-CM | POA: Diagnosis not present

## 2020-01-07 DIAGNOSIS — F419 Anxiety disorder, unspecified: Secondary | ICD-10-CM

## 2020-01-07 DIAGNOSIS — B002 Herpesviral gingivostomatitis and pharyngotonsillitis: Secondary | ICD-10-CM | POA: Diagnosis not present

## 2020-01-07 DIAGNOSIS — R1084 Generalized abdominal pain: Secondary | ICD-10-CM

## 2020-01-07 DIAGNOSIS — M85851 Other specified disorders of bone density and structure, right thigh: Secondary | ICD-10-CM | POA: Diagnosis not present

## 2020-01-07 DIAGNOSIS — E038 Other specified hypothyroidism: Secondary | ICD-10-CM | POA: Diagnosis not present

## 2020-01-07 DIAGNOSIS — E559 Vitamin D deficiency, unspecified: Secondary | ICD-10-CM | POA: Diagnosis not present

## 2020-01-07 LAB — CBC WITH DIFFERENTIAL/PLATELET
Basophils Absolute: 0 10*3/uL (ref 0.0–0.1)
Basophils Relative: 0.7 % (ref 0.0–3.0)
Eosinophils Absolute: 0.1 10*3/uL (ref 0.0–0.7)
Eosinophils Relative: 2.8 % (ref 0.0–5.0)
HCT: 41.2 % (ref 36.0–46.0)
Hemoglobin: 13.7 g/dL (ref 12.0–15.0)
Lymphocytes Relative: 33.9 % (ref 12.0–46.0)
Lymphs Abs: 1.6 10*3/uL (ref 0.7–4.0)
MCHC: 33.3 g/dL (ref 30.0–36.0)
MCV: 91.8 fl (ref 78.0–100.0)
Monocytes Absolute: 0.4 10*3/uL (ref 0.1–1.0)
Monocytes Relative: 8.6 % (ref 3.0–12.0)
Neutro Abs: 2.6 10*3/uL (ref 1.4–7.7)
Neutrophils Relative %: 54 % (ref 43.0–77.0)
Platelets: 224 10*3/uL (ref 150.0–400.0)
RBC: 4.48 Mil/uL (ref 3.87–5.11)
RDW: 12.8 % (ref 11.5–15.5)
WBC: 4.7 10*3/uL (ref 4.0–10.5)

## 2020-01-07 LAB — COMPREHENSIVE METABOLIC PANEL
ALT: 16 U/L (ref 0–35)
AST: 20 U/L (ref 0–37)
Albumin: 4.3 g/dL (ref 3.5–5.2)
Alkaline Phosphatase: 56 U/L (ref 39–117)
BUN: 14 mg/dL (ref 6–23)
CO2: 27 mEq/L (ref 19–32)
Calcium: 9.4 mg/dL (ref 8.4–10.5)
Chloride: 108 mEq/L (ref 96–112)
Creatinine, Ser: 0.86 mg/dL (ref 0.40–1.20)
GFR: 69.88 mL/min (ref >60.00)
Glucose, Bld: 67 mg/dL — ABNORMAL LOW (ref 70–99)
Potassium: 4.2 mEq/L (ref 3.5–5.1)
Sodium: 140 mEq/L (ref 135–145)
Total Bilirubin: 0.6 mg/dL (ref 0.2–1.2)
Total Protein: 6.6 g/dL (ref 6.0–8.3)

## 2020-01-07 LAB — VITAMIN D 25 HYDROXY (VIT D DEFICIENCY, FRACTURES): VITD: 59 ng/mL (ref 30.00–100.00)

## 2020-01-07 MED ORDER — CITALOPRAM HYDROBROMIDE 10 MG PO TABS: 10.0000 mg | ORAL_TABLET | Freq: Every day | ORAL | 3 refills | Status: DC

## 2020-01-07 NOTE — Assessment & Plan Note (Addendum)
Chronic GERD controlled with diet Compliant with a GERD diet

## 2020-01-07 NOTE — Assessment & Plan Note (Signed)
Chronic dexa up to date Exercises regularly Taking vitamin d

## 2020-01-07 NOTE — Assessment & Plan Note (Signed)
Chronic  Clinically euthyroid Currently taking levothyroxine 75 mcg Managed by gyn

## 2020-01-07 NOTE — Assessment & Plan Note (Signed)
Seeing a neuromuscular dentist

## 2020-01-07 NOTE — Assessment & Plan Note (Signed)
Chronic Intermittent Taking valtrex prn, continue

## 2020-01-07 NOTE — Addendum Note (Signed)
Addended by: Trenda Moots on: 91/0/6816 09:22 AM   Modules accepted: Orders

## 2020-01-07 NOTE — Assessment & Plan Note (Signed)
Chronic Controlled, stable Continue celexa 10 mg daily  

## 2020-01-19 DIAGNOSIS — Z23 Encounter for immunization: Secondary | ICD-10-CM | POA: Diagnosis not present

## 2020-01-20 ENCOUNTER — Other Ambulatory Visit: Payer: Self-pay

## 2020-01-20 ENCOUNTER — Ambulatory Visit (INDEPENDENT_AMBULATORY_CARE_PROVIDER_SITE_OTHER): Payer: Medicare Other | Admitting: Podiatry

## 2020-01-20 DIAGNOSIS — L600 Ingrowing nail: Secondary | ICD-10-CM

## 2020-01-20 DIAGNOSIS — M79674 Pain in right toe(s): Secondary | ICD-10-CM | POA: Diagnosis not present

## 2020-01-20 NOTE — Patient Instructions (Signed)
Soak Instructions    THE DAY AFTER THE PROCEDURE  Place 1/4 cup of epsom salts in a quart of warm tap water.  Submerge your foot or feet with outer bandage intact for the initial soak; this will allow the bandage to become moist and wet for easy lift off.  Once you remove your bandage, continue to soak in the solution for 20 minutes.  This soak should be done twice a day.  Next, remove your foot or feet from solution, blot dry the affected area and cover.  You may use a band aid large enough to cover the area or use gauze and tape.  Apply other medications to the area as directed by the doctor such as polysporin neosporin.  IF YOUR SKIN BECOMES IRRITATED WHILE USING THESE INSTRUCTIONS, IT IS OKAY TO SWITCH TO  WHITE VINEGAR AND WATER. Or you may use antibacterial soap and water to keep the toe clean  Monitor for any signs/symptoms of infection. Call the office immediately if any occur or go directly to the emergency room. Call with any questions/concerns.    Ingrown Toenail An ingrown toenail occurs when the corner or sides of a toenail grow into the surrounding skin. This causes discomfort and pain. The big toe is most commonly affected, but any of the toes can be affected. If an ingrown toenail is not treated, it can become infected. What are the causes? This condition may be caused by:  Wearing shoes that are too small or tight.  An injury, such as stubbing your toe or having your toe stepped on.  Improper cutting or care of your toenails.  Having nail or foot abnormalities that were present from birth (congenital abnormalities), such as having a nail that is too big for your toe. What increases the risk? The following factors may make you more likely to develop ingrown toenails:  Age. Nails tend to get thicker with age, so ingrown nails are more common among older people.  Cutting your toenails incorrectly, such as cutting them very short or cutting them unevenly. An ingrown  toenail is more likely to get infected if you have:  Diabetes.  Blood flow (circulation) problems. What are the signs or symptoms? Symptoms of an ingrown toenail may include:  Pain, soreness, or tenderness.  Redness.  Swelling.  Hardening of the skin that surrounds the toenail. Signs that an ingrown toenail may be infected include:  Fluid or pus.  Symptoms that get worse instead of better. How is this diagnosed? An ingrown toenail may be diagnosed based on your medical history, your symptoms, and a physical exam. If you have fluid or blood coming from your toenail, a sample may be collected to test for the specific type of bacteria that is causing the infection. How is this treated? Treatment depends on how severe your ingrown toenail is. You may be able to care for your toenail at home.  If you have an infection, you may be prescribed antibiotic medicines.  If you have fluid or pus draining from your toenail, your health care provider may drain it.  If you have trouble walking, you may be given crutches to use.  If you have a severe or infected ingrown toenail, you may need a procedure to remove part or all of the nail. Follow these instructions at home: Foot care   Do not pick at your toenail or try to remove it yourself.  Soak your foot in warm, soapy water. Do this for 20 minutes, 3 times  a day, or as often as told by your health care provider. This helps to keep your toe clean and keep your skin soft.  Wear shoes that fit well and are not too tight. Your health care provider may recommend that you wear open-toed shoes while you heal.  Trim your toenails regularly and carefully. Cut your toenails straight across to prevent injury to the skin at the corners of the toenail. Do not cut your nails in a curved shape.  Keep your feet clean and dry to help prevent infection. Medicines  Take over-the-counter and prescription medicines only as told by your health care  provider.  If you were prescribed an antibiotic, take it as told by your health care provider. Do not stop taking the antibiotic even if you start to feel better. Activity  Return to your normal activities as told by your health care provider. Ask your health care provider what activities are safe for you.  Avoid activities that cause pain. General instructions  If your health care provider told you to use crutches to help you move around, use them as instructed.  Keep all follow-up visits as told by your health care provider. This is important. Contact a health care provider if:  You have more redness, swelling, pain, or other symptoms that do not improve with treatment.  You have fluid, blood, or pus coming from your toenail. Get help right away if:  You have a red streak on your skin that starts at your foot and spreads up your leg.  You have a fever. Summary  An ingrown toenail occurs when the corner or sides of a toenail grow into the surrounding skin. This causes discomfort and pain. The big toe is most commonly affected, but any of the toes can be affected.  If an ingrown toenail is not treated, it can become infected.  Fluid or pus draining from your toenail is a sign of infection. Your health care provider may need to drain it. You may be given antibiotics to treat the infection.  Trimming your toenails regularly and properly can help you prevent an ingrown toenail. This information is not intended to replace advice given to you by your health care provider. Make sure you discuss any questions you have with your health care provider. Document Revised: 06/14/2018 Document Reviewed: 11/08/2016 Elsevier Patient Education  [place].

## 2020-01-26 NOTE — Progress Notes (Signed)
Subjective:   Patient ID: Rachel Knight, female   DOB: 69 y.o.   MRN: [id number]   HPI 69 year old female presents the office for concerns of ingrown toenail right big toe, medial aspect.  Seen on the following month denies recent injury.  No drainage or pus.  No swelling.  Area is tender with pressure at times.  No other concerns.   Review of Systems  All other systems reviewed and are negative.  Past Medical History:  Diagnosis Date  . Anxiety   . Depression   . GERD (gastroesophageal reflux disease)    occasional   . HSV-1 infection   . Hypothyroidism   . Osteopenia   . Perennial allergic rhinitis   . 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  Marland Kitchen Vitamin D deficiency     Past Surgical History:  Procedure Laterality Date  . APPENDECTOMY  2011  . AUGMENTATION MAMMAPLASTY    . BREAST ENHANCEMENT SURGERY  1996  . COLONOSCOPY  2000 , 2007   Dr Olevia Perches. Due 2017  . NECK LIFT  11/2015  . WISDOM TOOTH EXTRACTION       Current Outpatient Medications:  .  Cholecalciferol (VITAMIN D3) 2000 units TABS, Take 4,000 Units by mouth daily. , Disp: , Rfl:  .  citalopram (CELEXA) 10 MG tablet, Take 1 tablet (10 mg total) by mouth daily., Disp: 90 tablet, Rfl: 3 .  Estradiol 10 MCG TABS vaginal tablet, Place one tablet (10 mcg) in the vagina twice weekly., Disp: 24 tablet, Rfl: 3 .  ipratropium (ATROVENT) 0.06 % nasal spray, INSTILL 2 SPRAYS INTO EACH NOSTRIL THREE TIMES A DAY (Patient taking differently: daily as needed. INSTILL 2 SPRAYS INTO EACH NOSTRIL THREE TIMES A DAY), Disp: 15 mL, Rfl: 8 .  levothyroxine (SYNTHROID) 75 MCG tablet, TAKE 1 TABLET(75 MCG) BY MOUTH DAILY, Disp: 90 tablet, Rfl: 2 .  metroNIDAZOLE (METROCREAM) 0.75 % cream, Apply 1 application topically as needed., Disp: , Rfl:  .  Multiple Vitamins-Minerals (OSTEOPRIME ULTRA) TABS, as directed. , Disp: , Rfl:  .  Probiotic Product (PROBIOTIC DAILY PO), Take by mouth daily. , Disp: , Rfl:  .  valACYclovir  (VALTREX) 1000 MG tablet, Take 2 tablets (2000 mg) by mouth twice a day for 1 day. (Patient taking differently: as needed. Take 2 tablets (2000 mg) by mouth twice a day for 1 day.), Disp: 30 tablet, Rfl: 0  Allergies  Allergen Reactions  . Other     Food sensitivities  . Augmentin [Amoxicillin-Pot Clavulanate] Other (See Comments)    GERD, upset stomach        Objective:  Physical Exam  General: AAO x3, NAD  Dermatological: Incurvation present to medial aspect of right hallux toenail with minimal edema.  No significant erythema, drainage or pus any signs of infection.  No open lesions.  Vascular: Dorsalis Pedis artery and Posterior Tibial artery pedal pulses are 2/4 bilateral with immedate capillary fill time.There is no pain with calf compression, swelling, warmth, erythema.   Neruologic: Grossly intact via light touch bilateral.  Musculoskeletal: No gross boney pedal deformities bilateral. No pain, crepitus, or limitation noted with foot and ankle range of motion bilateral. Muscular strength 5/5 in all groups tested bilateral.  Gait: Unassisted, Nonantalgic.       Assessment:   69 year old female right hallux ingrown toenail     Plan:  -Treatment options discussed including all alternatives, risks, and complications -Etiology of symptoms were discussed -Likely debrided the nail with any  complications.  Discussed partial nail avulsion if needed we held off on this today.  Recommend Epson salt soaks daily.  Antibiotic ointment.  Monitoring signs or symptoms of infection or recurrence of discomfort.  Trula Slade DPM

## 2020-02-17 ENCOUNTER — Other Ambulatory Visit: Payer: Self-pay | Admitting: Internal Medicine

## 2020-02-18 DIAGNOSIS — H43811 Vitreous degeneration, right eye: Secondary | ICD-10-CM | POA: Diagnosis not present

## 2020-02-18 DIAGNOSIS — H04121 Dry eye syndrome of right lacrimal gland: Secondary | ICD-10-CM | POA: Diagnosis not present

## 2020-02-18 DIAGNOSIS — H5310 Unspecified subjective visual disturbances: Secondary | ICD-10-CM | POA: Diagnosis not present

## 2020-02-24 ENCOUNTER — Encounter: Payer: Self-pay | Admitting: Internal Medicine

## 2020-04-14 ENCOUNTER — Encounter: Payer: Self-pay | Admitting: Internal Medicine

## 2020-04-14 ENCOUNTER — Telehealth (INDEPENDENT_AMBULATORY_CARE_PROVIDER_SITE_OTHER): Payer: Medicare Other | Admitting: Internal Medicine

## 2020-04-14 DIAGNOSIS — J019 Acute sinusitis, unspecified: Secondary | ICD-10-CM

## 2020-04-14 MED ORDER — CEFDINIR 300 MG PO CAPS: 300.0000 mg | ORAL_CAPSULE | Freq: Two times a day (BID) | ORAL | 0 refills | Status: DC

## 2020-04-14 NOTE — Assessment & Plan Note (Signed)
Acute Likely bacterial  StartCefdinir 300 mg BID x 10 day otc cold medications Rest, fluid Call if no improvement

## 2020-04-14 NOTE — Progress Notes (Signed)
Virtual Visit via Video Note  I connected with Rachel Knight on 04/14/20 at  2:15 PM EST by a video enabled telemedicine application and verified that I am speaking with the correct person using two identifiers.   I discussed the limitations of evaluation and management by telemedicine and the availability of in person appointments. The patient expressed understanding and agreed to proceed.  Present for the visit:  Myself, Dr Billey Gosling, Deanne Coffer.  The patient is currently at home and I am in the office.    No referring provider.    History of Present Illness: She is here for an acute visit for cold symptoms.   Her symptoms started about one week ago.   She is experiencing dark green nasal mucus, coughing up mucus, temp 96.8, hoarseness, sinus pressure, headaches.    She is feeling better than this morning - took sudafed.   She has tried taking sudafed, nyquil, dayquil   covid at home test negative.  Review of Systems  Constitutional: Positive for fever (low temp 96.8).  HENT: Positive for congestion and sinus pain. Negative for ear pain and sore throat.   Respiratory: Positive for cough. Negative for shortness of breath and wheezing.   Neurological: Positive for headaches.      Social History   Socioeconomic History  . Marital status: Married    Spouse name: Not on file  . Number of children: 2  . Years of education: Not on file  . Highest education level: Not on file  Occupational History  . Occupation: retired  Tobacco Use  . Smoking status: Former Smoker    Types: Cigarettes  . Smokeless tobacco: Never Used  . Tobacco comment: only smoked age [age] , 1 ppweek  Vaping Use  . Vaping Use: Never used  Substance and Sexual Activity  . Alcohol use: Yes    Alcohol/week: 7.0 standard drinks    Types: 7 Shots of liquor per week    Comment: 1 drinks/day  . Drug use: No  . Sexual activity: Yes    Partners: Male    Birth control/protection: Post-menopausal   Other Topics Concern  . Not on file  Social History Narrative   Exercise: weights, walking, pilates, swimming   Social Determinants of Health   Financial Resource Strain: Not on file  Food Insecurity: Not on file  Transportation Needs: Not on file  Physical Activity: Not on file  Stress: Not on file  Social Connections: Not on file     Observations/Objective: Appears well in NAD Breathing normally  Assessment and Plan:  See Problem List for Assessment and Plan of chronic medical problems.   Follow Up Instructions:    I discussed the assessment and treatment plan with the patient. The patient was provided an opportunity to ask questions and all were answered. The patient agreed with the plan and demonstrated an understanding of the instructions.   The patient was advised to call back or seek an in-person evaluation if the symptoms worsen or if the condition fails to improve as anticipated.    Binnie Rail, MD

## 2020-04-19 ENCOUNTER — Telehealth: Payer: Self-pay | Admitting: Internal Medicine

## 2020-04-19 NOTE — Telephone Encounter (Signed)
LVM for pt to rtn my call to schedule awv with nha. Please schedule appt if pt calls the office.  

## 2020-05-03 DIAGNOSIS — H43811 Vitreous degeneration, right eye: Secondary | ICD-10-CM | POA: Diagnosis not present

## 2020-05-11 ENCOUNTER — Encounter: Payer: Self-pay | Admitting: Internal Medicine

## 2020-05-17 DIAGNOSIS — M5137 Other intervertebral disc degeneration, lumbosacral region: Secondary | ICD-10-CM | POA: Diagnosis not present

## 2020-05-17 DIAGNOSIS — M50122 Cervical disc disorder at C5-C6 level with radiculopathy: Secondary | ICD-10-CM | POA: Diagnosis not present

## 2020-05-17 DIAGNOSIS — M9901 Segmental and somatic dysfunction of cervical region: Secondary | ICD-10-CM | POA: Diagnosis not present

## 2020-05-17 DIAGNOSIS — M9903 Segmental and somatic dysfunction of lumbar region: Secondary | ICD-10-CM | POA: Diagnosis not present

## 2020-05-25 DIAGNOSIS — M9901 Segmental and somatic dysfunction of cervical region: Secondary | ICD-10-CM | POA: Diagnosis not present

## 2020-05-25 DIAGNOSIS — M9903 Segmental and somatic dysfunction of lumbar region: Secondary | ICD-10-CM | POA: Diagnosis not present

## 2020-05-25 DIAGNOSIS — M50122 Cervical disc disorder at C5-C6 level with radiculopathy: Secondary | ICD-10-CM | POA: Diagnosis not present

## 2020-05-25 DIAGNOSIS — M5137 Other intervertebral disc degeneration, lumbosacral region: Secondary | ICD-10-CM | POA: Diagnosis not present

## 2020-05-27 DIAGNOSIS — M9901 Segmental and somatic dysfunction of cervical region: Secondary | ICD-10-CM | POA: Diagnosis not present

## 2020-05-27 DIAGNOSIS — M9903 Segmental and somatic dysfunction of lumbar region: Secondary | ICD-10-CM | POA: Diagnosis not present

## 2020-05-27 DIAGNOSIS — M50122 Cervical disc disorder at C5-C6 level with radiculopathy: Secondary | ICD-10-CM | POA: Diagnosis not present

## 2020-05-27 DIAGNOSIS — M5137 Other intervertebral disc degeneration, lumbosacral region: Secondary | ICD-10-CM | POA: Diagnosis not present

## 2020-06-01 DIAGNOSIS — M50122 Cervical disc disorder at C5-C6 level with radiculopathy: Secondary | ICD-10-CM | POA: Diagnosis not present

## 2020-06-01 DIAGNOSIS — M9903 Segmental and somatic dysfunction of lumbar region: Secondary | ICD-10-CM | POA: Diagnosis not present

## 2020-06-01 DIAGNOSIS — M9901 Segmental and somatic dysfunction of cervical region: Secondary | ICD-10-CM | POA: Diagnosis not present

## 2020-06-01 DIAGNOSIS — M5137 Other intervertebral disc degeneration, lumbosacral region: Secondary | ICD-10-CM | POA: Diagnosis not present

## 2020-06-02 DIAGNOSIS — M9901 Segmental and somatic dysfunction of cervical region: Secondary | ICD-10-CM | POA: Diagnosis not present

## 2020-06-02 DIAGNOSIS — M9903 Segmental and somatic dysfunction of lumbar region: Secondary | ICD-10-CM | POA: Diagnosis not present

## 2020-06-02 DIAGNOSIS — M5137 Other intervertebral disc degeneration, lumbosacral region: Secondary | ICD-10-CM | POA: Diagnosis not present

## 2020-06-02 DIAGNOSIS — M50122 Cervical disc disorder at C5-C6 level with radiculopathy: Secondary | ICD-10-CM | POA: Diagnosis not present

## 2020-06-07 DIAGNOSIS — M5137 Other intervertebral disc degeneration, lumbosacral region: Secondary | ICD-10-CM | POA: Diagnosis not present

## 2020-06-07 DIAGNOSIS — M9903 Segmental and somatic dysfunction of lumbar region: Secondary | ICD-10-CM | POA: Diagnosis not present

## 2020-06-07 DIAGNOSIS — M50122 Cervical disc disorder at C5-C6 level with radiculopathy: Secondary | ICD-10-CM | POA: Diagnosis not present

## 2020-06-07 DIAGNOSIS — M9901 Segmental and somatic dysfunction of cervical region: Secondary | ICD-10-CM | POA: Diagnosis not present

## 2020-06-08 DIAGNOSIS — M9903 Segmental and somatic dysfunction of lumbar region: Secondary | ICD-10-CM | POA: Diagnosis not present

## 2020-06-08 DIAGNOSIS — M50122 Cervical disc disorder at C5-C6 level with radiculopathy: Secondary | ICD-10-CM | POA: Diagnosis not present

## 2020-06-08 DIAGNOSIS — M9901 Segmental and somatic dysfunction of cervical region: Secondary | ICD-10-CM | POA: Diagnosis not present

## 2020-06-08 DIAGNOSIS — M5137 Other intervertebral disc degeneration, lumbosacral region: Secondary | ICD-10-CM | POA: Diagnosis not present

## 2020-06-14 DIAGNOSIS — M9903 Segmental and somatic dysfunction of lumbar region: Secondary | ICD-10-CM | POA: Diagnosis not present

## 2020-06-14 DIAGNOSIS — M5137 Other intervertebral disc degeneration, lumbosacral region: Secondary | ICD-10-CM | POA: Diagnosis not present

## 2020-06-14 DIAGNOSIS — M9901 Segmental and somatic dysfunction of cervical region: Secondary | ICD-10-CM | POA: Diagnosis not present

## 2020-06-14 DIAGNOSIS — M50122 Cervical disc disorder at C5-C6 level with radiculopathy: Secondary | ICD-10-CM | POA: Diagnosis not present

## 2020-06-28 DIAGNOSIS — M9903 Segmental and somatic dysfunction of lumbar region: Secondary | ICD-10-CM | POA: Diagnosis not present

## 2020-06-28 DIAGNOSIS — M5137 Other intervertebral disc degeneration, lumbosacral region: Secondary | ICD-10-CM | POA: Diagnosis not present

## 2020-06-28 DIAGNOSIS — M50122 Cervical disc disorder at C5-C6 level with radiculopathy: Secondary | ICD-10-CM | POA: Diagnosis not present

## 2020-06-28 DIAGNOSIS — M9901 Segmental and somatic dysfunction of cervical region: Secondary | ICD-10-CM | POA: Diagnosis not present

## 2020-06-29 DIAGNOSIS — D2272 Melanocytic nevi of left lower limb, including hip: Secondary | ICD-10-CM | POA: Diagnosis not present

## 2020-06-29 DIAGNOSIS — M9903 Segmental and somatic dysfunction of lumbar region: Secondary | ICD-10-CM | POA: Diagnosis not present

## 2020-06-29 DIAGNOSIS — L821 Other seborrheic keratosis: Secondary | ICD-10-CM | POA: Diagnosis not present

## 2020-06-29 DIAGNOSIS — D2271 Melanocytic nevi of right lower limb, including hip: Secondary | ICD-10-CM | POA: Diagnosis not present

## 2020-06-29 DIAGNOSIS — D225 Melanocytic nevi of trunk: Secondary | ICD-10-CM | POA: Diagnosis not present

## 2020-06-29 DIAGNOSIS — M50122 Cervical disc disorder at C5-C6 level with radiculopathy: Secondary | ICD-10-CM | POA: Diagnosis not present

## 2020-06-29 DIAGNOSIS — L814 Other melanin hyperpigmentation: Secondary | ICD-10-CM | POA: Diagnosis not present

## 2020-06-29 DIAGNOSIS — D1801 Hemangioma of skin and subcutaneous tissue: Secondary | ICD-10-CM | POA: Diagnosis not present

## 2020-06-29 DIAGNOSIS — M9901 Segmental and somatic dysfunction of cervical region: Secondary | ICD-10-CM | POA: Diagnosis not present

## 2020-06-29 DIAGNOSIS — L718 Other rosacea: Secondary | ICD-10-CM | POA: Diagnosis not present

## 2020-06-29 DIAGNOSIS — M5137 Other intervertebral disc degeneration, lumbosacral region: Secondary | ICD-10-CM | POA: Diagnosis not present

## 2020-06-30 ENCOUNTER — Encounter: Payer: Self-pay | Admitting: Internal Medicine

## 2020-07-05 DIAGNOSIS — M5137 Other intervertebral disc degeneration, lumbosacral region: Secondary | ICD-10-CM | POA: Diagnosis not present

## 2020-07-05 DIAGNOSIS — M9901 Segmental and somatic dysfunction of cervical region: Secondary | ICD-10-CM | POA: Diagnosis not present

## 2020-07-05 DIAGNOSIS — M50122 Cervical disc disorder at C5-C6 level with radiculopathy: Secondary | ICD-10-CM | POA: Diagnosis not present

## 2020-07-05 DIAGNOSIS — M9903 Segmental and somatic dysfunction of lumbar region: Secondary | ICD-10-CM | POA: Diagnosis not present

## 2020-07-06 NOTE — Progress Notes (Signed)
GYNECOLOGY  VISIT  PCP: Billey Gosling, MD   HPI: 70 y.o.   Married  Caucasian  female   G2P2002 with Patient's last menstrual period was 06/17/2011.   here for breast and pelvic exam.   Patient is followed for osteopenia, FH breast cancer, HSV, hypothyroidism, and vaginal atrophy.  Rare use of Valtrex for fever blisters.   No painful intercourse. Not using vaginal estradiol 10 mcg due to facial breakouts.   Off Citalopram and doing well.   Going to [place] in August. Going to [place] this month.   GYNECOLOGIC HISTORY: Patient's last menstrual period was 06/17/2011. Contraception:  PMP Menopausal hormone therapy: Estradiol Vag.tablets Last mammogram: 4-7-213D/saline implants/Neg/BiRads1-- knows to schedule Last pap smear: 04-30-18 Neg, 03-16-16 Neg:Neg HR HPV, 01-20-13 Neg:Neg HR HPV        OB History    Gravida  2   Para  2   Term  2   Preterm      AB      Living  2     SAB      IAB      Ectopic      Multiple      Live Births                 Patient Active Problem List   Diagnosis Date Noted  . Acute sinus infection 12/11/2018  . Generalized abdominal pain 11/19/2018  . Arthralgia of left temporomandibular joint 12/05/2017  . Food sensitivity with gastrointestinal symptoms 03/27/2017  . GERD without esophagitis 03/27/2017  . Elevated troponin 02/17/2016  . Internal hemorrhoids 02/17/2016  . Anxiety and depression 01/28/2016  . 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 Hypothyroidism 11/30/2009  . Vitamin D deficiency 11/30/2009  . Allergic rhinitis 11/30/2009  . Osteopenia 11/30/2009    Past Medical History:  Diagnosis Date  . Anxiety   . Depression   . GERD (gastroesophageal reflux disease)    occasional   . HSV-1 infection   . Hypothyroidism   . Osteopenia   . Perennial allergic rhinitis   . 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  Marland Kitchen Vitamin D deficiency     Past Surgical History:  Procedure Laterality Date  . APPENDECTOMY  2011   . AUGMENTATION MAMMAPLASTY    . BREAST ENHANCEMENT SURGERY  1996  . COLONOSCOPY  2000 , 2007   Dr Olevia Perches. Due 2017  . NECK LIFT  11/2015  . WISDOM TOOTH EXTRACTION      Current Outpatient Medications  Medication Sig Dispense Refill  . cefdinir (OMNICEF) 300 MG capsule Take 1 capsule (300 mg total) by mouth 2 (two) times daily. 20 capsule 0  . Cholecalciferol (VITAMIN D3) 2000 units TABS Take 4,000 Units by mouth daily.     . ciprofloxacin (CIPRO) 500 MG tablet Take 500 mg by mouth 2 (two) times daily.    . citalopram (CELEXA) 10 MG tablet Take 1 tablet (10 mg total) by mouth daily. 90 tablet 3  . doxycycline (MONODOX) 100 MG capsule Take 100 mg by mouth 2 (two) times daily.    . Estradiol 10 MCG TABS vaginal tablet Place one tablet (10 mcg) in the vagina twice weekly. 24 tablet 3  . ipratropium (ATROVENT) 0.06 % nasal spray INSTILL TWO SPRAYS INTO EACH NOSTRIL THREE TIMES A DAY 15 mL 8  . levothyroxine (SYNTHROID) 75 MCG tablet TAKE 1 TABLET(75 MCG) BY MOUTH DAILY 90 tablet 2  . metroNIDAZOLE (METROCREAM) 0.75 % cream Apply 1 application topically as  needed.    . Multiple Vitamins-Minerals (OSTEOPRIME ULTRA) TABS as directed.     . Probiotic Product (PROBIOTIC DAILY PO) Take by mouth daily.     . traZODone (DESYREL) 150 MG tablet SMARTSIG:1/3-2 Tablet(s) By Mouth Every Evening PRN    . valACYclovir (VALTREX) 1000 MG tablet Take 2 tablets (2000 mg) by mouth twice a day for 1 day. (Patient taking differently: as needed. Take 2 tablets (2000 mg) by mouth twice a day for 1 day.) 30 tablet 0   No current facility-administered medications for this visit.     ALLERGIES: Other and Augmentin [amoxicillin-pot clavulanate]  Family History  Problem Relation Age of Onset  . Stroke Father 19  . Colon polyps Father   . Other Brother 45       OS muscle paralysis , resolution over 3 days. Neg evaluation  . Breast cancer Mother 40  . Thyroid disease Mother   . Dementia Mother   . Colon cancer  Paternal Grandfather   . Coronary artery disease Maternal Grandmother 63  . Colon cancer Paternal Grandmother   . Coronary artery disease Maternal Grandfather 60  . Diabetes Neg Hx   . Esophageal cancer Neg Hx   . Rectal cancer Neg Hx   . Stomach cancer Neg Hx     Social History   Socioeconomic History  . Marital status: Married    Spouse name: Not on file  . Number of children: 2  . Years of education: Not on file  . Highest education level: Not on file  Occupational History  . Occupation: retired  Tobacco Use  . Smoking status: Former Smoker    Types: Cigarettes  . Smokeless tobacco: Never Used  . Tobacco comment: only smoked age [age] , 1 ppweek  Vaping Use  . Vaping Use: Never used  Substance and Sexual Activity  . Alcohol use: Yes    Alcohol/week: 2.0 - 4.0 standard drinks    Types: 2 - 4 Standard drinks or equivalent per week  . Drug use: No  . Sexual activity: Yes    Partners: Male    Birth control/protection: Post-menopausal  Other Topics Concern  . Not on file  Social History Narrative   Exercise: weights, walking, pilates, swimming   Social Determinants of Health   Financial Resource Strain: Not on file  Food Insecurity: Not on file  Transportation Needs: Not on file  Physical Activity: Not on file  Stress: Not on file  Social Connections: Not on file  Intimate Partner Violence: Not on file    Review of Systems  All other systems reviewed and are negative.   PHYSICAL EXAMINATION:    BP 110/64   Pulse 67   Ht 5' 5.75" (1.67 m)   Wt 122 lb (55.3 kg)   LMP 06/17/2011   SpO2 99%   BMI 19.84 kg/m     General appearance: alert, cooperative and appears stated age Head: Normocephalic, without obvious abnormality, atraumatic Neck: no adenopathy, supple, symmetrical, trachea midline and thyroid normal to inspection and palpation Lungs: clear to auscultation bilaterally Breasts: normal appearance, bilateral implants, no masses or tenderness, No  nipple retraction or dimpling, No nipple discharge or bleeding, No axillary or supraclavicular adenopathy Heart: regular rate and rhythm Abdomen: soft, non-tender, no masses,  no organomegaly Extremities: extremities normal, atraumatic, no cyanosis or edema Skin: Skin color, texture, turgor normal. No rashes or lesions Lymph nodes: Cervical, supraclavicular, and axillary nodes normal. No abnormal inguinal nodes palpated Neurologic: Grossly normal  Pelvic:  External genitalia:  no lesions              Urethra:  normal appearing urethra with no masses, tenderness or lesions              Bartholins and Skenes: normal                 Vagina: normal appearing vagina with normal color and discharge, atrophy changes noted.               Cervix: no lesions. Pap collected.                Bimanual Exam:  Uterus:  normal size, contour, position, consistency, mobility, non-tender              Adnexa: no mass, fullness, tenderness              Rectal exam: Yes.  .  Confirms.              Anus:  normal sphincter tone, no lesions  Chaperone was present for exam.  ASSESSMENT  Bilateral breast implants.  Screening breast exam.  FH of breast cancerin mother in her 68s. Pelvic exam with abnormal finding present.   Atrophy of vagina. 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 Valtrex prn. Hypothyroidism. Osteopenia.   PLAN  Patient will schedule mammogram.  She may use the vagifem 10 mcg and split it in half and use 5 mcg per vagina twice weekly if desired. I discussed potential effect of vaginal estrogen on breast cancer.  Self breast exam discussed.  Pap and reflex HR HPV.   Refill Valtrex.  Check TSH and free T4.  Refill of Synthroid 75 mcg.  BMD due next year.  Calcium 1200 mg total per day and at least 600 - 800 IU vit D daily.  Weight bearing exercise reviewed.    31 min  total time was spent for this patient encounter, including preparation, face-to-face counseling with the patient,  coordination of care, and documentation of the encounter.

## 2020-07-07 ENCOUNTER — Encounter: Payer: Self-pay | Admitting: Internal Medicine

## 2020-07-07 ENCOUNTER — Telehealth: Payer: Self-pay | Admitting: Internal Medicine

## 2020-07-07 ENCOUNTER — Other Ambulatory Visit (HOSPITAL_COMMUNITY)
Admission: RE | Admit: 2020-07-07 | Discharge: 2020-07-07 | Disposition: A | Discharge status: Home / Self Care | Payer: Medicare Other | Source: Ambulatory Visit | Attending: Obstetrics and Gynecology | Admitting: Obstetrics and Gynecology

## 2020-07-07 ENCOUNTER — Other Ambulatory Visit: Payer: Self-pay

## 2020-07-07 ENCOUNTER — Encounter: Payer: Self-pay | Admitting: Obstetrics and Gynecology

## 2020-07-07 ENCOUNTER — Ambulatory Visit (INDEPENDENT_AMBULATORY_CARE_PROVIDER_SITE_OTHER): Payer: Medicare Other | Admitting: Obstetrics and Gynecology

## 2020-07-07 VITALS — BP 110/64 | HR 67 | Ht 65.75 in | Wt 122.0 lb

## 2020-07-07 DIAGNOSIS — Z1239 Encounter for other screening for malignant neoplasm of breast: Secondary | ICD-10-CM

## 2020-07-07 DIAGNOSIS — Z01411 Encounter for gynecological examination (general) (routine) with abnormal findings: Secondary | ICD-10-CM | POA: Diagnosis not present

## 2020-07-07 DIAGNOSIS — Z9189 Other specified personal risk factors, not elsewhere classified: Secondary | ICD-10-CM

## 2020-07-07 DIAGNOSIS — Z124 Encounter for screening for malignant neoplasm of cervix: Secondary | ICD-10-CM

## 2020-07-07 DIAGNOSIS — B009 Herpesviral infection, unspecified: Secondary | ICD-10-CM | POA: Diagnosis not present

## 2020-07-07 DIAGNOSIS — N952 Postmenopausal atrophic vaginitis: Secondary | ICD-10-CM | POA: Diagnosis not present

## 2020-07-07 DIAGNOSIS — E039 Hypothyroidism, unspecified: Secondary | ICD-10-CM | POA: Diagnosis not present

## 2020-07-07 LAB — T4, FREE: Free T4: 1.8 ng/dL (ref 0.8–1.8)

## 2020-07-07 LAB — TSH: TSH: 2.38 mIU/L (ref 0.40–4.50)

## 2020-07-07 MED ORDER — LEVOTHYROXINE SODIUM 75 MCG PO TABS: ORAL_TABLET | ORAL | 11 refills | Status: DC

## 2020-07-07 MED ORDER — VALACYCLOVIR HCL 1 G PO TABS: ORAL_TABLET | ORAL | 1 refills | Status: DC

## 2020-07-07 NOTE — Patient Instructions (Signed)

## 2020-07-07 NOTE — Progress Notes (Signed)
  Chronic Care Management   Outreach Note  07/07/2020 Name: JEILYN REZNIK MRN: [id number] DOB: [birth year]  Referred by: Binnie Rail, MD Reason for referral : No chief complaint on file.   An unsuccessful telephone outreach was attempted today. The patient was referred to the pharmacist for assistance with care management and care coordination.   Follow Up Plan:   Carley Perdue [employer] Scheduler

## 2020-07-08 ENCOUNTER — Telehealth: Payer: Self-pay | Admitting: Internal Medicine

## 2020-07-08 NOTE — Progress Notes (Signed)
  Chronic Care Management   Note  07/08/2020 Name: Rachel Knight MRN: [id number] DOB: 12/17/1950  Rachel Knight is a 70 y.o. year old female who is a primary care patient of Burns, Claudina Lick, MD. I reached out to [employer] by phone today in response to a referral sent by Rachel Knight's PCP, Binnie Rail, MD.   Rachel Knight was given information about Chronic Care Management services today including:  1. CCM service includes personalized support from designated clinical staff supervised by her physician, including individualized plan of care and coordination with other care providers 2. 24/7 contact phone numbers for assistance for urgent and routine care needs. 3. Service will only be billed when office clinical staff spend 20 minutes or more in a month to coordinate care. 4. Only one practitioner may furnish and bill the service in a calendar month. 5. The patient may stop CCM services at any time (effective at the end of the month) by phone call to the office staff.   Patient did not agree to enrollment in care management services and does not wish to consider at this time.  Follow up plan:   Carley Perdue [employer] Scheduler

## 2020-07-09 LAB — CYTOLOGY - PAP: Diagnosis: NEGATIVE

## 2020-07-12 DIAGNOSIS — Z23 Encounter for immunization: Secondary | ICD-10-CM | POA: Diagnosis not present

## 2020-07-12 DIAGNOSIS — M9901 Segmental and somatic dysfunction of cervical region: Secondary | ICD-10-CM | POA: Diagnosis not present

## 2020-07-12 DIAGNOSIS — M9903 Segmental and somatic dysfunction of lumbar region: Secondary | ICD-10-CM | POA: Diagnosis not present

## 2020-07-12 DIAGNOSIS — M50122 Cervical disc disorder at C5-C6 level with radiculopathy: Secondary | ICD-10-CM | POA: Diagnosis not present

## 2020-07-12 DIAGNOSIS — M5137 Other intervertebral disc degeneration, lumbosacral region: Secondary | ICD-10-CM | POA: Diagnosis not present

## 2020-07-20 DIAGNOSIS — M5137 Other intervertebral disc degeneration, lumbosacral region: Secondary | ICD-10-CM | POA: Diagnosis not present

## 2020-07-20 DIAGNOSIS — M50122 Cervical disc disorder at C5-C6 level with radiculopathy: Secondary | ICD-10-CM | POA: Diagnosis not present

## 2020-07-20 DIAGNOSIS — M9901 Segmental and somatic dysfunction of cervical region: Secondary | ICD-10-CM | POA: Diagnosis not present

## 2020-07-20 DIAGNOSIS — M9903 Segmental and somatic dysfunction of lumbar region: Secondary | ICD-10-CM | POA: Diagnosis not present

## 2020-08-11 DIAGNOSIS — M50122 Cervical disc disorder at C5-C6 level with radiculopathy: Secondary | ICD-10-CM | POA: Diagnosis not present

## 2020-08-11 DIAGNOSIS — M5137 Other intervertebral disc degeneration, lumbosacral region: Secondary | ICD-10-CM | POA: Diagnosis not present

## 2020-08-11 DIAGNOSIS — M9903 Segmental and somatic dysfunction of lumbar region: Secondary | ICD-10-CM | POA: Diagnosis not present

## 2020-08-11 DIAGNOSIS — M9901 Segmental and somatic dysfunction of cervical region: Secondary | ICD-10-CM | POA: Diagnosis not present

## 2020-08-13 ENCOUNTER — Encounter: Payer: Self-pay | Admitting: Internal Medicine

## 2020-08-13 ENCOUNTER — Other Ambulatory Visit: Payer: Self-pay | Admitting: Obstetrics and Gynecology

## 2020-08-13 DIAGNOSIS — E039 Hypothyroidism, unspecified: Secondary | ICD-10-CM

## 2020-08-13 NOTE — Telephone Encounter (Signed)
Pharmacy was requesting 90 day supply on synthroid 75 mcg, not 30 day supply. 90 day supply sent.

## 2020-08-14 MED ORDER — VALACYCLOVIR HCL 1 G PO TABS: ORAL_TABLET | ORAL | 1 refills | Status: DC

## 2020-08-18 ENCOUNTER — Other Ambulatory Visit: Payer: Self-pay | Admitting: Obstetrics and Gynecology

## 2020-08-18 DIAGNOSIS — E039 Hypothyroidism, unspecified: Secondary | ICD-10-CM

## 2020-08-25 DIAGNOSIS — M50122 Cervical disc disorder at C5-C6 level with radiculopathy: Secondary | ICD-10-CM | POA: Diagnosis not present

## 2020-08-25 DIAGNOSIS — M9903 Segmental and somatic dysfunction of lumbar region: Secondary | ICD-10-CM | POA: Diagnosis not present

## 2020-08-25 DIAGNOSIS — M9901 Segmental and somatic dysfunction of cervical region: Secondary | ICD-10-CM | POA: Diagnosis not present

## 2020-08-25 DIAGNOSIS — M5137 Other intervertebral disc degeneration, lumbosacral region: Secondary | ICD-10-CM | POA: Diagnosis not present

## 2020-09-15 ENCOUNTER — Ambulatory Visit: Payer: Medicare Other

## 2020-09-15 ENCOUNTER — Encounter: Payer: Self-pay | Admitting: Adult Health

## 2020-09-15 ENCOUNTER — Telehealth (INDEPENDENT_AMBULATORY_CARE_PROVIDER_SITE_OTHER): Payer: Medicare Other | Admitting: Adult Health

## 2020-09-15 VITALS — Temp 100.0°F | Ht 65.75 in | Wt 122.0 lb

## 2020-09-15 DIAGNOSIS — J40 Bronchitis, not specified as acute or chronic: Secondary | ICD-10-CM

## 2020-09-15 DIAGNOSIS — R059 Cough, unspecified: Secondary | ICD-10-CM

## 2020-09-15 MED ORDER — PREDNISONE 10 MG PO TABS: ORAL_TABLET | ORAL | 0 refills | Status: DC

## 2020-09-15 MED ORDER — BENZONATATE 200 MG PO CAPS: 200.0000 mg | ORAL_CAPSULE | Freq: Three times a day (TID) | ORAL | 1 refills | Status: DC | PRN

## 2020-09-15 NOTE — Progress Notes (Signed)
Virtual Visit via Video Note  I connected with Rachel Knight  on 09/15/20 at 11:00 AM EDT by a video enabled telemedicine application and verified that I am speaking with the correct person using two identifiers.  Location patient: home Location provider:work or home office Persons participating in the virtual visit: patient, provider  I discussed the limitations of evaluation and management by telemedicine and the availability of in person appointments. The patient expressed understanding and agreed to proceed.   HPI:  70 year old female, patient of Dr. Quay Burow who I am seen today for an acute issue.  Her symptoms started 4 days ago.  Symptoms include cough that started off as productive but turned into constant dry cough that is worse at night, low-grade fever up to 100 degrees ( has not had a fever since about 6 am this morning), she has been taking motrin but has not taken any since her fever broke.   He denies wheezing, shortness of breath, body aches, or joint pain  Took a COVID test this morning which was negative.  Was around her grandchildren over July 4 weekend and they were sick with various illnesses.  He does report feeling better since her fever broke   ROS: See pertinent positives and negatives per HPI.  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   Vitamin D deficiency     Past Surgical History:  Procedure Laterality Date   APPEN[place]CTOMY  2011   [place]   COLONOSCOPY  2000 , 2007   Dr Olevia Perches. Due 2017   NECK LIFT  11/2015   WISDOM TOOTH EXTRACTION      Family History  Problem Relation Age of Onset   Stroke Father 52   Colon polyps Father    Other Brother 65       OS muscle paralysis ,  resolution over 3 days. Neg evaluation   Breast cancer Mother 53   Thyroid disease Mother    Dementia Mother    Colon cancer Paternal Grandfather    Coronary artery disease Maternal Grandmother 56   Colon cancer Paternal Grandmother    Coronary artery disease Maternal Grandfather 33   Diabetes Neg Hx    Esophageal cancer Neg Hx    Rectal cancer Neg Hx    Stomach cancer Neg Hx        Current Outpatient Medications:    benzonatate (TESSALON) 200 MG capsule, Take 1 capsule (200 mg total) by mouth 3 (three) times daily as needed for cough., Disp: 30 capsule, Rfl: 1   Cholecalciferol (VITAMIN D3) 2000 units TABS, Take 4,000 Units by mouth daily. , Disp: , Rfl:    citalopram (CELEXA) 10 MG tablet, Take 1 tablet (10 mg total) by mouth daily., Disp: 90 tablet, Rfl: 3   Estradiol 10 MCG TABS vaginal tablet, Place one tablet (10 mcg) in the vagina twice weekly., Disp: 24 tablet, Rfl: 3   ipratropium (ATROVENT) 0.06 % nasal spray, INSTILL TWO SPRAYS INTO EACH NOSTRIL THREE TIMES A DAY, Disp: 15 mL, Rfl: 8   levothyroxine (SYNTHROID) 75 MCG tablet, TAKE 1 TABLET(75 MCG) BY MOUTH DAILY, Disp: 90 tablet, Rfl: 3   metroNIDAZOLE (METROCREAM) 0.75 %  cream, Apply 1 application topically as needed., Disp: , Rfl:    Multiple Vitamins-Minerals (OSTEOPRIME ULTRA) TABS, as directed. , Disp: , Rfl:    predniSONE (DELTASONE) 10 MG tablet, 40 mg x 3 days, 20 mg x 3 days, 10 mg x 3 days, Disp: 21 tablet, Rfl: 0   Probiotic Product (PROBIOTIC DAILY PO), Take by mouth daily. , Disp: , Rfl:    traZODone (DESYREL) 150 MG tablet, SMARTSIG:1/3-2 Tablet(s) By Mouth Every Evening PRN, Disp: , Rfl:    valACYclovir (VALTREX) 1000 MG tablet, Take 2 tablets (2000 mg) by mouth twice a day for 1 day., Disp: 30 tablet, Rfl: 1  EXAM:  VITALS per patient if applicable:  GENERAL: alert, oriented, appears well and in no acute distress  HEENT: atraumatic, conjunttiva clear, no obvious abnormalities on inspection of external  nose and ears  NECK: normal movements of the head and neck  LUNGS: on inspection no signs of respiratory distress, breathing rate appears normal, no obvious gross SOB, gasping or wheezing. Dry hacking cough noted during exam   CV: no obvious cyanosis  MS: moves all visible extremities without noticeable abnormality  PSYCH/NEURO: pleasant and cooperative, no obvious depression or anxiety, speech and thought processing grossly intact  ASSESSMENT AND PLAN:  Discussed the following assessment and plan:  No diagnosis found.  1. Bronchitis -We will treat for possible bronchitis.  Follow-up PCP if symptoms do not resolve or if symptoms worsen. - predniSONE (DELTASONE) 10 MG tablet; 40 mg x 3 days, 20 mg x 3 days, 10 mg x 3 days  Dispense: 21 tablet; Refill: 0  2. Cough  - predniSONE (DELTASONE) 10 MG tablet; 40 mg x 3 days, 20 mg x 3 days, 10 mg x 3 days  Dispense: 21 tablet; Refill: 0 - benzonatate (TESSALON) 200 MG capsule; Take 1 capsule (200 mg total) by mouth 3 (three) times daily as needed for cough.  Dispense: 30 capsule; Refill: 1     I discussed the assessment and treatment plan with the patient. The patient was provided an opportunity to ask questions and all were answered. The patient agreed with the plan and demonstrated an understanding of the instructions.   The patient was advised to call back or seek an in-person evaluation if the symptoms worsen or if the condition fails to improve as anticipated.   Dorothyann Peng, NP

## 2020-09-17 ENCOUNTER — Encounter: Payer: Self-pay | Admitting: Adult Health

## 2020-09-17 ENCOUNTER — Other Ambulatory Visit: Payer: Self-pay

## 2020-09-17 MED ORDER — DOXYCYCLINE HYCLATE 100 MG PO TABS: 100.0000 mg | ORAL_TABLET | Freq: Two times a day (BID) | ORAL | 0 refills | Status: DC

## 2020-09-18 ENCOUNTER — Encounter: Payer: Self-pay | Admitting: Internal Medicine

## 2020-09-18 ENCOUNTER — Other Ambulatory Visit: Payer: Self-pay

## 2020-09-18 ENCOUNTER — Encounter (HOSPITAL_COMMUNITY): Payer: Self-pay

## 2020-09-18 ENCOUNTER — Ambulatory Visit (HOSPITAL_COMMUNITY)
Admission: RE | Admit: 2020-09-18 | Discharge: 2020-09-18 | Disposition: A | Discharge status: Home / Self Care | Payer: Medicare Other | Source: Ambulatory Visit | Attending: Physician Assistant | Admitting: Physician Assistant

## 2020-09-18 VITALS — BP 113/54 | HR 78 | Temp 98.7°F | Resp 16

## 2020-09-18 DIAGNOSIS — H66001 Acute suppurative otitis media without spontaneous rupture of ear drum, right ear: Secondary | ICD-10-CM | POA: Diagnosis not present

## 2020-09-18 DIAGNOSIS — R059 Cough, unspecified: Secondary | ICD-10-CM

## 2020-09-18 DIAGNOSIS — J329 Chronic sinusitis, unspecified: Secondary | ICD-10-CM | POA: Diagnosis not present

## 2020-09-18 DIAGNOSIS — J40 Bronchitis, not specified as acute or chronic: Secondary | ICD-10-CM

## 2020-09-18 MED ORDER — CEFDINIR 300 MG PO CAPS: 300.0000 mg | ORAL_CAPSULE | Freq: Two times a day (BID) | ORAL | 0 refills | Status: DC

## 2020-09-18 MED ORDER — PROMETHAZINE-DM 6.25-15 MG/5ML PO SYRP: 5.0000 mL | ORAL_SOLUTION | Freq: Two times a day (BID) | ORAL | 0 refills | Status: DC | PRN

## 2020-09-18 NOTE — Discharge Instructions (Addendum)
Take Omnicef to twice daily to cover for sinus infection and ear infection.  Use Promethazine DM for cough.  You can use this twice a day but this will make you sleepy do not drive or drink alcohol while taking it.  Continue over the counter medication including Mucinex, Flonase, Tylenol, ibuprofen for symptom relief.  Make sure you are drinking plenty of fluid.  If you have any worsening symptoms or symptoms not improved within 24 to 48 hours after starting antibiotics please return for reevaluation.  Follow-up with your primary care provider within 1 week.

## 2020-09-18 NOTE — ED Provider Notes (Signed)
[place]    CSN: [id number] Arrival date & time: 09/18/20  1445      History   Chief Complaint Chief Complaint  Patient presents with   Appointment    HPI Rachel Knight is a 70 y.o. female.   Patient presents today with a 1 week history of URI symptoms.  Reports that she had been around her sick grandkids prior to symptom onset.  Currently she is experiencing cough, right otalgia, congestion, fatigue, malaise.  She reports the cough initially started as dry and is now productive.  She has tried multiple over-the-counter medications without improvement of symptoms.  She had a video visit with her primary care provider and was prescribed prednisone and Tessalon which has provided no relief of symptoms.  She denies any recent antibiotic use; was given amoxicillin approximately 3 months ago for URI.  She denies history of asthma, allergies, smoking, COPD.  Reports she is up-to-date on COVID-19 vaccination and flu shot.  She has had on COVID-19 boosters.  She is concerned that symptoms continually worsen despite a week of conservative management.   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 Due 2017   NECK LIFT  11/2015   WISDOM TOOTH EXTRACTION      OB History     Gravida  2   Para  2   Term  2   Preterm      AB      Living  2      SAB      IAB      Ectopic      Multiple      Live Births               Home Medications    Prior to Admission medications   Medication Sig Start Date End Date Taking? Authorizing Provider  cefdinir (OMNICEF) 300 MG capsule Take 1 capsule (300 mg total) by mouth 2 (two) times daily. 09/18/20  Yes Verity Gilcrest K, PA-C  promethazine-dextromethorphan (PROMETHAZINE-DM) 6.25-15 MG/5ML syrup Take 5 mLs by mouth 2 (two) times daily as needed for cough. 09/18/20  Yes Kaleab Frasier K, PA-C  benzonatate (TESSALON) 200 MG capsule Take 1 capsule (200 mg total) by mouth 3 (three) times daily as needed for cough. 09/15/20   Nafziger, Tommi Rumps, NP  Cholecalciferol (VITAMIN D3) 2000 units TABS Take 4,000 Units by mouth daily.     [provider]  citalopram (CELEXA)  10 MG tablet Take 1 tablet (10 mg total) by mouth daily. 01/07/20   Binnie Rail, MD  doxycycline (VIBRA-TABS) 100 MG tablet Take 1 tablet (100 mg total) by mouth 2 (two) times daily. For 7 days. 09/17/20   Nafziger, Tommi Rumps, NP  Estradiol 10 MCG TABS vaginal tablet Place one tablet (10 mcg) in the vagina twice weekly. 05/07/19   Yisroel Ramming, Brook E, MD  ipratropium (ATROVENT) 0.06 % nasal spray INSTILL TWO SPRAYS INTO EACH NOSTRIL THREE TIMES A DAY 02/17/20   Binnie Rail, MD  levothyroxine (SYNTHROID) 75 MCG tablet TAKE 1 TABLET(75 MCG) BY MOUTH DAILY 08/13/20   Yisroel Ramming, Everardo All, MD  metroNIDAZOLE (METROCREAM) 0.75 % cream Apply 1 application topically as needed. 04/16/19   [provider]  Multiple Vitamins-Minerals (OSTEOPRIME ULTRA) TABS as directed.  03/28/17   [provider]  predniSONE (DELTASONE) 10 MG tablet 40 mg x 3 days, 20 mg x 3 days, 10 mg x 3 days 09/15/20   Dorothyann Peng, NP  Probiotic Product (PROBIOTIC DAILY PO) Take by mouth daily.     [provider]  traZODone (DESYREL) 150 MG tablet SMARTSIG:1/3-2 Tablet(s) By Mouth Every Evening PRN 06/29/20   [provider]  valACYclovir (VALTREX) 1000 MG tablet Take 2 tablets (2000 mg) by mouth twice a day for 1 day. 08/14/20   Binnie Rail, MD    Family History Family History  Problem Relation Age of Onset   Stroke Father 2   Colon polyps Father    Other Brother 5       OS muscle paralysis , resolution over 3 days. Neg evaluation   Breast cancer Mother 84   Thyroid disease Mother    Dementia Mother    Colon cancer Paternal Grandfather    Coronary artery disease Maternal Grandmother 37   Colon cancer Paternal Grandmother    Coronary artery disease Maternal Grandfather 36   Diabetes Neg Hx    Esophageal cancer Neg Hx    Rectal cancer Neg Hx    Stomach cancer Neg Hx     Social History Social History   Tobacco Use   Smoking status: Former    Types: Cigarettes   Smokeless tobacco: Never   Tobacco comments:    only smoked age [age] , 1 ppweek  Vaping Use   Vaping Use: Never used  Substance Use Topics   Alcohol use: Yes    Alcohol/week: 2.0 - 4.0 standard drinks    Types: 2 - 4 Standard drinks or equivalent per week   Drug use: No     Allergies   Other and Augmentin [amoxicillin-pot clavulanate]   Review of Systems Review of Systems  Constitutional:  Positive for activity change and fatigue. Negative for appetite change and fever (resolved).  HENT:  Positive for congestion. Negative for sinus pressure, sneezing and sore throat.   Respiratory:  Positive for cough. Negative for shortness of breath.   Cardiovascular:  Negative for chest pain.  Gastrointestinal:  Negative for abdominal pain, diarrhea, nausea and vomiting.  Musculoskeletal:  Negative  for arthralgias and myalgias.  Neurological:  Positive for headaches. Negative for dizziness and light-headedness.    Physical Exam Triage Vital Signs ED Triage Vitals  Enc Vitals Group     BP 09/18/20 1505 (!) 113/54     Pulse Rate 09/18/20 1505 78     Resp 09/18/20 1505 16     Temp 09/18/20 1505 98.7 F (37.1 C)  Temp Source 09/18/20 1505 Oral     SpO2 09/18/20 1505 98 %     Weight --      Height --      Head Circumference --      Peak Flow --      Pain Score 09/18/20 1503 0     Pain Loc --      Pain Edu? --      Excl. in [place]? --    No data found.  Updated Vital Signs BP (!) 113/54 (BP Location: Right Arm)   Pulse 78   Temp 98.7 F (37.1 C) (Oral)   Resp 16   LMP 06/17/2011   SpO2 98%   Visual Acuity Right Eye Distance:   Left Eye Distance:   Bilateral Distance:    Right Eye Near:   Left Eye Near:    Bilateral Near:     Physical Exam Vitals reviewed.  Constitutional:      General: She is awake. She is not in acute distress.    Appearance: Normal appearance. She is normal weight. She is not ill-appearing.     Comments: Very pleasant female appears stated age no acute distress sitting comfortably in exam room  HENT:     Head: Normocephalic and atraumatic.     Right Ear: Ear canal and external ear normal. Tympanic membrane is erythematous and bulging.     Left Ear: Tympanic membrane, ear canal and external ear normal. Tympanic membrane is not erythematous or bulging.     Nose:     Right Sinus: No maxillary sinus tenderness or frontal sinus tenderness.     Left Sinus: No maxillary sinus tenderness or frontal sinus tenderness.     Mouth/Throat:     Pharynx: Uvula midline. Posterior oropharyngeal erythema present. No oropharyngeal exudate.     Comments: Drainage in posterior oropharynx Cardiovascular:     Rate and Rhythm: Normal rate and regular rhythm.     Heart sounds: Normal heart sounds, S1 normal and S2 normal. No murmur heard. Pulmonary:     Effort:  Pulmonary effort is normal.     Breath sounds: Normal breath sounds. No wheezing, rhonchi or rales.     Comments: Clear to auscultation bilaterally Lymphadenopathy:     Head:     Right side of head: No submental, submandibular or tonsillar adenopathy.     Left side of head: No submental, submandibular or tonsillar adenopathy.     Cervical: No cervical adenopathy.  Psychiatric:        Behavior: Behavior is cooperative.     UC Treatments / Results  Labs (all labs ordered are listed, but only abnormal results are displayed) Labs Reviewed - No data to display  EKG   Radiology No results found.  Procedures Procedures (including critical care time)  Medications Ordered in UC Medications - No data to display  Initial Impression / Assessment and Plan / UC Course  I have reviewed the triage vital signs and the nursing notes.  Pertinent labs & imaging results that were available during my care of the patient were reviewed by me and considered in my medical decision making (see chart for details).      No indication for viral testing given patient has been symptomatic for more than a week.  Lung sounds and oxygen saturation were normal so chest x-ray was not obtained.  Otitis media and sinobronchitis noted on exam patient was started on Omnicef as she has had difficulty tolerating Augmentin in the past.  She was prescribed Promethazine DM for cough symptoms with instruction not to drive or drink alcohol with this medication as drowsiness is a common side effect.  Recommend she use over-the-counter medications including Mucinex, over-the-counter analgesics, Flonase for symptom relief.  Encouraged her to rest and drink plenty of fluid until symptoms resolve.  Discussed alarm symptoms that warrant emergent evaluation.  Strict return precautions given to which patient expressed understanding.  Final Clinical Impressions(s) / UC Diagnoses   Final diagnoses:  Sinobronchitis  Cough   Non-recurrent acute suppurative otitis media of right ear without spontaneous rupture of tympanic membrane     Discharge Instructions      Take Omnicef to twice daily to cover for sinus infection and ear infection.  Use Promethazine DM for cough.  You can use this twice a day but this will make you sleepy do not drive or drink alcohol while taking it.  Continue over the counter medication including Mucinex, Flonase, Tylenol, ibuprofen for symptom relief.  Make sure you are drinking plenty of fluid.  If you have any worsening symptoms or symptoms not improved within 24 to 48 hours after starting antibiotics please return for reevaluation.  Follow-up with your primary care provider within 1 week.     ED Prescriptions     Medication Sig Dispense Auth. Provider   cefdinir (OMNICEF) 300 MG capsule Take 1 capsule (300 mg total) by mouth 2 (two) times daily. 20 capsule Mireya Meditz K, PA-C   promethazine-dextromethorphan (PROMETHAZINE-DM) 6.25-15 MG/5ML syrup Take 5 mLs by mouth 2 (two) times daily as needed for cough. 118 mL Melbert Botelho K, PA-C      PDMP not reviewed this encounter.   Terrilee Croak, PA-C 09/18/20 1553

## 2020-09-18 NOTE — ED Triage Notes (Signed)
Pt presents with cough, lack of sleep, ear pain, coughing blood and green mucus.

## 2020-09-28 ENCOUNTER — Encounter: Payer: Self-pay | Admitting: Internal Medicine

## 2020-09-29 ENCOUNTER — Telehealth: Payer: Self-pay

## 2020-09-29 ENCOUNTER — Ambulatory Visit (INDEPENDENT_AMBULATORY_CARE_PROVIDER_SITE_OTHER): Payer: Self-pay

## 2020-09-29 ENCOUNTER — Other Ambulatory Visit: Payer: Self-pay

## 2020-09-29 DIAGNOSIS — Z23 Encounter for immunization: Secondary | ICD-10-CM

## 2020-09-29 DIAGNOSIS — Z1231 Encounter for screening mammogram for malignant neoplasm of breast: Secondary | ICD-10-CM | POA: Diagnosis not present

## 2020-09-29 LAB — HM MAMMOGRAPHY

## 2020-09-29 NOTE — Telephone Encounter (Signed)
pt has stated she just finish her abx for what she was dx'd with x10 days. Pt states she has been tx'd for bronchitis and an ear infection. Pt states she does feel any better and feels as if she is getting worse. Pt now has nasal congestion and very sore throat and what looks like to be white spots.  **Pt would like further advise on what to do as she would like to come into the office. Pt tested neg for COVID with her at home test.

## 2020-09-29 NOTE — Progress Notes (Signed)
Pt states she does not need Hep B series.  Pt informed that Medicare will not cover Hep A for travel. ABN form explained & signed.

## 2020-09-30 ENCOUNTER — Ambulatory Visit: Payer: Self-pay

## 2020-09-30 ENCOUNTER — Ambulatory Visit (HOSPITAL_COMMUNITY): Payer: Medicare Other

## 2020-09-30 NOTE — Progress Notes (Signed)
Subjective:    Patient ID: Rachel Knight, female    DOB: 1950/07/14, 70 y.o.   MRN: [id number]  HPI She is here for an acute visit for cold symptoms.  Her symptoms started almost 3 weeks ago.  It started 7/10 - did a phone visit - put on prednisone, tessalon.  Her symptoms at that time sounded viral.  Felt worse and went to  urgent care and diagnosed with bronchitis and ear infection- started on cough syrup, omnicef.  Her cough improved.  She still felt bad.  She felt like the infection just morphed.  Her ear hurt - thinks it was pressure.  She is having intermittent very sore throat and was unsure if she had white spots on the back of her throat or not.  She had a cough and was still bringing up some green sputum.  She just felt exhausted and overall not well.  She has been sleeping well.  This morning she woke up and she did not feel well, but as the day progressed she has been feeling better and today she feels better than she has felt.   She has taken advil, dayuil, nyquil.    Her covid tests are negative - 4 home tests and 1 at [facility]. .      Medications and allergies reviewed with patient and updated if appropriate.  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      citalopram (CELEXA) 10 MG tablet Take 1 tablet (10 mg  total) by mouth daily. (Patient taking differently: Take 5 mg by mouth daily.) 90 tablet 3   doxycycline (VIBRA-TABS) 100 MG tablet Take 1 tablet (100 mg total) by mouth 2 (two) times daily. For 7 days. 14 tablet 0   Estradiol 10 MCG TABS vaginal tablet Place one tablet (10 mcg) in the vagina twice weekly. 24 tablet 3   ipratropium (ATROVENT) 0.06 % nasal spray INSTILL TWO SPRAYS INTO EACH NOSTRIL THREE TIMES A DAY 15 mL 8   levothyroxine (SYNTHROID) 75 MCG tablet TAKE 1 TABLET(75 MCG) BY MOUTH DAILY 90 tablet 3   metroNIDAZOLE (METROCREAM) 0.75 % cream Apply 1 application topically as needed.     Multiple Vitamins-Minerals (OSTEOPRIME ULTRA) TABS as directed.      Probiotic Product (PROBIOTIC DAILY PO) Take by mouth daily.      traZODone (DESYREL) 150 MG tablet SMARTSIG:1/3-2 Tablet(s) By Mouth Every Evening PRN     valACYclovir (VALTREX) 1000 MG tablet Take 2 tablets (2000 mg) by mouth twice a day for 1 day. 30 tablet 1   No current facility-administered medications on file prior to visit.    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   Vitamin D deficiency     Past Surgical History:  Procedure Laterality Date   APPENDECTOMY  2011   [facility]   COLONOSCOPY  2000 , 2007   Dr Olevia Perches. Due 2017   NECK LIFT  11/2015   WISDOM TOOTH EXTRACTION      Social History   Socioeconomic History   Marital status: Married    Spouse name: Not on file   Number of children: 2   Years of education: Not on file   Highest education level: Not on file  Occupational History   Occupation: retired  Tobacco Use   Smoking status: Former    Types: Cigarettes   Smokeless tobacco: Never   Tobacco comments:    only smoked age  [age] , 1 ppweek  Vaping Use   Vaping Use: Never used  Substance and Sexual Activity   Alcohol use: Yes    Alcohol/week: 2.0 - 4.0 standard drinks    Types: 2 - 4 Standard drinks or equivalent per week   Drug use: No   Sexual activity: Yes    Partners: Male    Birth control/protection: Post-menopausal  Other Topics Concern   Not on file  Social History Narrative   Exercise: weights, walking, pilates, swimming   Social Determinants of Health   Financial Resource Strain: Not on file  Food Insecurity: Not on file  Transportation Needs: Not on file  Physical Activity: Not on file  Stress: Not on file  Social Connections: Not on file    Family History  Problem Relation Age of Onset   Stroke Father 29   Colon polyps Father    Other Brother 81       OS muscle paralysis , resolution over 3 days. Neg evaluation   Breast cancer Mother 34   Thyroid disease Mother    Dementia Mother    Colon cancer Paternal Grandfather    Coronary artery disease Maternal Grandmother 81   Colon cancer Paternal Grandmother    Coronary artery disease Maternal Grandfather 2   Diabetes Neg Hx    Esophageal cancer Neg Hx    Rectal cancer Neg Hx    Stomach cancer Neg Hx     Review of Systems  Constitutional:  Negative for chills and fever.  HENT:  Positive for ear pain (pressure), postnasal drip and sore throat. Negative for congestion and sinus pain.   Respiratory:  Positive for cough (with green sputum) and chest tightness (this morning). Negative for shortness of breath and wheezing.   Neurological:  Negative for headaches.      Objective:   Vitals:   10/01/20 1614  BP: 106/74  Pulse: 92  Temp: 98.9 F (37.2 C)  SpO2: 98%   Filed Weights   10/01/20 1614  Weight: 120 lb 6.4 oz (54.6 kg)   Body mass index is 19.58 kg/m.  Wt Readings from Last 3 Encounters:  10/01/20 120 lb 6.4 oz (54.6 kg)  09/15/20 122 lb (55.3 kg)  07/07/20 122 lb (55.3 kg)     Physical Exam GENERAL  APPEARANCE: Appears stated age, well appearing, NAD EYES: conjunctiva clear, no icterus HEENT: bilateral tympanic membranes and ear canals normal, oropharynx with no erythema, no thyromegaly, trachea midline, no cervical or supraclavicular lymphadenopathy LUNGS: Clear to auscultation without wheeze or crackles, unlabored breathing, good air entry bilaterally CARDIOVASCULAR: Normal S1,S2  without murmurs, no edema SKIN: warm, dry        Assessment & Plan:   See Problem List for Assessment and Plan of chronic medical problems.

## 2020-10-01 ENCOUNTER — Encounter: Payer: Self-pay | Admitting: Internal Medicine

## 2020-10-01 ENCOUNTER — Ambulatory Visit (INDEPENDENT_AMBULATORY_CARE_PROVIDER_SITE_OTHER): Payer: Medicare Other | Admitting: Internal Medicine

## 2020-10-01 ENCOUNTER — Ambulatory Visit: Payer: Medicare Other | Admitting: Internal Medicine

## 2020-10-01 ENCOUNTER — Other Ambulatory Visit: Payer: Self-pay

## 2020-10-01 DIAGNOSIS — J40 Bronchitis, not specified as acute or chronic: Secondary | ICD-10-CM | POA: Diagnosis not present

## 2020-10-01 NOTE — Patient Instructions (Signed)
   Let me know how you are feeling over the weekend if you are not feeling better.

## 2020-10-01 NOTE — Assessment & Plan Note (Signed)
Subacute She has been sick for about 3 weeks-likely had more than 1 illness-possibly bronchitis, ear infection may be even a sinus infection She did take throughout these past 3 weeks a course of prednisone, Tessalon Perles, cough syrup and Omnicef for 10 days along with over-the-counter cold medications Today's the first day she has felt well She still has residual symptoms We both agreed to see how she does over the next 24-48 hours and not add anything new-ideally we want to avoid another antibiotic Over-the-counter medication for symptom relief, rest, fluids, vitamins She will update me over the next few days if she is not feeling better

## 2020-10-04 ENCOUNTER — Encounter: Payer: Self-pay | Admitting: Internal Medicine

## 2020-10-17 MED ORDER — MOLNUPIRAVIR 200 MG PO CAPS: 800.0000 mg | ORAL_CAPSULE | Freq: Two times a day (BID) | ORAL | 0 refills | Status: AC

## 2020-10-17 NOTE — Addendum Note (Signed)
Addended by: Binnie Rail on: 10/17/2020 04:32 PM   Modules accepted: Orders

## 2020-11-11 ENCOUNTER — Encounter: Payer: Self-pay | Admitting: Internal Medicine

## 2020-11-11 NOTE — Progress Notes (Signed)
Outside notes received. Information abstracted. Notes sent to scan.  

## 2020-11-19 DIAGNOSIS — H524 Presbyopia: Secondary | ICD-10-CM | POA: Diagnosis not present

## 2020-11-19 DIAGNOSIS — H2513 Age-related nuclear cataract, bilateral: Secondary | ICD-10-CM | POA: Diagnosis not present

## 2020-11-19 DIAGNOSIS — H43811 Vitreous degeneration, right eye: Secondary | ICD-10-CM | POA: Diagnosis not present

## 2020-12-07 DIAGNOSIS — Z23 Encounter for immunization: Secondary | ICD-10-CM | POA: Diagnosis not present

## 2020-12-17 ENCOUNTER — Other Ambulatory Visit: Payer: Self-pay | Admitting: Obstetrics and Gynecology

## 2020-12-17 DIAGNOSIS — E039 Hypothyroidism, unspecified: Secondary | ICD-10-CM

## 2021-01-06 ENCOUNTER — Other Ambulatory Visit: Payer: Self-pay | Admitting: Internal Medicine

## 2021-01-10 ENCOUNTER — Telehealth: Payer: Self-pay

## 2021-01-10 ENCOUNTER — Encounter: Payer: Self-pay | Admitting: Internal Medicine

## 2021-01-10 NOTE — Telephone Encounter (Signed)
Patient called to cancel upcoming appointments due to transferring care and would like medical records. Call transferred to medical records.

## 2021-01-12 ENCOUNTER — Ambulatory Visit: Payer: Medicare Other | Admitting: Internal Medicine

## 2021-01-19 ENCOUNTER — Other Ambulatory Visit: Payer: Self-pay | Admitting: Obstetrics and Gynecology

## 2021-01-19 NOTE — Telephone Encounter (Signed)
Per 07/07/20 "Not using vaginal estradiol 10 mcg due to facial breakouts." Then Dr.Silva also mentioned " She may use the vagifem 10 mcg and split it in half and use 5 mcg per vagina twice weekly if desired."  Left message for patient to call to see if she needs refill.

## 2021-01-21 DIAGNOSIS — E781 Pure hyperglyceridemia: Secondary | ICD-10-CM | POA: Diagnosis not present

## 2021-01-21 DIAGNOSIS — R053 Chronic cough: Secondary | ICD-10-CM | POA: Diagnosis not present

## 2021-01-21 DIAGNOSIS — Z148 Genetic carrier of other disease: Secondary | ICD-10-CM | POA: Diagnosis not present

## 2021-01-21 DIAGNOSIS — Z818 Family history of other mental and behavioral disorders: Secondary | ICD-10-CM | POA: Diagnosis not present

## 2021-01-21 DIAGNOSIS — B009 Herpesviral infection, unspecified: Secondary | ICD-10-CM | POA: Diagnosis not present

## 2021-01-21 DIAGNOSIS — E039 Hypothyroidism, unspecified: Secondary | ICD-10-CM | POA: Diagnosis not present

## 2021-01-21 DIAGNOSIS — M8580 Other specified disorders of bone density and structure, unspecified site: Secondary | ICD-10-CM | POA: Diagnosis not present

## 2021-02-01 DIAGNOSIS — F419 Anxiety disorder, unspecified: Secondary | ICD-10-CM | POA: Diagnosis not present

## 2021-02-01 DIAGNOSIS — R682 Dry mouth, unspecified: Secondary | ICD-10-CM | POA: Diagnosis not present

## 2021-02-02 ENCOUNTER — Other Ambulatory Visit: Payer: Self-pay | Admitting: *Deleted

## 2021-02-02 ENCOUNTER — Other Ambulatory Visit: Payer: Self-pay | Admitting: Obstetrics and Gynecology

## 2021-02-02 MED ORDER — ESTRADIOL 10 MCG VA TABS: ORAL_TABLET | VAGINAL | 1 refills | Status: DC

## 2021-02-02 NOTE — Telephone Encounter (Signed)
Dr. Quincy Simmonds Rachel Knight Rachel Knight called requesting on vagifem 10 mcg tablet.  Last annual exam 07/07/20 Annual exam scheduled on 07/14/21 Last mammogram 09/24/20

## 2021-04-08 ENCOUNTER — Ambulatory Visit: Payer: Medicare Other

## 2021-05-10 ENCOUNTER — Encounter: Payer: Self-pay | Admitting: Gastroenterology

## 2021-05-17 DIAGNOSIS — E781 Pure hyperglyceridemia: Secondary | ICD-10-CM | POA: Diagnosis not present

## 2021-05-17 DIAGNOSIS — E039 Hypothyroidism, unspecified: Secondary | ICD-10-CM | POA: Diagnosis not present

## 2021-05-17 DIAGNOSIS — M8580 Other specified disorders of bone density and structure, unspecified site: Secondary | ICD-10-CM | POA: Diagnosis not present

## 2021-05-24 DIAGNOSIS — Z818 Family history of other mental and behavioral disorders: Secondary | ICD-10-CM | POA: Diagnosis not present

## 2021-05-24 DIAGNOSIS — Z1331 Encounter for screening for depression: Secondary | ICD-10-CM | POA: Diagnosis not present

## 2021-05-24 DIAGNOSIS — Z0000 Encounter for general adult medical examination without abnormal findings: Secondary | ICD-10-CM | POA: Diagnosis not present

## 2021-05-24 DIAGNOSIS — M8580 Other specified disorders of bone density and structure, unspecified site: Secondary | ICD-10-CM | POA: Diagnosis not present

## 2021-05-24 DIAGNOSIS — F419 Anxiety disorder, unspecified: Secondary | ICD-10-CM | POA: Diagnosis not present

## 2021-05-24 DIAGNOSIS — E781 Pure hyperglyceridemia: Secondary | ICD-10-CM | POA: Diagnosis not present

## 2021-05-24 DIAGNOSIS — E039 Hypothyroidism, unspecified: Secondary | ICD-10-CM | POA: Diagnosis not present

## 2021-05-24 DIAGNOSIS — Z1339 Encounter for screening examination for other mental health and behavioral disorders: Secondary | ICD-10-CM | POA: Diagnosis not present

## 2021-05-24 DIAGNOSIS — R053 Chronic cough: Secondary | ICD-10-CM | POA: Diagnosis not present

## 2021-06-06 ENCOUNTER — Other Ambulatory Visit: Payer: Self-pay | Admitting: Internal Medicine

## 2021-06-27 DIAGNOSIS — M720 Palmar fascial fibromatosis [Dupuytren]: Secondary | ICD-10-CM | POA: Diagnosis not present

## 2021-07-13 NOTE — Progress Notes (Addendum)
71 y.o. G1P2002 Married Caucasian female here for an office visit.  ?Patient is followed for osteopenia, FH breast cancer, and vaginal atrophy. ? ?She is using Vagifem once weekly.  ?She was having facial breakouts and she was treated with Doxycycline for several weeks due to concern for possible rosacea.  ?Reducing Vagifem to once weekly helped this.  ?She wants to increase to twice weekly to potentially help with her hair growth.  ? ?Her PCP is prescribing her Valtrex and her Synthroid.  ? ?Is having some urinary leakage which is not a problem for her now.  ?No leaking with exercise.  ?Some urgency if her bladder is full.  ?Not getting up much at night.  ? ?Doing well on her thyroid medication.  ?Her thyroid function was checked with her PCP. ? ?Had Covid in December.  ? ?PCP:  Sueanne Margarita, MD  ? ?Patient's last menstrual period was 06/17/2011.     ?  ?    ?Sexually active: Yes.    ?The current method of family planning is post menopausal status.    ?Exercising: Yes.     Swimming, walking, biking, yoga ?Smoker:  no ? ?Health Maintenance: ?Pap:  07-07-20 Neg, 04-30-18 Neg, 03-16-16 Neg:Neg HR HPV ?History of abnormal Pap:  no ?MMG:  09-29-20 Saline Implants/Neg/Birads2 ?Colonoscopy:  01-11-16 polyp;next due 2025 ?BMD:  06-11-19  Result :Osteopenia ?TDaP:  04-30-18 ?Gardasil:   n/a ?HIV: 07-21-15 NR ?Hep C: 07-21-15 Neg ?Screening Labs: PCP ? ? reports that she has quit smoking. Her smoking use included cigarettes. She has never used smokeless tobacco. She reports current alcohol use of about 2.0 - 4.0 standard drinks per week. She reports that she does not use drugs. ? ?Past Medical History:  ?Diagnosis Date  ? Anxiety   ? Depression   ? GERD (gastroesophageal reflux disease)   ? occasional   ? HSV-1 infection   ? Hypothyroidism   ? Osteopenia   ? Perennial allergic rhinitis   ? STD (sexually transmitted disease)   ? 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  ? Vitamin D deficiency   ? ? ?Past Surgical History:  ?Procedure Laterality Date  ?  APPENDECTOMY  2011  ? AUGMENTATION MAMMAPLASTY    ? [place]  ? COLONOSCOPY  2000 , 2007  ? Dr Olevia Perches. Due 2017  ? NECK LIFT  11/2015  ? WISDOM TOOTH EXTRACTION    ? ? ?Current Outpatient Medications  ?Medication Sig Dispense Refill  ? Cholecalciferol (VITAMIN D3) 2000 units TABS Take 4,000 Units by mouth daily.     ? citalopram (CELEXA) 10 MG tablet Take 1 tablet po q daily Orally Once a day for 90 days    ? Estradiol 10 MCG TABS vaginal tablet Place one tablet (10 mcg) in the vagina twice weekly. 24 tablet 1  ? ipratropium (ATROVENT) 0.03 % nasal spray Place 2 sprays into both nostrils every 12 (twelve) hours.    ? levothyroxine (SYNTHROID) 75 MCG tablet 1 tablet in the morning on an empty stomach Orally Once a day for 90 days    ? Multiple Vitamins-Minerals (OSTEOPRIME ULTRA) TABS as directed.     ? valACYclovir (VALTREX) 500 MG tablet Take 1 tablet po prn for flare ups Orally as directed for 30 days    ? ?No current facility-administered medications for this visit.  ? ? ?Family History  ?Problem Relation Age of Onset  ? Breast cancer Mother 33  ? Thyroid disease Mother   ? Dementia Mother   ?  Stroke Father 82  ? Colon polyps Father   ? Other Brother 57  ?     OS muscle paralysis , resolution over 3 days. Neg evaluation  ? Coronary artery disease Maternal Grandmother 47  ? Coronary artery disease Maternal Grandfather 60  ? Colon cancer Paternal Grandmother   ? Colon cancer Paternal Grandfather   ? Diabetes Neg Hx   ? Esophageal cancer Neg Hx   ? Rectal cancer Neg Hx   ? Stomach cancer Neg Hx   ? ? ?Review of Systems  ?All other systems reviewed and are negative. ? ?Exam:   ?BP 120/70   Pulse (!) 56   Ht 5' 5.75" (1.67 m)   Wt 117 lb (53.1 kg)   LMP 06/17/2011   SpO2 (!) 14%   BMI 19.03 kg/m?     ?General appearance: alert, cooperative and appears stated age ?Head: normocephalic, without obvious abnormality, atraumatic ?Neck: no adenopathy, supple, symmetrical, trachea midline and  thyroid normal to inspection and palpation ?Lungs: clear to auscultation bilaterally ?Breasts: bilateral implants, normal appearance, no masses or tenderness, No nipple retraction or dimpling, No nipple discharge or bleeding, No axillary adenopathy ?Heart: regular rate and rhythm ?Abdomen: soft, non-tender; no masses, no organomegaly ?Extremities: extremities normal, atraumatic, no cyanosis or edema ?Skin: skin color, texture, turgor normal. No rashes or lesions ?Lymph nodes: cervical, supraclavicular, and axillary nodes normal. ?Neurologic: grossly normal ? ?Pelvic: External genitalia:  no lesions ?             No abnormal inguinal nodes palpated. ?             Urethra:  normal appearing urethra with no masses, tenderness or lesions ?             Bartholins and Skenes: normal    ?             Vagina: normal appearing vagina with normal color and discharge, no lesions ?             Cervix: no lesions ?             Pap taken: no ?Bimanual Exam:  Uterus:  normal size, contour, position, consistency, mobility, non-tender ?             Adnexa: no mass, fullness, tenderness ?             Rectal exam: yes.  Confirms. ?             Anus:  normal sphincter tone, no lesions ? ?Chaperone was present for exam:  Raquel Sarna, RN ? ?Assessment:   ?Well woman visit with gynecologic exam. ?Bilateral breast augmentation.  ?FH of breast cancer in mother in her 37s.  ?Atrophy of vagina. 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  Valtrex prn through PCP. ?Osteopenia.  ?Encounter for medication monitoring.  ? ?Plan: ?Mammogram screening discussed. ?Self breast awareness reviewed. ?Pap and HR HPV as above. ?Guidelines for Calcium, Vitamin D, regular exercise program including cardiovascular and weight bearing exercise. ?Refill of Vagifem.   Ok to use 10 mcg twice weekly.  #24, RF 3.  ?BMD from 2021 reviewed. ?BMD this July at [facility].  ?Follow up annually and prn.  ? ?After visit summary provided.  ? ?25 min  total time was spent for this patient encounter,  including preparation, face-to-face counseling with the patient, coordination of care, and documentation of the encounter. ? ? ? ? ?

## 2021-07-14 ENCOUNTER — Ambulatory Visit (INDEPENDENT_AMBULATORY_CARE_PROVIDER_SITE_OTHER): Payer: Medicare Other | Admitting: Obstetrics and Gynecology

## 2021-07-14 ENCOUNTER — Encounter: Payer: Self-pay | Admitting: Obstetrics and Gynecology

## 2021-07-14 VITALS — BP 120/70 | HR 56 | Ht 65.75 in | Wt 117.0 lb

## 2021-07-14 DIAGNOSIS — M8580 Other specified disorders of bone density and structure, unspecified site: Secondary | ICD-10-CM

## 2021-07-14 DIAGNOSIS — N952 Postmenopausal atrophic vaginitis: Secondary | ICD-10-CM | POA: Diagnosis not present

## 2021-07-14 DIAGNOSIS — Z9189 Other specified personal risk factors, not elsewhere classified: Secondary | ICD-10-CM | POA: Diagnosis not present

## 2021-07-14 DIAGNOSIS — Z01419 Encounter for gynecological examination (general) (routine) without abnormal findings: Secondary | ICD-10-CM

## 2021-07-14 DIAGNOSIS — B009 Herpesviral infection, unspecified: Secondary | ICD-10-CM

## 2021-07-14 DIAGNOSIS — Z5181 Encounter for therapeutic drug level monitoring: Secondary | ICD-10-CM | POA: Diagnosis not present

## 2021-07-14 MED ORDER — ESTRADIOL 10 MCG VA TABS: ORAL_TABLET | VAGINAL | 3 refills | Status: DC

## 2021-07-14 NOTE — Patient Instructions (Signed)
EXERCISE AND DIET:  We recommended that you start or continue a regular exercise program for good health. Regular exercise means any activity that makes your heart beat faster and makes you sweat.  We recommend exercising at least 30 minutes per day at least 3 days a week, preferably 4 or 5.  We also recommend a diet low in fat and sugar.  Inactivity, poor dietary choices and obesity can cause diabetes, heart attack, stroke, and kidney damage, among others.   ? ?ALCOHOL AND SMOKING:  Women should limit their alcohol intake to no more than 7 drinks/beers/glasses of wine (combined, not each!) per week. Moderation of alcohol intake to this level decreases your risk of breast cancer and liver damage. And of course, no recreational drugs are part of a healthy lifestyle.  And absolutely no smoking or even second hand smoke. Most people know smoking can cause heart and lung diseases, but did you know it also contributes to weakening of your bones? Aging of your skin?  Yellowing of your teeth and nails? ? ?CALCIUM AND VITAMIN D:  Adequate intake of calcium and Vitamin D are recommended.  The recommendations for exact amounts of these supplements seem to change often, but generally speaking 600 mg of calcium (either carbonate or citrate) and 800 units of Vitamin D per day seems prudent. Certain women may benefit from higher intake of Vitamin D.  If you are among these women, your doctor will have told you during your visit.   ? ?PAP SMEARS:  Pap smears, to check for cervical cancer or precancers,  have traditionally been done yearly, although recent scientific advances have shown that most women can have pap smears less often.  However, every woman still should have a physical exam from her gynecologist every year. It will include a breast check, inspection of the vulva and vagina to check for abnormal growths or skin changes, a visual exam of the cervix, and then an exam to evaluate the size and shape of the uterus and  ovaries.  And after 69 years of age, a rectal exam is indicated to check for rectal cancers. We will also provide age appropriate advice regarding health maintenance, like when you should have certain vaccines, screening for sexually transmitted diseases, bone density testing, colonoscopy, mammograms, etc.  ? ?MAMMOGRAMS:  All women over 45 years old should have a yearly mammogram. Many facilities now offer a "3D" mammogram, which may cost around $50 extra out of pocket. If possible,  we recommend you accept the option to have the 3D mammogram performed.  It both reduces the number of women who will be called back for extra views which then turn out to be normal, and it is better than the routine mammogram at detecting truly abnormal areas.   ? ?COLONOSCOPY:  Colonoscopy to screen for colon cancer is recommended for all women at age 18.  We know, you hate the idea of the prep.  We agree, BUT, having colon cancer and not knowing it is worse!!  Colon cancer so often starts as a polyp that can be seen and removed at colonscopy, which can quite literally save your life!  And if your first colonoscopy is normal and you have no family history of colon cancer, most women don't have to have it again for 10 years.  Once every ten years, you can do something that may end up saving your life, right?  We will be happy to help you get it scheduled when you are ready.  Be sure to check your insurance coverage so you understand how much it will cost.  It may be covered as a preventative service at no cost, but you should check your particular policy.   ? ?Calcium Content in Foods ?Calcium is the most abundant mineral in the body. Most of the body's calcium supply is stored in bones and teeth. Calcium helps many parts of the body function normally, including: ?Blood and blood vessels. ?Nerves. ?Hormones. ?Muscles. ?Bones and teeth. ?When your calcium stores are low, you may be at risk for low bone mass, bone loss, and broken bones  (fractures). When you get enough calcium, it helps to support strong bones and teeth throughout your life. ?Calcium is especially important for: ?Children during growth spurts. ?Girls during adolescence. ?Women who are pregnant or breastfeeding. ?Women after their menstrual cycle stops (postmenopause). ?Women whose menstrual cycle has stopped due to anorexia nervosa or regular intense exercise. ?People who cannot eat or digest dairy products. ?Vegans. ?Recommended daily amounts of calcium: ?Women (ages 32 to 71): 1,000 mg per day. ?Women (ages 21 and older): 1,200 mg per day. ?Men (ages 29 to 37): 1,000 mg per day. ?Men (ages 43 and older): 1,200 mg per day. ?Women (ages 5 to 48): 1,300 mg per day. ?Men (ages 6 to 68): 1,300 mg per day. ?General information ?Eat foods that are high in calcium. Try to get most of your calcium from food. ?Some people may benefit from taking calcium supplements. Check with your health care provider or diet and nutrition specialist (dietitian) before starting any calcium supplements. Calcium supplements may interact with certain medicines. Too much calcium may cause other health problems, such as constipation and kidney stones. ?For the body to absorb calcium, it needs vitamin D. Sources of vitamin D include: ?Skin exposure to direct sunlight. ?Foods, such as egg yolks, liver, mushrooms, saltwater fish, and fortified milk. ?Vitamin D supplements. Check with your health care provider or dietitian before starting any vitamin D supplements. ?What foods are high in calcium? ? ?Foods that are high in calcium contain more than 100 milligrams per serving. ?Fruits ?Fortified orange juice or other fruit juice, 300 mg per 8 oz serving. ?Vegetables ?Collard greens, 360 mg per 8 oz serving. ?Kale, 100 mg per 8 oz serving. ?Bok choy, 160 mg per 8 oz serving. ?Grains ?Fortified ready-to-eat cereals, 100 to 1,000 mg per 8 oz serving. ?Fortified frozen waffles, 200 mg in 2 waffles. ?Oatmeal, 140 mg in  1 cup. ?Meats and other proteins ?Sardines, canned with bones, 325 mg per 3 oz serving. ?Salmon, canned with bones, 180 mg per 3 oz serving. ?Canned shrimp, 125 mg per 3 oz serving. ?Baked beans, 160 mg per 4 oz serving. ?Tofu, firm, made with calcium sulfate, 253 mg per 4 oz serving. ?Dairy ?Yogurt, plain, low-fat, 310 mg per 6 oz serving. ?Nonfat milk, 300 mg per 8 oz serving. ?American cheese, 195 mg per 1 oz serving. ?Cheddar cheese, 205 mg per 1 oz serving. ?Cottage cheese 2%, 105 mg per 4 oz serving. ?Fortified soy, rice, or almond milk, 300 mg per 8 oz serving. ?Mozzarella, part skim, 210 mg per 1 oz serving. ?The items listed above may not be a complete list of foods high in calcium. Actual amounts of calcium may be different depending on processing. Contact a dietitian for more information. ?What foods are lower in calcium? ?Foods that are lower in calcium contain 50 mg or less per serving. ?Fruits ?Apple, about 6 mg. ?Banana, about 12 mg. ?  Vegetables ?Lettuce, 19 mg per 2 oz serving. ?Tomato, about 11 mg. ?Grains ?Rice, 4 mg per 6 oz serving. ?Boiled potatoes, 14 mg per 8 oz serving. ?White bread, 6 mg per slice. ?Meats and other proteins ?Egg, 27 mg per 2 oz serving. ?Red meat, 7 mg per 4 oz serving. ?Chicken, 17 mg per 4 oz serving. ?Fish, cod, or trout, 20 mg per 4 oz serving. ?Dairy ?Cream cheese, regular, 14 mg per 1 Tbsp serving. ?Brie cheese, 50 mg per 1 oz serving. ?Parmesan cheese, 70 mg per 1 Tbsp serving. ?The items listed above may not be a complete list of foods lower in calcium. Actual amounts of calcium may be different depending on processing. Contact a dietitian for more information. ?Summary ?Calcium is an important mineral in the body because it affects many functions. Getting enough calcium helps support strong bones and teeth throughout your life. ?Try to get most of your calcium from food. ?Calcium supplements may interact with certain medicines. Check with your health care provider  or dietitian before starting any calcium supplements. ?This information is not intended to replace advice given to you by your health care provider. Make sure you discuss any questions you have with your h

## 2021-09-01 DIAGNOSIS — E781 Pure hyperglyceridemia: Secondary | ICD-10-CM | POA: Diagnosis not present

## 2021-09-01 DIAGNOSIS — M9906 Segmental and somatic dysfunction of lower extremity: Secondary | ICD-10-CM | POA: Diagnosis not present

## 2021-09-01 DIAGNOSIS — M9903 Segmental and somatic dysfunction of lumbar region: Secondary | ICD-10-CM | POA: Diagnosis not present

## 2021-09-01 DIAGNOSIS — R053 Chronic cough: Secondary | ICD-10-CM | POA: Diagnosis not present

## 2021-09-01 DIAGNOSIS — M9902 Segmental and somatic dysfunction of thoracic region: Secondary | ICD-10-CM | POA: Diagnosis not present

## 2021-09-01 DIAGNOSIS — L659 Nonscarring hair loss, unspecified: Secondary | ICD-10-CM | POA: Diagnosis not present

## 2021-09-01 DIAGNOSIS — F419 Anxiety disorder, unspecified: Secondary | ICD-10-CM | POA: Diagnosis not present

## 2021-09-01 DIAGNOSIS — Z818 Family history of other mental and behavioral disorders: Secondary | ICD-10-CM | POA: Diagnosis not present

## 2021-09-01 DIAGNOSIS — Z148 Genetic carrier of other disease: Secondary | ICD-10-CM | POA: Diagnosis not present

## 2021-09-01 DIAGNOSIS — M9904 Segmental and somatic dysfunction of sacral region: Secondary | ICD-10-CM | POA: Diagnosis not present

## 2021-09-01 DIAGNOSIS — M9905 Segmental and somatic dysfunction of pelvic region: Secondary | ICD-10-CM | POA: Diagnosis not present

## 2021-09-01 DIAGNOSIS — M810 Age-related osteoporosis without current pathological fracture: Secondary | ICD-10-CM | POA: Diagnosis not present

## 2021-09-01 DIAGNOSIS — R7989 Other specified abnormal findings of blood chemistry: Secondary | ICD-10-CM | POA: Diagnosis not present

## 2021-09-01 DIAGNOSIS — E039 Hypothyroidism, unspecified: Secondary | ICD-10-CM | POA: Diagnosis not present

## 2021-09-01 DIAGNOSIS — B009 Herpesviral infection, unspecified: Secondary | ICD-10-CM | POA: Diagnosis not present

## 2021-09-02 DIAGNOSIS — L718 Other rosacea: Secondary | ICD-10-CM | POA: Diagnosis not present

## 2021-09-02 DIAGNOSIS — L821 Other seborrheic keratosis: Secondary | ICD-10-CM | POA: Diagnosis not present

## 2021-10-21 DIAGNOSIS — M810 Age-related osteoporosis without current pathological fracture: Secondary | ICD-10-CM | POA: Diagnosis not present

## 2021-12-06 DIAGNOSIS — M720 Palmar fascial fibromatosis [Dupuytren]: Secondary | ICD-10-CM | POA: Diagnosis not present

## 2021-12-06 DIAGNOSIS — H61002 Unspecified perichondritis of left external ear: Secondary | ICD-10-CM | POA: Diagnosis not present

## 2021-12-06 DIAGNOSIS — L821 Other seborrheic keratosis: Secondary | ICD-10-CM | POA: Diagnosis not present

## 2021-12-06 DIAGNOSIS — D225 Melanocytic nevi of trunk: Secondary | ICD-10-CM | POA: Diagnosis not present

## 2021-12-06 DIAGNOSIS — D2262 Melanocytic nevi of left upper limb, including shoulder: Secondary | ICD-10-CM | POA: Diagnosis not present

## 2021-12-23 DIAGNOSIS — E781 Pure hyperglyceridemia: Secondary | ICD-10-CM | POA: Diagnosis not present

## 2021-12-23 DIAGNOSIS — L659 Nonscarring hair loss, unspecified: Secondary | ICD-10-CM | POA: Diagnosis not present

## 2021-12-23 DIAGNOSIS — E039 Hypothyroidism, unspecified: Secondary | ICD-10-CM | POA: Diagnosis not present

## 2021-12-23 DIAGNOSIS — Z818 Family history of other mental and behavioral disorders: Secondary | ICD-10-CM | POA: Diagnosis not present

## 2021-12-23 DIAGNOSIS — R053 Chronic cough: Secondary | ICD-10-CM | POA: Diagnosis not present

## 2021-12-23 DIAGNOSIS — R7989 Other specified abnormal findings of blood chemistry: Secondary | ICD-10-CM | POA: Diagnosis not present

## 2021-12-23 DIAGNOSIS — B009 Herpesviral infection, unspecified: Secondary | ICD-10-CM | POA: Diagnosis not present

## 2021-12-23 DIAGNOSIS — M810 Age-related osteoporosis without current pathological fracture: Secondary | ICD-10-CM | POA: Diagnosis not present

## 2021-12-23 DIAGNOSIS — F419 Anxiety disorder, unspecified: Secondary | ICD-10-CM | POA: Diagnosis not present

## 2022-01-12 ENCOUNTER — Other Ambulatory Visit (HOSPITAL_COMMUNITY): Payer: Self-pay

## 2022-01-13 ENCOUNTER — Ambulatory Visit (HOSPITAL_COMMUNITY)
Admission: RE | Admit: 2022-01-13 | Discharge: 2022-01-13 | Disposition: A | Discharge status: Home / Self Care | Payer: Medicare Other | Source: Ambulatory Visit | Attending: Internal Medicine | Admitting: Internal Medicine

## 2022-01-13 DIAGNOSIS — M810 Age-related osteoporosis without current pathological fracture: Secondary | ICD-10-CM | POA: Diagnosis not present

## 2022-01-13 MED ORDER — DENOSUMAB 60 MG/ML ~~LOC~~ SOSY: 60.0000 mg | PREFILLED_SYRINGE | Freq: Once | SUBCUTANEOUS | Status: AC

## 2022-01-13 MED ORDER — DENOSUMAB 60 MG/ML ~~LOC~~ SOSY
PREFILLED_SYRINGE | SUBCUTANEOUS | Status: AC
  Administered 2022-01-13: 60 mg via SUBCUTANEOUS
  Filled 2022-01-13: qty 1

## 2022-01-16 ENCOUNTER — Encounter: Payer: Self-pay | Admitting: Nurse Practitioner

## 2022-01-19 DIAGNOSIS — M25561 Pain in right knee: Secondary | ICD-10-CM | POA: Diagnosis not present

## 2022-01-19 DIAGNOSIS — M25551 Pain in right hip: Secondary | ICD-10-CM | POA: Diagnosis not present

## 2022-01-30 DIAGNOSIS — Z23 Encounter for immunization: Secondary | ICD-10-CM | POA: Diagnosis not present

## 2022-01-31 DIAGNOSIS — H524 Presbyopia: Secondary | ICD-10-CM | POA: Diagnosis not present

## 2022-01-31 DIAGNOSIS — H2513 Age-related nuclear cataract, bilateral: Secondary | ICD-10-CM | POA: Diagnosis not present

## 2022-02-14 ENCOUNTER — Ambulatory Visit (INDEPENDENT_AMBULATORY_CARE_PROVIDER_SITE_OTHER): Payer: Medicare Other | Admitting: Allergy and Immunology

## 2022-02-14 ENCOUNTER — Other Ambulatory Visit: Payer: Self-pay

## 2022-02-14 ENCOUNTER — Encounter: Payer: Self-pay | Admitting: Allergy and Immunology

## 2022-02-14 VITALS — BP 100/60 | HR 67 | Temp 97.7°F | Resp 16 | Ht 65.75 in | Wt 121.9 lb

## 2022-02-14 DIAGNOSIS — J3089 Other allergic rhinitis: Secondary | ICD-10-CM

## 2022-02-14 DIAGNOSIS — K219 Gastro-esophageal reflux disease without esophagitis: Secondary | ICD-10-CM | POA: Diagnosis not present

## 2022-02-14 MED ORDER — FAMOTIDINE 40 MG PO TABS: 40.0000 mg | ORAL_TABLET | Freq: Every evening | ORAL | 5 refills | Status: DC

## 2022-02-14 MED ORDER — RYALTRIS 665-25 MCG/ACT NA SUSP: 2.0000 | Freq: Two times a day (BID) | NASAL | 5 refills | Status: DC

## 2022-02-14 MED ORDER — OMEPRAZOLE 40 MG PO CPDR: 40.0000 mg | DELAYED_RELEASE_CAPSULE | Freq: Two times a day (BID) | ORAL | 5 refills | Status: DC

## 2022-02-14 MED ORDER — IPRATROPIUM BROMIDE 0.06 % NA SOLN: 2.0000 | Freq: Four times a day (QID) | NASAL | 5 refills | Status: DC

## 2022-02-14 NOTE — Progress Notes (Signed)
[place] [facility] [place] [facility] [place] [facility] [place] [facility] [place]   Dear Francesco Sor,  Thank you for referring Rachel Knight to the [facility] on 02/14/2022.   Below is a summation of this patient's evaluation and recommendations.  Thank you for your referral. I will keep you informed about this patient's response to treatment.   If you have any questions please do not hesitate to contact me.   Sincerely,  Jiles Prows, MD Allergy / Immunology [facility]   ______________________________________________________________________    NEW PATIENT NOTE  Referring Provider: Sueanne Margarita, DO Primary Provider: Sueanne Margarita, DO Date of office visit: 02/14/2022    Subjective:   Chief Complaint:  Rachel Knight (DOB: Jul 24, 1950) is a 71 y.o. female who presents to the clinic on 02/14/2022 with a chief complaint of Allergy Testing (Wants to see what she is allergic to. Possible GERD) .     HPI: Rachel Knight presents to this clinic in evaluation of mucus.  She has a long history of having drainage in her throat in association with throat clearing and intermittent sore throat.  Has been particularly bad since September 2023.  As well, she has some intermittent sneezing but not a tremendous amount of nasal congestion.  She does have rhinorrhea.  She does not have any anosmia or ugly nasal discharge.  She does have a history of reflux and has been given omeprazole in the past.  When she was drinking alcohol at around 3 drinks per day during the pandemic she had chest pain in her sternal region.  She now drinks alcohol about twice a week.  She does not really consume much caffeine.  She has chocolate about every other week.  She has been given Flonase, Zyrtec, and various other antihistamines which have not helped this issue very much.  She has been given nasal ipratropium which does help her rhinorrhea but may  or may not do anything with her throat.  Past Medical History:  Diagnosis Date  . Anxiety   . Depression   . GERD (gastroesophageal reflux disease)    occasional   . HSV-1 infection   . Hypothyroidism   . Osteopenia   . Perennial allergic rhinitis   . 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  Marland Kitchen Vitamin D deficiency     Past Surgical History:  Procedure Laterality Date  . APPENDECTOMY  2011  . AUGMENTATION MAMMAPLASTY    . BREAST ENHANCEMENT SURGERY  1996  . COLONOSCOPY  2000 , 2007   Dr Olevia Perches. Due 2017  . NECK LIFT  11/2015  . WISDOM TOOTH EXTRACTION      Allergies as of 02/14/2022       Reactions   Augmentin [amoxicillin-pot Clavulanate] Other (See Comments)   GERD, upset stomach   Other Itching   Food sensitivities        Medication List    citalopram 10 MG tablet Commonly known as: CELEXA Take 1 tablet po q daily Orally Once a day for 90 days   Estradiol 10 MCG Tabs vaginal tablet Place one tablet (10 mcg) in the vagina twice weekly.   ipratropium 0.03 % nasal spray Commonly known as: ATROVENT Place 2 sprays into both nostrils every 12 (twelve) hours.   levothyroxine 75 MCG tablet Commonly known as: SYNTHROID 1 tablet in the morning on an empty stomach Orally Once a day for 90 days  OsteoPrime Ultra Tabs as directed.   valACYclovir 500 MG tablet Commonly known as: VALTREX Take 1 tablet po prn for flare ups Orally as directed for 30 days   Vitamin D3 50 MCG (2000 UT) Tabs Take 4,000 Units by mouth daily.    Review of systems negative except as noted in HPI / PMHx or noted below:  Review of Systems  Constitutional: Negative.   HENT: Negative.    Eyes: Negative.   Respiratory: Negative.    Cardiovascular: Negative.   Gastrointestinal: Negative.   Genitourinary: Negative.   Musculoskeletal: Negative.   Skin: Negative.   Neurological: Negative.   Endo/Heme/Allergies: Negative.   Psychiatric/Behavioral: Negative.       Family History  Problem Relation Age of Onset  . Allergic rhinitis Mother   . Breast cancer Mother 110  . Thyroid disease Mother   . Dementia Mother   . Stroke Father 89  . Colon polyps Father   . Other Brother 45       OS muscle paralysis , resolution over 3 days. Neg evaluation  . Coronary artery disease Maternal Grandmother 63  . Coronary artery disease Maternal Grandfather 60  . Colon cancer Paternal Grandmother   . Colon cancer Paternal Grandfather   . Diabetes Neg Hx   . Esophageal cancer Neg Hx   . Rectal cancer Neg Hx   . Stomach cancer Neg Hx     Social History   Socioeconomic History  . Marital status: Married    Spouse name: Not on file  . Number of children: 2  . Years of education: Not on file  . Highest education level: Not on file  Occupational History  . Occupation: retired  Tobacco Use  . Smoking status: Former    Types: Cigarettes  . Smokeless tobacco: Never  . Tobacco comments:    only smoked age [age] , 1 ppweek  Vaping Use  . Vaping Use: Never used  Substance and Sexual Activity  . Alcohol use: Yes    Alcohol/week: 2.0 - 4.0 standard drinks of alcohol    Types: 2 - 4 Standard drinks or equivalent per week  . Drug use: No  . Sexual activity: Yes    Partners: Male    Birth control/protection: Post-menopausal    Comment: first intercourse >18  Other Topics Concern  . Not on file  Social History Narrative   Exercise: weights, walking, pilates, swimming   Social Determinants of Health   Financial Resource Strain: Low Risk  (12/11/2018)   Overall Financial Resource Strain (CARDIA)   . Difficulty of Paying Living Expenses: Not hard at all  Food Insecurity: No Food Insecurity (12/11/2018)   Hunger Vital Sign   . Worried About Charity fundraiser in the Last Year: Never true   . Ran Out of Food in the Last Year: Never true  Transportation Needs: No Transportation Needs (12/11/2018)   PRAPARE - Transportation   . Lack of Transportation  (Medical): No   . Lack of Transportation (Non-Medical): No  Physical Activity: Sufficiently Active (12/11/2018)   Exercise Vital Sign   . Days of Exercise per Week: 5 days   . Minutes of Exercise per Session: 60 min  Stress: No Stress Concern Present (12/11/2018)   [place]   . Feeling of Stress : Not at all  Social Connections: Unknown (12/11/2018)   Social Connection and Isolation Panel [NHANES]   . Frequency of Communication with Friends and Family: More  than three times a week   . Frequency of Social Gatherings with Friends and Family: More than three times a week   . Attends Religious Services: Not on file   . Active Member of Clubs or Organizations: Yes   . Attends Archivist Meetings: More than 4 times per year   . Marital Status: Married  Human resources officer Violence: Not At Risk (12/11/2018)   Humiliation, Afraid, Rape, and Kick questionnaire   . Fear of Current or Ex-Partner: No   . Emotionally Abused: No   . Physically Abused: No   . Sexually Abused: No    Environmental and Social history  Lives in a house with a dry environment, no animals located inside the household, no carpet in the bedroom, plastic on the bed, no plastic on the pillow, and no smoking ongoing with inside the household.  Objective:   Vitals:   02/14/22 0959  BP: 100/60  Pulse: 67  Resp: 16  Temp: 97.7 F (36.5 C)  SpO2: 98%   Height: 5' 5.75" (167 cm) Weight: 121 lb 14.4 oz (55.3 kg)  Physical Exam Constitutional:      Appearance: She is not diaphoretic.  HENT:     Head: Normocephalic.     Right Ear: Tympanic membrane, ear canal and external ear normal.     Left Ear: Tympanic membrane, ear canal and external ear normal.     Nose: Nose normal. No mucosal edema or rhinorrhea.     Mouth/Throat:     Pharynx: Uvula midline. No oropharyngeal exudate.  Eyes:     Conjunctiva/sclera: Conjunctivae normal.  Neck:      Thyroid: No thyromegaly.     Trachea: Trachea normal. No tracheal tenderness or tracheal deviation.  Cardiovascular:     Rate and Rhythm: Normal rate and regular rhythm.     Heart sounds: Normal heart sounds, S1 normal and S2 normal. No murmur heard. Pulmonary:     Effort: No respiratory distress.     Breath sounds: Normal breath sounds. No stridor. No wheezing or rales.  Lymphadenopathy:     Head:     Right side of head: No tonsillar adenopathy.     Left side of head: No tonsillar adenopathy.     Cervical: No cervical adenopathy.  Skin:    Findings: No erythema or rash.     Nails: There is no clubbing.  Neurological:     Mental Status: She is alert.    Diagnostics: Allergy skin tests were performed.   Spirometry was performed and demonstrated an FEV1 of *** @ *** % of predicted. FEV1/FVC = ***  The patient had an Asthma Control Test with the following results:  .     Assessment and Plan:    No diagnosis found.  There are no Patient Instructions on file for this visit.   Jiles Prows, MD Allergy / Immunology [facility]

## 2022-02-14 NOTE — Patient Instructions (Signed)
  1.  Allergen avoidance measures  2.  Treat and prevent inflammation of airway:   A. Ryaltris - 2 sprays each nostril 1-2 times per day (specialty pharmacy)  3.  Treat and prevent reflux/LPR:   A. Omeprazole 40 mg - 1 tablet 2 times per day  B. Famotidine 40 mg - 1 tablet in evening  C. Replace throat clearing with swallowing/drinking maneuver  4.  If needed:   A.  Ipratropium 0.06% -2 sprays each nostril every 6 hours to dry nose  5.  Return to clinic in 4 weeks or earlier if problem

## 2022-02-15 ENCOUNTER — Encounter: Payer: Self-pay | Admitting: Allergy and Immunology

## 2022-02-16 ENCOUNTER — Ambulatory Visit: Payer: Medicare Other | Admitting: Nurse Practitioner

## 2022-03-09 DIAGNOSIS — Z23 Encounter for immunization: Secondary | ICD-10-CM | POA: Diagnosis not present

## 2022-03-13 NOTE — Patient Instructions (Incomplete)
  1.  Allergen avoidance measures - dust mite  2.  Treat and prevent inflammation of airway:   A. Ryaltris - 2 sprays each nostril 1-2 times per day (specialty pharmacy). Ok to hold off on using this for now  3.  Treat and prevent reflux/LPR:   A. Omeprazole 40 mg - 1 tablet 2 times per day  B. Famotidine 40 mg - 1 tablet in evening. Try taking this earlier in the evening to see if this helps with your difficulty going to sleep  C. Replace throat clearing with swallowing/drinking maneuver           D. We will refer you to [facility] (Dr. Barron Alvine) for reflux symptoms  4.  If needed:   A.  Ipratropium 0.06% -2 sprays each nostril every 6 hours to dry nose. Try increasing this. Caution as this can be drying  Consider allergy injections in the future 5.  Return to clinic in 3 months or earlier if problem

## 2022-03-14 ENCOUNTER — Encounter: Payer: Self-pay | Admitting: Family

## 2022-03-14 ENCOUNTER — Ambulatory Visit (INDEPENDENT_AMBULATORY_CARE_PROVIDER_SITE_OTHER): Payer: Medicare Other | Admitting: Family

## 2022-03-14 ENCOUNTER — Other Ambulatory Visit: Payer: Self-pay

## 2022-03-14 VITALS — BP 112/60 | HR 79 | Temp 97.2°F | Resp 16 | Wt 125.6 lb

## 2022-03-14 DIAGNOSIS — K219 Gastro-esophageal reflux disease without esophagitis: Secondary | ICD-10-CM | POA: Diagnosis not present

## 2022-03-14 DIAGNOSIS — J3089 Other allergic rhinitis: Secondary | ICD-10-CM

## 2022-03-14 NOTE — Progress Notes (Signed)
[place] [address] Dept: [phone]  FOLLOW UP NOTE  Patient ID: Rachel Knight, female    DOB: 1950-08-20  Age: 72 y.o. MRN: [id number] Date of Office Visit: 03/14/2022  Assessment  Chief Complaint: Follow-up (Doing better. No refills needed at this time. Ryaltris isn't doing anything for her. Famotidine is keeping her up at night. )  HPI Rachel Knight is a 72 year old female who presents today for follow-up of perennial allergic rhinitis and laryngeal pharyngeal reflux disease.  She was last seen on February 14, 2022 by Dr. Neldon Mc.  She denies any new diagnosis or surgery since her last office visit.  Perennial allergic rhinitis: She reports rhinorrhea that is much improved since using ipratropium bromide nasal spray once a day.  She still though has some rhinorrhea.  She is not certain if she has postnasal drip.  It is hard to tell with her being treated for reflux whether it is reflux or postnasal drip.  She denies nasal congestion.  She has not had any sinus infections since we last saw her.  She feels that Ryaltris nasal spray makes her nose run.  She tried this for 3 weeks and then decided to stop.  She thinks that she may set the Ryaltris to the side and try using it again in the spring.  She has tried Flonase in the past and it did not do a thing.  She also mentions approximately 20 years ago she did immunotherapy.  Laryngopharyngeal reflux disease: She reports that her reflux symptoms are 50 to 60% improved since starting omeprazole 40 mg 1 tablet twice a day and famotidine 1 tablet in the evening.  She does feel like the famotidine is keeping her up at night.  She is wondering if backing it up earlier in the evening will help with this.  She reports before starting this regimen her reflux symptoms or horrible.  She has not seen Dr. Silverio Decamp, her GI doctor ,in 4 to 5 years.  She does not drink coffee and will maybe drink 2 drinks of alcohol a week socially.  She  tries to watch what she eats such as fried foods, tomatoes, chocolate, and dairy.   Drug Allergies:  Allergies  Allergen Reactions   Augmentin [Amoxicillin-Pot Clavulanate] Other (See Comments)    GERD, upset stomach   Other Itching    Food sensitivities    Review of Systems: Review of Systems  Constitutional:  Negative for chills and fever.  HENT:         Reports runny nose that has gotten better with ipratropium nasal spray.  She has not certain about postnasal drip due to her being treated for reflux.  She denies nasal congestion  Eyes:        Reports itchy watery eyes, but mentions she is having no retina exam tomorrow and uses tears to help.  She also uses an eyedrop recommended by her eye doctor that she does not remember the name of  Respiratory:  Negative for cough, shortness of breath and wheezing.   Cardiovascular:  Negative for chest pain and palpitations.  Gastrointestinal:        She reports that reflux symptoms are approximately 50 to 60% better.  Genitourinary:  Negative for frequency.  Skin:  Positive for itching. Negative for rash.       Reports itchy skin after starting a pharmacy compounded medication for her thinning hair.  She reports a side effect is itchy skin.  Neurological:  Negative for headaches.  Endo/Heme/Allergies:  Positive for environmental allergies.     Physical Exam: BP 112/60   Pulse 79   Temp (!) 97.2 F (36.2 C) (Temporal)   Resp 16   Wt 125 lb 9.6 oz (57 kg)   LMP 06/05/2006 (Approximate)   SpO2 99%   BMI 20.43 kg/m    Physical Exam Constitutional:      Appearance: Normal appearance.  HENT:     Head: Normocephalic and atraumatic.     Comments: Pharynx normal, eyes normal, ears normal, nose normal    Right Ear: Tympanic membrane, ear canal and external ear normal.     Left Ear: Tympanic membrane, ear canal and external ear normal.     Nose: Nose normal.     Mouth/Throat:     Mouth: Mucous membranes are moist.     Pharynx:  Oropharynx is clear.  Eyes:     Conjunctiva/sclera: Conjunctivae normal.  Cardiovascular:     Rate and Rhythm: Normal rate and regular rhythm.     Heart sounds: Normal heart sounds.  Pulmonary:     Effort: Pulmonary effort is normal.     Breath sounds: Normal breath sounds.     Comments: Lungs clear to auscultation Musculoskeletal:     Cervical back: Neck supple.  Skin:    General: Skin is warm.  Neurological:     Mental Status: She is alert and oriented to person, place, and time.  Psychiatric:        Mood and Affect: Mood normal.        Behavior: Behavior normal.        Thought Content: Thought content normal.        Judgment: Judgment normal.     Diagnostics: None  Assessment and Plan: 1. Perennial allergic rhinitis   2. LPRD (laryngopharyngeal reflux disease)     No orders of the defined types were placed in this encounter.   Patient Instructions   1.  Allergen avoidance measures - dust mite  2.  Treat and prevent inflammation of airway:   A. Ryaltris - 2 sprays each nostril 1-2 times per day (specialty pharmacy). Ok to hold off on using this for now  3.  Treat and prevent reflux/LPR:   A. Omeprazole 40 mg - 1 tablet 2 times per day  B. Famotidine 40 mg - 1 tablet in evening. Try taking this earlier in the evening to see if this helps with your difficulty going to sleep  C. Replace throat clearing with swallowing/drinking maneuver           D. We will refer you to [facility] (Dr. Barron Alvine) for reflux symptoms  4.  If needed:   A.  Ipratropium 0.06% -2 sprays each nostril every 6 hours to dry nose. Try increasing this. Caution as this can be drying  Consider allergy injections in the future 5.  Return to clinic in 3 months or earlier if problem  Return in about 3 months (around 06/13/2022), or if symptoms worsen or fail to improve.    Thank you for the opportunity to care for this patient.  Please do not hesitate to contact me with questions.  Althea Charon, FNP [facility]

## 2022-03-16 ENCOUNTER — Encounter: Payer: Self-pay | Admitting: Nurse Practitioner

## 2022-03-16 DIAGNOSIS — H43813 Vitreous degeneration, bilateral: Secondary | ICD-10-CM | POA: Diagnosis not present

## 2022-03-16 DIAGNOSIS — H35372 Puckering of macula, left eye: Secondary | ICD-10-CM | POA: Diagnosis not present

## 2022-03-16 DIAGNOSIS — H35423 Microcystoid degeneration of retina, bilateral: Secondary | ICD-10-CM | POA: Diagnosis not present

## 2022-03-16 DIAGNOSIS — H35462 Secondary vitreoretinal degeneration, left eye: Secondary | ICD-10-CM | POA: Diagnosis not present

## 2022-03-16 DIAGNOSIS — H2513 Age-related nuclear cataract, bilateral: Secondary | ICD-10-CM | POA: Diagnosis not present

## 2022-04-06 ENCOUNTER — Ambulatory Visit (INDEPENDENT_AMBULATORY_CARE_PROVIDER_SITE_OTHER): Payer: Medicare Other | Admitting: Nurse Practitioner

## 2022-04-06 ENCOUNTER — Encounter: Payer: Self-pay | Admitting: Nurse Practitioner

## 2022-04-06 VITALS — BP 90/60 | HR 67 | Ht 65.0 in | Wt 127.0 lb

## 2022-04-06 DIAGNOSIS — K219 Gastro-esophageal reflux disease without esophagitis: Secondary | ICD-10-CM

## 2022-04-06 NOTE — Patient Instructions (Addendum)
Continue Omeprazole 40 once daily to be taken 30 minutes before breakfast and Famotidine '40mg'$  one tab at bed time.  Contact our office mid March with GERD symptom update   Colonoscopy due Nov. 20204, consider upper endoscopy  at the same time.  Thank you for trusting me with your gastrointestinal care!   Carl Best, CRNP

## 2022-04-06 NOTE — Progress Notes (Signed)
04/06/2022 Rachel Knight [id number] [birth year]   CHIEF COMPLAINT: Acid reflux, discuss scheduling a colonoscopy   HISTORY OF PRESENT ILLNESS: Rachel Knight is a 72 year old female with a past medical history of anxiety, depression, hypothyroidism, osteopenia, vitamin D deficiency, Covid 22 Dec 2021, GERD and colon polyps. Past appendectomy, breast augmentation, neck lift and wisdom teeth extraction.  She presents today for further evaluation regarding acid reflux and to discuss scheduling a colonoscopy.  She endorses having acid reflux with throat irritation and frequent throat clearing.  She describes having mucus which comes up from her throat into her mouth at times as well.  No dysphagia.  No upper abdominal pain.  She is taken Omeprazole for several weeks a few times over the past few years.  She was seen by her PCP due to having worsening acid reflux symptoms with throat clearing and she was started on Omeprazole 40 mg twice daily, Famotidine 40 mg at bedtime and Atrovent nasal spray.  Her symptoms improved therefore she reduced Omeprazole to 40 mg daily 1 week ago with continued Famotidine 40 mg nightly.  She denies ever having an EGD.  She is passing a normal formed brown bowel movement daily.  No rectal bleeding or black stools.  She underwent a colonoscopy by Dr. Silverio Decamp 01/11/2016 which identified one 4 mm tubular adenomatous polyp removed from the cecum and nonbleeding internal hemorrhoids.  She was initially advised to repeat a colonoscopy then due to changes in the polyp surveillance guidelines her recall date was changed to 01/2023.  Father with history of colon polyps.  Paternal grandfather had colon cancer.     Latest Ref Rng & Units 01/07/2020    9:22 AM 11/19/2018    1:40 PM 12/05/2017   10:03 AM  CBC  WBC 4.0 - 10.5 K/uL 4.7  6.8  5.2   Hemoglobin 12.0 - 15.0 g/dL 13.7  14.2  15.2   Hematocrit 36.0 - 46.0 % 41.2  41.7  43.9   Platelets 150.0 - 400.0 K/uL 224.0  226.0   231.0        Latest Ref Rng & Units 01/07/2020    9:22 AM 11/19/2018    1:40 PM 12/05/2017   10:03 AM  CMP  Glucose 70 - 99 mg/dL 67  81  98   BUN 6 - 23 mg/dL '14  17  22   '$ Creatinine 0.40 - 1.20 mg/dL 0.86  0.79  0.82   Sodium 135 - 145 mEq/L 140  141  140   Potassium 3.5 - 5.1 mEq/L 4.2  4.3  4.4   Chloride 96 - 112 mEq/L 108  107  107   CO2 19 - 32 mEq/L '27  27  24   '$ Calcium 8.4 - 10.5 mg/dL 9.4  10.2  9.9   Total Protein 6.0 - 8.3 g/dL 6.6  6.8  7.3   Total Bilirubin 0.2 - 1.2 mg/dL 0.6  0.9  1.1   Alkaline Phos 39 - 117 U/L 56  58  55   AST 0 - 37 U/L '20  15  20   '$ ALT 0 - 35 U/L '16  13  17     '$ Colonoscopy by Dr. Silverio Decamp 01/21/2016:  - TUBULAR ADENOMA(S). - HIGH GRADE DYSPLASIA IS NOT IDENTIFIED  Colonoscopy 07/20/2005: Normal colonoscopy   Colonoscopy 07/05/1995: Normal colonoscopy   Social History: She is married.  She has 2 sons.  She is an Training and development officer.  She drinks one skinny Production assistant, radio  or one glass of wine 4 days weekly or less. No drug use.   Family History:  Mother with breast cancer and dementia. Father with history of colon polyps. Paternal grandfather had colon cancer,  paternal grandmother possibly had colon cancer.    Allergies  Allergen Reactions   Augmentin [Amoxicillin-Pot Clavulanate] Other (See Comments)    GERD, upset stomach   Other Itching    Food sensitivities     Outpatient Encounter Medications as of 04/06/2022  Medication Sig   Cholecalciferol (VITAMIN D3) 2000 units TABS Take 4,000 Units by mouth daily.    citalopram (CELEXA) 10 MG tablet Take 1 tablet po q daily Orally Once a day for 90 days   Estradiol 10 MCG TABS vaginal tablet Place one tablet (10 mcg) in the vagina twice weekly.   famotidine (PEPCID) 40 MG tablet Take 1 tablet (40 mg total) by mouth at bedtime.   ipratropium (ATROVENT) 0.06 % nasal spray Place 2 sprays into both nostrils 4 (four) times daily.   levothyroxine (SYNTHROID) 75 MCG tablet 1 tablet in the morning on an empty  stomach Orally Once a day for 90 days   Multiple Vitamins-Minerals (OSTEOPRIME ULTRA) TABS as directed.    Olopatadine-Mometasone (RYALTRIS) [id number] MCG/ACT SUSP Place 2 sprays into the nose 2 (two) times daily. (Patient not taking: Reported on 03/14/2022)   omeprazole (PRILOSEC) 40 MG capsule Take 1 capsule (40 mg total) by mouth 2 (two) times daily.   valACYclovir (VALTREX) 500 MG tablet Take 1 tablet po prn for flare ups Orally as directed for 30 days   No facility-administered encounter medications on file as of 04/06/2022.    REVIEW OF SYSTEMS:  Gen: Denies fever, sweats or chills. No weight loss.  CV: Denies chest pain, palpitations or edema. Resp: Denies cough, shortness of breath of hemoptysis.  GI: See HPI. GU : Denies urinary burning, blood in urine, increased urinary frequency or incontinence. MS: Denies joint pain, muscles aches or weakness. Derm: Denies rash, itchiness, skin lesions or unhealing ulcers. Psych: Denies depression, anxiety, memory loss or confusion. Heme: Denies bruising, easy bleeding. Neuro:  Denies headaches, dizziness or paresthesias. Endo:  Denies any problems with DM, thyroid or adrenal function.  PHYSICAL EXAM: LMP 06/05/2006 (Approximate)  BP 90/60   Pulse 67   Ht '5\' 5"'$  (1.651 m)   Wt 127 lb (57.6 kg)   LMP 06/05/2006 (Approximate)   SpO2 99%   BMI 21.13 kg/m   General: 72 year old female in no acute distress. Head: Normocephalic and atraumatic. Eyes:  Sclerae non-icteric, conjunctive pink. Ears: Normal auditory acuity. Mouth: Dentition intact. No ulcers or lesions.  Neck: Supple, no lymphadenopathy or thyromegaly.  Lungs: Clear bilaterally to auscultation without wheezes, crackles or rhonchi. Heart: Regular rate and rhythm. No murmur, rub or gallop appreciated.  Abdomen: Soft, nontender, nondistended. No masses. No hepatosplenomegaly. Normoactive bowel sounds x 4 quadrants.  Rectal: Deferred. Musculoskeletal: Symmetrical with no gross  deformities. Skin: Warm and dry. No rash or lesions on visible extremities. Extremities: No edema. Neurological: Alert oriented x 4, no focal deficits.  Psychological:  Alert and cooperative. Normal mood and affect.  ASSESSMENT AND PLAN:  85) 72 year old female with acid reflux/LPR symptoms which improved after taking omeprazole 40 mg twice daily and famotidine 40 mg nightly x 6 weeks then Omeprazole '40mg'$  QD and Famotidine '40mg'$  QHS for the past week. -GERD diet reviewed -Continue Omeprazole 40 mg daily and Famotidine 40 mg nightly -If reflux symptoms or throat clearing recurs, recommend Omeprazole 40 mg  twice daily -EGD at time of colonoscopy 01/2023, earlier if symptoms warrant patient to contact office if symptoms worsen  2) History of a 4 mm tubular adenomatous polyp per colonoscopy 01/2016.  Normal colonoscopies in 2007 and 1997.  Father with history of colon polyps.  Paternal grandfather with history of colon cancer. -Colonoscopy 01/2023   CC:  Sueanne Margarita, DO

## 2022-05-22 ENCOUNTER — Telehealth: Payer: Self-pay | Admitting: *Deleted

## 2022-05-22 NOTE — Telephone Encounter (Signed)
Patient called wanting to start allergy injections but had not contacted her insurance company yet and had questions, how many vials would she likely be receiving? I will call her back and give her codes and go over things in more detail.

## 2022-05-22 NOTE — Telephone Encounter (Signed)
It looks like from her skin test in December 2023 with Dr. Neldon Mc she was allergic to dust mite. This would be just 1 injection and 1 vial

## 2022-05-23 NOTE — Telephone Encounter (Signed)
Thank you :)

## 2022-05-23 NOTE — Telephone Encounter (Signed)
Called and spoke with patient and advised of the one shot and one vial. She states that she is having an increase in allergy symptoms with runny nose and post nasal drip and is wondering if that is from the dust mites or if she is allergic to more environmentals that did not show up in testing. She did also state that the ipratropium 0.06% is not working as well as the 0.03% was and if she could switch back to that? [facility] on [address].

## 2022-05-23 NOTE — Telephone Encounter (Signed)
Called and spoke with patient and advised, patient has been scheduled for tomorrow to further discuss look into getting labs for environmental allergies.

## 2022-05-23 NOTE — Patient Instructions (Incomplete)
  1.  Allergen avoidance measures - dust mite I will order lab work for environmental allergies to complement your skin testing you had done in November. We will call you with results once they are back   2.  Treat and prevent reflux/LPR: (see avoidance measures below)   A.  Stop Omeprazole 40 mg - 1 tablet 2 times per day. Since this seemed to cause more phlegm recently  B. Famotidine 40 mg - 1 tablet in evening.   C. Replace throat clearing with swallowing/drinking maneuver  D. Call your GI doctor to see if you can be seen sooner due to your poorly controlled reflux symptoms- despite being on omeprazole 40 mg twice a day and famotidine 40 mg at night           E. Will refer to ENT (Dr. Fredric Dine) for globus sensation, so they can look at your vocal cords  F. Avoid foods that cause the  globus/phlegm sensation that comes up from your chest ( wheat and dairy). Discussed that these symptoms do not sound like food allergy (IgE) and does not need any EpiPen. She would like to proceed with lab work still             4.  If needed:   A.  Ipratropium 0.03% -2 sprays each nostril 2-3 times a day as needed for runny nose/drainage down throat. Caution as this can be drying  Consider allergy injections in the future 5.  Return to clinic in 3 months or earlier if problem  Lifestyle Changes for Controlling GERD When you have GERD, stomach acid feels as if it's backing up toward your mouth. Whether or not you take medication to control your GERD, your symptoms can often be improved with lifestyle changes.   Raise Your Head Reflux is more likely to strike when you're lying down flat, because stomach fluid can flow backward more easily. Raising the head of your bed 4-6 inches can help. To do this: Slide blocks or books under the legs at the head of your bed. Or, place a wedge under the mattress. Many foam stores can make a suitable wedge for you. The wedge should run from your waist to the top of  your head. Don't just prop your head on several pillows. This increases pressure on your stomach. It can make GERD worse.  Watch Your Eating Habits Certain foods may increase the acid in your stomach or relax the lower esophageal sphincter, making GERD more likely. It's best to avoid the following: Coffee, tea, and carbonated drinks (with and without caffeine) Fatty, fried, or spicy food Mint, chocolate, onions, and tomatoes Any other foods that seem to irritate your stomach or cause you pain  Relieve the Pressure Eat smaller meals, even if you have to eat more often. Don't lie down right after you eat. Wait a few hours for your stomach to empty. Avoid tight belts and tight-fitting clothes. Lose excess weight.  Tobacco and Alcohol

## 2022-05-23 NOTE — Telephone Encounter (Signed)
Ok to change to ipratropium 0.03 % nasal spray using 2 sprays in  each nostril 2-3 times a day as needed for runny nose/drainage down throat. Caution as this can be drying.  If she is interested she could schedule an office visit and we could order lab work to see if she is allergic to anything else besides dust mite.

## 2022-05-24 ENCOUNTER — Encounter: Payer: Self-pay | Admitting: Family

## 2022-05-24 ENCOUNTER — Ambulatory Visit (INDEPENDENT_AMBULATORY_CARE_PROVIDER_SITE_OTHER): Payer: Medicare Other | Admitting: Family

## 2022-05-24 ENCOUNTER — Other Ambulatory Visit: Payer: Self-pay

## 2022-05-24 VITALS — BP 98/62 | HR 65 | Temp 97.6°F | Resp 16 | Ht 66.0 in | Wt 122.6 lb

## 2022-05-24 DIAGNOSIS — R09A2 Foreign body sensation, throat: Secondary | ICD-10-CM

## 2022-05-24 DIAGNOSIS — J3089 Other allergic rhinitis: Secondary | ICD-10-CM

## 2022-05-24 DIAGNOSIS — K219 Gastro-esophageal reflux disease without esophagitis: Secondary | ICD-10-CM | POA: Diagnosis not present

## 2022-05-24 MED ORDER — FAMOTIDINE 40 MG PO TABS: 40.0000 mg | ORAL_TABLET | Freq: Every evening | ORAL | 3 refills | Status: DC

## 2022-05-24 MED ORDER — IPRATROPIUM BROMIDE 0.03 % NA SOLN: NASAL | 5 refills | Status: DC

## 2022-05-24 NOTE — Progress Notes (Signed)
[place] [address] Dept: [phone]  FOLLOW UP NOTE  Patient ID: Rachel Knight, female    DOB: [birth year]  Age: 72 y.o. MRN: [id number] Date of Office Visit: 05/24/2022  Assessment  Chief Complaint: Perennial Allergic Rhinitis (Have not been good)  HPI Rachel Knight is a 72 year old female who presents today for an acute visit of globus sensation and phlegm from her chest.  She was last seen on March 14, 2022 by myself for perennial allergic rhinitis, and laryngopharyngeal reflux disease.  She denies any new diagnosis or surgery since her last office visit.  Laryngopharyngeal reflux disease: She reports that she has stopped taking omeprazole 40 mg 1 tablet twice a day recently due to feeling like it made her symptoms worse.  She felt like she had more phlegm.  She is still taking famotidine 40 mg at night.  She reports that she feels like something is stuck in her throat and she cannot clear it. This sensation has been worse for the past 8-9 months. She has not seen ENT and does not have an appointment with GI until November when she is due for a colonoscopy.  She feels that wheat and dairy makes her reflux worse.  While she was off wheat and dairy for period of time her nasal dripping and clearing of her throat was better.  She wonders if foods could be causing the symptoms.  She has food sensitivity testing in 2018 that she reports did not show much on there.  When she eats wheat or dairy she denies any cardiorespiratory, gastrointestinal or cutaneous symptoms. While she was in the office today she reports that she felt phlegm coming up and she has not had any food so far this morning.  She has noticed that milkshakes will make the phlegm.  Perennial allergic rhinitis: She wonders if her allergies could be causing her to clear her throat.  She reports postnasal drip that she feels dripping down in the morning and clear drippy nose in the morning.  When she uses her  ipratropium bromide nasal spray it works within 15 minutes.Ipratropium bromide does not help with the phlegm coming up.  She does however feel that the higher dosage of ipratropium bromide does not work as well as the lower dosage.  She also has occasional nasal congestion.  She has not had any sinus infections since we last saw her.  She uses a Nettie pot.  She uses feather pillow at night to help keep her mouth closed. She wonders if she is allergic to feathers.  Her skin testing from December 2023 was positive  to dust mite only.   Drug Allergies:  Allergies  Allergen Reactions   Augmentin [Amoxicillin-Pot Clavulanate] Other (See Comments)    GERD, upset stomach   Other Itching    Food sensitivities    Review of Systems: Review of Systems  Constitutional:  Negative for chills and fever.  HENT:         Reports post nasal drip and clear drippy nose that is better within 15 minutes of using ipratropium bromide nasal spray.  Reports occasional nasal congestion that occurs mostly in the right nostril  Eyes:        Reports dry eyes for which she uses Tears  Respiratory:  Negative for cough, shortness of breath and wheezing.   Cardiovascular:  Negative for chest pain and palpitations.  Gastrointestinal:  Positive for heartburn.       Reports phlegm that  comes up from her chest, globus sensation, and heartburn sometimes  Genitourinary:  Negative for frequency.  Skin:  Positive for itching. Negative for rash.       Reports dry skin on her back at times  Neurological:  Negative for headaches.  Endo/Heme/Allergies:  Positive for environmental allergies.    Physical Exam: BP 98/62 (BP Location: Right Arm, Patient Position: Sitting, Cuff Size: Normal)   Pulse 65   Temp 97.6 F (36.4 C) (Temporal)   Resp 16   Ht 5\' 6"  (1.676 m)   Wt 122 lb 9.6 oz (55.6 kg)   LMP 06/05/2006 (Approximate)   SpO2 100%   BMI 19.79 kg/m    Physical Exam Constitutional:      Appearance: Normal appearance.   HENT:     Head: Normocephalic and atraumatic.     Comments: Pharynx normal. Eyes normal. Ears normal. Nose normal    Right Ear: Tympanic membrane, ear canal and external ear normal.     Left Ear: Tympanic membrane, ear canal and external ear normal.     Nose: Nose normal.     Mouth/Throat:     Mouth: Mucous membranes are moist.     Pharynx: Oropharynx is clear.  Eyes:     Conjunctiva/sclera: Conjunctivae normal.  Cardiovascular:     Rate and Rhythm: Normal rate and regular rhythm.     Heart sounds: Normal heart sounds.  Pulmonary:     Effort: Pulmonary effort is normal.     Breath sounds: Normal breath sounds.     Comments: Lungs clear to auscultation Musculoskeletal:     Cervical back: Neck supple.  Skin:    General: Skin is warm.  Neurological:     Mental Status: She is alert and oriented to person, place, and time.  Psychiatric:        Mood and Affect: Mood normal.        Behavior: Behavior normal.        Thought Content: Thought content normal.        Judgment: Judgment normal.     Diagnostics:  None  Assessment and Plan: 1. Perennial allergic rhinitis   2. LPRD (laryngopharyngeal reflux disease)   3. Globus sensation     Meds ordered this encounter  Medications   famotidine (PEPCID) 40 MG tablet    Sig: Take 1 tablet (40 mg total) by mouth at bedtime.    Dispense:  30 tablet    Refill:  3   ipratropium (ATROVENT) 0.03 % nasal spray    Sig: Place 2 sprays in each nostril 2-3 times a day as needed for runny nose/drainage down throat. Caution as this can be drying    Dispense:  30 mL    Refill:  5    Patient Instructions   1.  Allergen avoidance measures - dust mite I will order lab work for environmental allergies to complement your skin testing you had done in November. We will call you with results once they are back   2.  Treat and prevent reflux/LPR: (see avoidance measures below)   A.  Stop Omeprazole 40 mg - 1 tablet 2 times per day. Since this  seemed to cause more phlegm recently  B. Famotidine 40 mg - 1 tablet in evening.   C. Replace throat clearing with swallowing/drinking maneuver  D. Call your GI doctor to see if you can be seen sooner due to your poorly controlled reflux symptoms- despite being on omeprazole 40 mg twice a day and  famotidine 40 mg at night           E. Will refer to ENT (Dr. Fredric Dine) for globus sensation, so they can look at your vocal cords  F. Avoid foods that cause the  globus/phlegm sensation that comes up from your chest ( wheat and dairy). Discussed that these symptoms do not sound like food allergy (IgE) and does not need any EpiPen. She would like to proceed with lab work still             4.  If needed:   A.  Ipratropium 0.03% -2 sprays each nostril 2-3 times a day as needed for runny nose/drainage down throat. Caution as this can be drying  Consider allergy injections in the future 5.  Return to clinic in 3 months or earlier if problem  Lifestyle Changes for Controlling GERD When you have GERD, stomach acid feels as if it's backing up toward your mouth. Whether or not you take medication to control your GERD, your symptoms can often be improved with lifestyle changes.   Raise Your Head Reflux is more likely to strike when you're lying down flat, because stomach fluid can flow backward more easily. Raising the head of your bed 4-6 inches can help. To do this: Slide blocks or books under the legs at the head of your bed. Or, place a wedge under the mattress. Many foam stores can make a suitable wedge for you. The wedge should run from your waist to the top of your head. Don't just prop your head on several pillows. This increases pressure on your stomach. It can make GERD worse.  Watch Your Eating Habits Certain foods may increase the acid in your stomach or relax the lower esophageal sphincter, making GERD more likely. It's best to avoid the following: Coffee, tea, and carbonated drinks  (with and without caffeine) Fatty, fried, or spicy food Mint, chocolate, onions, and tomatoes Any other foods that seem to irritate your stomach or cause you pain  Relieve the Pressure Eat smaller meals, even if you have to eat more often. Don't lie down right after you eat. Wait a few hours for your stomach to empty. Avoid tight belts and tight-fitting clothes. Lose excess weight.  Tobacco and Alcohol  Return in about 3 months (around 08/24/2022), or if symptoms worsen or fail to improve.    Thank you for the opportunity to care for this patient.  Please do not hesitate to contact me with questions.  Althea Charon, FNP [facility]

## 2022-05-28 LAB — ALLERGENS W/TOTAL IGE AREA 2
Alternaria Alternata IgE: <0.1 kU/L
Aspergillus Fumigatus IgE: <0.1 kU/L
Bermuda Grass IgE: <0.1 kU/L
Cat Dander IgE: <0.1 kU/L
Cedar, Mountain IgE: <0.1 kU/L
Cladosporium Herbarum IgE: <0.1 kU/L
Cockroach, German IgE: <0.1 kU/L
Common Silver Birch IgE: <0.1 kU/L
Cottonwood IgE: <0.1 kU/L
D Farinae IgE: 5.96 kU/L — AB
D Pteronyssinus IgE: 4.89 kU/L — AB
Dog Dander IgE: <0.1 kU/L
Elm, American IgE: <0.1 kU/L
IgE (Immunoglobulin E), Serum: 15 IU/mL (ref 6–495)
Johnson Grass IgE: <0.1 kU/L
Maple/Box Elder IgE: <0.1 kU/L
Mouse Urine IgE: <0.1 kU/L
Oak, White IgE: <0.1 kU/L
Pecan, Hickory IgE: <0.1 kU/L
Penicillium Chrysogen IgE: <0.1 kU/L
Pigweed, Rough IgE: <0.1 kU/L
Ragweed, Short IgE: <0.1 kU/L
Sheep Sorrel IgE Qn: <0.1 kU/L
Timothy Grass IgE: <0.1 kU/L
White Mulberry IgE: <0.1 kU/L

## 2022-05-28 LAB — ALLERGEN MILK: Milk IgE: <0.1 kU/L

## 2022-05-28 LAB — ALLERGEN, WHEAT, F4: Wheat IgE: <0.1 kU/L

## 2022-05-29 NOTE — Progress Notes (Signed)
Rachel Knight is wanting to start allergy injections. Can you please write the vaccine script

## 2022-05-29 NOTE — Progress Notes (Signed)
Please let Rachel Knight know that we received her lab work and it is positive to dust mite only.  Her lab work for milk and wheat were negative.

## 2022-06-01 ENCOUNTER — Other Ambulatory Visit: Payer: Self-pay | Admitting: Internal Medicine

## 2022-06-01 DIAGNOSIS — J3089 Other allergic rhinitis: Secondary | ICD-10-CM | POA: Diagnosis not present

## 2022-06-01 NOTE — Progress Notes (Signed)
Encounter created for AIT Rx. 

## 2022-06-01 NOTE — Progress Notes (Signed)
Aeroallergen Immunotherapy   Ordering Provider: Dr. Sigurd Sos   Patient Details  Name: Rachel Knight  MRN: [id number]  Date of Birth: 1950/08/27   Order 1 of 1   Vial Label: DM   0.5 ml (Volume)   AU Concentration -- Mite Mix (DF 5,000 & DP 5,000)    0.5  ml Extract Subtotal  4.5  ml Diluent  5.0  ml Maintenance Total   Schedule:  B  Blue Vial (1:100,000): Schedule B (6 doses)  Yellow Vial (1:10,000): Schedule B (6 doses)  Green Vial (1:1,000): Schedule B (6 doses)  Red Vial (1:100): Schedule A (10 doses)   Special Instructions: Schedule B till Red then A

## 2022-06-01 NOTE — Progress Notes (Signed)
VIALS EXP 06-01-23

## 2022-06-07 DIAGNOSIS — Z1231 Encounter for screening mammogram for malignant neoplasm of breast: Secondary | ICD-10-CM | POA: Diagnosis not present

## 2022-06-08 DIAGNOSIS — R7989 Other specified abnormal findings of blood chemistry: Secondary | ICD-10-CM | POA: Diagnosis not present

## 2022-06-08 DIAGNOSIS — M810 Age-related osteoporosis without current pathological fracture: Secondary | ICD-10-CM | POA: Diagnosis not present

## 2022-06-08 DIAGNOSIS — E039 Hypothyroidism, unspecified: Secondary | ICD-10-CM | POA: Diagnosis not present

## 2022-06-08 DIAGNOSIS — F419 Anxiety disorder, unspecified: Secondary | ICD-10-CM | POA: Diagnosis not present

## 2022-06-08 DIAGNOSIS — E781 Pure hyperglyceridemia: Secondary | ICD-10-CM | POA: Diagnosis not present

## 2022-06-12 NOTE — Progress Notes (Signed)
She is starting 06/19/22 and it looks like Dr. Maurine Minister did the script.

## 2022-06-13 DIAGNOSIS — E039 Hypothyroidism, unspecified: Secondary | ICD-10-CM | POA: Diagnosis not present

## 2022-06-13 DIAGNOSIS — M810 Age-related osteoporosis without current pathological fracture: Secondary | ICD-10-CM | POA: Diagnosis not present

## 2022-06-13 DIAGNOSIS — L659 Nonscarring hair loss, unspecified: Secondary | ICD-10-CM | POA: Diagnosis not present

## 2022-06-13 DIAGNOSIS — Z148 Genetic carrier of other disease: Secondary | ICD-10-CM | POA: Diagnosis not present

## 2022-06-13 DIAGNOSIS — Z1339 Encounter for screening examination for other mental health and behavioral disorders: Secondary | ICD-10-CM | POA: Diagnosis not present

## 2022-06-13 DIAGNOSIS — F419 Anxiety disorder, unspecified: Secondary | ICD-10-CM | POA: Diagnosis not present

## 2022-06-13 DIAGNOSIS — Z0000 Encounter for general adult medical examination without abnormal findings: Secondary | ICD-10-CM | POA: Diagnosis not present

## 2022-06-13 DIAGNOSIS — R053 Chronic cough: Secondary | ICD-10-CM | POA: Diagnosis not present

## 2022-06-13 DIAGNOSIS — E781 Pure hyperglyceridemia: Secondary | ICD-10-CM | POA: Diagnosis not present

## 2022-06-13 DIAGNOSIS — B009 Herpesviral infection, unspecified: Secondary | ICD-10-CM | POA: Diagnosis not present

## 2022-06-13 DIAGNOSIS — Z1331 Encounter for screening for depression: Secondary | ICD-10-CM | POA: Diagnosis not present

## 2022-06-13 DIAGNOSIS — Z818 Family history of other mental and behavioral disorders: Secondary | ICD-10-CM | POA: Diagnosis not present

## 2022-06-19 ENCOUNTER — Ambulatory Visit (INDEPENDENT_AMBULATORY_CARE_PROVIDER_SITE_OTHER): Payer: Medicare Other | Admitting: *Deleted

## 2022-06-19 DIAGNOSIS — J309 Allergic rhinitis, unspecified: Secondary | ICD-10-CM | POA: Diagnosis not present

## 2022-06-19 MED ORDER — EPINEPHRINE 0.3 MG/0.3ML IJ SOAJ: 0.3000 mg | INTRAMUSCULAR | 1 refills | Status: DC | PRN

## 2022-06-19 NOTE — Progress Notes (Signed)
Immunotherapy   Patient Details  Name: Rachel Knight MRN: [id number] Date of Birth: 1950/07/02  06/19/2022  Benson Setting Petz started injections for  DMITE Following schedule: B  Frequency:1 time per week Epi-Pen:Epi-Pen Available  Consent signed and patient instructions given.  Patient started allergy injections today, she received 0.63mL of DMITE in the RUA. Patient watied 30 minutes in office and did not experience any issues.   Pao Haffey Fernandez-Vernon 06/19/2022, 10:42 AM

## 2022-06-26 ENCOUNTER — Ambulatory Visit (INDEPENDENT_AMBULATORY_CARE_PROVIDER_SITE_OTHER): Payer: Medicare Other

## 2022-06-26 DIAGNOSIS — J309 Allergic rhinitis, unspecified: Secondary | ICD-10-CM

## 2022-06-26 MED ORDER — EPINEPHRINE 0.3 MG/0.3ML IJ SOAJ: 0.3000 mg | INTRAMUSCULAR | 1 refills | Status: DC | PRN

## 2022-06-26 NOTE — Progress Notes (Signed)
Patient stated that her Epi pen was sent to the wrong pharmacy. I have sent epi pen to the correct pharmacy.

## 2022-07-03 DIAGNOSIS — H6592 Unspecified nonsuppurative otitis media, left ear: Secondary | ICD-10-CM | POA: Diagnosis not present

## 2022-07-03 DIAGNOSIS — H9202 Otalgia, left ear: Secondary | ICD-10-CM | POA: Diagnosis not present

## 2022-07-03 DIAGNOSIS — H6993 Unspecified Eustachian tube disorder, bilateral: Secondary | ICD-10-CM | POA: Diagnosis not present

## 2022-07-04 ENCOUNTER — Ambulatory Visit (INDEPENDENT_AMBULATORY_CARE_PROVIDER_SITE_OTHER): Payer: Medicare Other

## 2022-07-04 DIAGNOSIS — J309 Allergic rhinitis, unspecified: Secondary | ICD-10-CM | POA: Diagnosis not present

## 2022-07-10 ENCOUNTER — Ambulatory Visit (INDEPENDENT_AMBULATORY_CARE_PROVIDER_SITE_OTHER): Payer: Medicare Other | Admitting: *Deleted

## 2022-07-10 DIAGNOSIS — J309 Allergic rhinitis, unspecified: Secondary | ICD-10-CM

## 2022-07-12 NOTE — Progress Notes (Signed)
72 y.o. G56P2002 Married Caucasian female here for 1 yr med check.    Patient is followed for FH breast cancer, and vaginal atrophy.   She is using Vagifem once a week.  No vaginal bleeding or spotting.   Had BMD at PCP office and is now taking medication.  Did not tolerate Boniva.  Is now taking Prolia.   Doing weekly allergy shots.   Has an upcoming art show in [place], [place].  PCP:   Dr. Thornell Mule  Patient's last menstrual period was 06/05/2006 (approximate).           Sexually active: Yes.    The current method of family planning is post menopausal status.    Exercising: Yes.     Pilates, walking, swimming, golf Smoker:  former  Health Maintenance: Pap:  07/07/20 neg, 04/30/18 neg History of abnormal Pap:  no MMG:  2023 and 2024 per pt, 09/09/20 Breast Density Cat A, BI-RADS CAT 2 benign Colonoscopy:  01/11/16 due in November, 2024.  BMD:   06/11/19  Result  osteopenia.  BMD at PCP office showed borderline osteoporosis.  TDaP:  04/30/18 Gardasil:   yes HIV: 07/21/15 NR Hep C: 07/21/15 Neg Screening Labs:  PCP   reports that she has quit smoking. Her smoking use included cigarettes. She has never used smokeless tobacco. She reports current alcohol use of about 2.0 - 4.0 standard drinks of alcohol per week. She reports that she does not use drugs.  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   Vitamin D deficiency     Past Surgical History:  Procedure Laterality Date   APPENDECTOMY  2011   AUGMENTATION MAMMAPLASTY     BREAST ENHANCEMENT SURGERY  1996   COLONOSCOPY  2000 , 2007   Dr Juanda Chance. Due 2017   NECK LIFT  11/2015   WISDOM TOOTH EXTRACTION      Current Outpatient Medications  Medication Sig Dispense Refill   Cholecalciferol (VITAMIN D3) 2000 units TABS  Take 4,000 Units by mouth daily.      citalopram (CELEXA) 10 MG tablet Take by mouth.     denosumab (PROLIA) 60 MG/ML SOSY injection Inject 60 mg into the skin every 6 (six) months.     EPINEPHrine (EPIPEN 2-PAK) 0.3 mg/0.3 mL IJ SOAJ injection Inject 0.3 mg into the muscle as needed for anaphylaxis. 0.3 mL 1   Estradiol 10 MCG TABS vaginal tablet Place one tablet (10 mcg) in the vagina twice weekly. 24 tablet 3   famotidine (PEPCID) 40 MG tablet Take 1 tablet (40 mg total) by mouth at bedtime. 30 tablet 3   ipratropium (ATROVENT) 0.03 % nasal spray Place 2 sprays in each nostril 2-3 times a day as needed for runny nose/drainage down throat. Caution as this can be drying 30 mL 5   levothyroxine (SYNTHROID) 75 MCG tablet 1 tablet in the morning on an empty stomach Orally Once a day for 90 days     Multiple Vitamins-Minerals (OSTEOPRIME ULTRA) TABS as directed.      valACYclovir (VALTREX) 500 MG tablet Take 1 tablet po prn for flare ups Orally as directed for 30 days     No current facility-administered medications for this visit.  Family History  Problem Relation Age of Onset   Allergic rhinitis Mother    Breast cancer Mother 23   Thyroid disease Mother    Dementia Mother    Stroke Father 21   Colon polyps Father    Other Brother 45       OS muscle paralysis , resolution over 3 days. Neg evaluation   Coronary artery disease Maternal Grandmother 63   Coronary artery disease Maternal Grandfather 60   Colon cancer Paternal Grandmother    Colon cancer Paternal Grandfather    Diabetes Neg Hx    Esophageal cancer Neg Hx    Rectal cancer Neg Hx    Stomach cancer Neg Hx     Review of Systems  All other systems reviewed and are negative.   Exam:   BP 110/72 (BP Location: Right Arm, Patient Position: Sitting, Cuff Size: Normal)   Pulse (!) 58   Ht 5\' 6"  (1.676 m)   Wt 121 lb (54.9 kg)   LMP 06/05/2006 (Approximate)   SpO2 99%   BMI 19.53 kg/m     General appearance: alert,  cooperative and appears stated age Head: normocephalic, without obvious abnormality, atraumatic Neck: no adenopathy, supple, symmetrical, trachea midline and thyroid normal to inspection and palpation Lungs: clear to auscultation bilaterally Breasts: bilateral augmentation, no masses or tenderness, No nipple retraction or dimpling, No nipple discharge or bleeding, No axillary adenopathy Heart: regular rate and rhythm Abdomen: soft, non-tender; no masses, no organomegaly Extremities: extremities normal, atraumatic, no cyanosis or edema Skin: skin color, texture, turgor normal. No rashes or lesions Lymph nodes: cervical, supraclavicular, and axillary nodes normal. Neurologic: grossly normal  Pelvic: External genitalia:  no lesions              No abnormal inguinal nodes palpated.              Urethra:  normal appearing urethra with no masses, tenderness or lesions              Bartholins and Skenes: normal                 Vagina: vagina with pale pink color and discharge, no lesions              Cervix: no lesions              Pap taken: yes Bimanual Exam:  Uterus:  normal size, contour, position, consistency, mobility, non-tender              Adnexa: no mass, fullness, tenderness              Rectal exam: yes.  Confirms.              Anus:  normal sphincter tone, no lesions  Chaperone was present for exam:  Warren Lacy, CMA  Assessment:   Well woman visit with gynecologic exam. Bilateral breast augmentation.  FH of breast cancer in mother in her 31s.  Atrophy of vagina. 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  Valtrex prn through PCP. Borderline osteoporosis.   On Prolia through PCP. Encounter for medication monitoring.  GERD.  Plan: Mammogram screening discussed.  Will get copy of mammograms form 2023 and 2024. Self breast awareness reviewed. Pap and HR HPV collected. Guidelines for Calcium, Vitamin D, regular exercise program including cardiovascular and weight bearing exercise. Refill of  Vagifem twice weekly for one year.  I discussed potential effect on breast caner.  Follow up annually and prn.  After visit summary provided.   20 min  total time was spent for this patient encounter, including preparation, face-to-face counseling with the patient, coordination of care, and documentation of the encounter for vaginal atrophy and Vagifem Rx.

## 2022-07-13 ENCOUNTER — Ambulatory Visit: Payer: BLUE CROSS/BLUE SHIELD | Admitting: Nurse Practitioner

## 2022-07-14 ENCOUNTER — Other Ambulatory Visit (HOSPITAL_COMMUNITY): Payer: Self-pay | Admitting: *Deleted

## 2022-07-17 ENCOUNTER — Ambulatory Visit (HOSPITAL_COMMUNITY)
Admission: RE | Admit: 2022-07-17 | Discharge: 2022-07-17 | Disposition: A | Discharge status: Home / Self Care | Payer: Medicare Other | Source: Ambulatory Visit | Attending: Internal Medicine | Admitting: Internal Medicine

## 2022-07-17 DIAGNOSIS — M810 Age-related osteoporosis without current pathological fracture: Secondary | ICD-10-CM | POA: Diagnosis not present

## 2022-07-17 MED ORDER — DENOSUMAB 60 MG/ML ~~LOC~~ SOSY
60.0000 mg | PREFILLED_SYRINGE | Freq: Once | SUBCUTANEOUS | Status: AC
  Administered 2022-07-17: 60 mg via SUBCUTANEOUS

## 2022-07-17 MED ORDER — DENOSUMAB 60 MG/ML ~~LOC~~ SOSY
PREFILLED_SYRINGE | SUBCUTANEOUS | Status: AC
  Filled 2022-07-17: qty 1

## 2022-07-18 ENCOUNTER — Encounter: Payer: Self-pay | Admitting: Nurse Practitioner

## 2022-07-18 ENCOUNTER — Ambulatory Visit (INDEPENDENT_AMBULATORY_CARE_PROVIDER_SITE_OTHER): Payer: Medicare Other | Admitting: Nurse Practitioner

## 2022-07-18 VITALS — BP 90/60 | HR 70 | Ht 66.0 in | Wt 120.0 lb

## 2022-07-18 DIAGNOSIS — K219 Gastro-esophageal reflux disease without esophagitis: Secondary | ICD-10-CM

## 2022-07-18 DIAGNOSIS — Z86010 Personal history of colonic polyps: Secondary | ICD-10-CM

## 2022-07-18 NOTE — Patient Instructions (Signed)
Recall EGD & Colonoscopy due November 2024 with Dr.Nandigam  Continue Famotidine at bedtime. _______________________________________________________  If your blood pressure at your visit was 140/90 or greater, please contact your primary care physician to follow up on this. _______________________________________________________  If you are age 70 or older, your body mass index should be between 23-30. Your Body mass index is 19.37 kg/m. If this is out of the aforementioned range listed, please consider follow up with your Primary Care Provider.  If you are age 61 or younger, your body mass index should be between 19-25. Your Body mass index is 19.37 kg/m. If this is out of the aformentioned range listed, please consider follow up with your Primary Care Provider.  ________________________________________________________  Thank you for trusting me with your gastrointestinal care!   Alcide Evener, CRNP

## 2022-07-18 NOTE — Progress Notes (Signed)
07/18/2022 Rachel Knight [id number] 1951/01/03   Chief Complaint: GERD follow up  History of Present Illness: Rachel Knight is a 72 year old female with a past medical history of anxiety, depression, hypothyroidism, osteopenia, vitamin D deficiency, Covid 22 Dec 2021, GERD and colon polyps. Past appendectomy, breast augmentation, neck lift and wisdom teeth extraction.  He stated feeling much better.  She denies having any further reflux or throat clearing symptoms since she stopped eating nuts and drinking almond milk as well as initiating allergy shots.  She weaned herself off Omeprazole 3 weeks ago.  She previously noticed Omeprazole resulted in increased abdominal bloat and gas.  She remains on Famotidine 40 mg nightly she continues to tolerate without any difficulty.  She is scheduled to see an ENT in June due to prior throat hoarseness.  Passing normal bowel movements.  No rectal bleeding or black stools.    PAST GI PROCEDURES:    Colonoscopy by Dr. Lavon Paganini 01/21/2016: One 4 mm polyp in the cecum Nonbleeding internal hemorrhoids Examination was otherwise normal - TUBULAR ADENOMA(S). - HIGH GRADE DYSPLASIA IS NOT IDENTIFIED   Colonoscopy 07/20/2005: Normal colonoscopy    Colonoscopy 07/05/1995: Normal colonoscopy   Current Outpatient Medications on File Prior to Visit  Medication Sig Dispense Refill   Cholecalciferol (VITAMIN D3) 2000 units TABS Take 4,000 Units by mouth daily.      denosumab (PROLIA) 60 MG/ML SOSY injection Inject 60 mg into the skin every 6 (six) months.     EPINEPHrine (EPIPEN 2-PAK) 0.3 mg/0.3 mL IJ SOAJ injection Inject 0.3 mg into the muscle as needed for anaphylaxis. 0.3 mL 1   Estradiol 10 MCG TABS vaginal tablet Place one tablet (10 mcg) in the vagina twice weekly. 24 tablet 3   famotidine (PEPCID) 40 MG tablet Take 1 tablet (40 mg total) by mouth at bedtime. 30 tablet 3   ipratropium (ATROVENT) 0.03 % nasal spray Place 2 sprays in each  nostril 2-3 times a day as needed for runny nose/drainage down throat. Caution as this can be drying 30 mL 5   levothyroxine (SYNTHROID) 75 MCG tablet 1 tablet in the morning on an empty stomach Orally Once a day for 90 days     Multiple Vitamins-Minerals (OSTEOPRIME ULTRA) TABS as directed.      valACYclovir (VALTREX) 500 MG tablet Take 1 tablet po prn for flare ups Orally as directed for 30 days     No current facility-administered medications on file prior to visit.   Allergies  Allergen Reactions   Augmentin [Amoxicillin-Pot Clavulanate] Other (See Comments)    GERD, upset stomach   Other Itching    Food sensitivities   Current Medications, Allergies, Past Medical History, Past Surgical History, Family History and Social History were reviewed in [employer] record.  Review of Systems:   Constitutional: Negative for fever, sweats, chills or weight loss.  Respiratory: Negative for shortness of breath.   Cardiovascular: Negative for chest pain, palpitations and leg swelling.  Gastrointestinal: See HPI.  Musculoskeletal: Negative for back pain or muscle aches.  Neurological: Negative for dizziness, headaches or paresthesias.   Physical Exam: BP 90/60   Pulse 70   Ht 5\' 6"  (1.676 m)   Wt 120 lb (54.4 kg)   LMP 06/05/2006 (Approximate)   SpO2 98%   BMI 19.37 kg/m   LMP 06/05/2006 (Approximate)  General: 72 year old female in no acute distress. Head: Normocephalic and atraumatic. Eyes: No scleral icterus. Conjunctiva pink . Ears:  Normal auditory acuity. Mouth: Dentition intact. No ulcers or lesions.  Lungs: Clear throughout to auscultation. Heart: Regular rate and rhythm, no murmur. Abdomen: Soft, nontender and nondistended. No masses or hepatomegaly. Normal bowel sounds x 4 quadrants.  Rectal: Deferred.  Musculoskeletal: Symmetrical with no gross deformities. Extremities: No edema. Neurological: Alert oriented x 4. No focal deficits.  Psychological:  Alert and cooperative. Normal mood and affect  Assessment and Recommendations:  76) 72 year old female with acid reflux/LPR symptoms have resolved.  She was able to wean off Omeprazole.  She remains on Famotidine 40 mg nightly. -Proceed with ENT evaluation -Continue follow-up with allergist -Continue Famotidine 40 mg nightly -I previously discussed scheduling EGD at the time of her next colonoscopy to rule out reflux esophagitis due to her throat clearing/LPR symptoms.  Her reflux symptoms have improved, however, she does wish to pursue an EGD at the time of her colonoscopy 01/2023 -Diet as tolerated -Follow-up as needed  2) History of a 4 mm tubular adenomatous polyp per colonoscopy 01/2016.  Normal colonoscopies in 2007 and 1997.  Father with history of colon polyps.  Paternal grandfather with history of colon cancer. -Colonoscopy 01/2023  EGD/colonoscopy recall letter will be sent to the patient early fall to schedule these procedures 01/2023

## 2022-07-19 NOTE — Progress Notes (Signed)
Reviewed and agree with documentation and assessment and plan. K. Veena Jerran Tappan , MD   

## 2022-07-24 ENCOUNTER — Ambulatory Visit (INDEPENDENT_AMBULATORY_CARE_PROVIDER_SITE_OTHER): Payer: Medicare Other | Admitting: *Deleted

## 2022-07-24 DIAGNOSIS — J309 Allergic rhinitis, unspecified: Secondary | ICD-10-CM

## 2022-07-26 ENCOUNTER — Ambulatory Visit (INDEPENDENT_AMBULATORY_CARE_PROVIDER_SITE_OTHER): Payer: Medicare Other | Admitting: Obstetrics and Gynecology

## 2022-07-26 ENCOUNTER — Other Ambulatory Visit (HOSPITAL_COMMUNITY)
Admission: RE | Admit: 2022-07-26 | Discharge: 2022-07-26 | Disposition: A | Discharge status: Home / Self Care | Payer: Medicare Other | Source: Ambulatory Visit | Attending: Obstetrics and Gynecology | Admitting: Obstetrics and Gynecology

## 2022-07-26 ENCOUNTER — Encounter: Payer: Self-pay | Admitting: Obstetrics and Gynecology

## 2022-07-26 VITALS — BP 110/72 | HR 58 | Ht 66.0 in | Wt 121.0 lb

## 2022-07-26 DIAGNOSIS — N952 Postmenopausal atrophic vaginitis: Secondary | ICD-10-CM

## 2022-07-26 DIAGNOSIS — Z01419 Encounter for gynecological examination (general) (routine) without abnormal findings: Secondary | ICD-10-CM

## 2022-07-26 DIAGNOSIS — Z9189 Other specified personal risk factors, not elsewhere classified: Secondary | ICD-10-CM

## 2022-07-26 DIAGNOSIS — B009 Herpesviral infection, unspecified: Secondary | ICD-10-CM

## 2022-07-26 DIAGNOSIS — Z1151 Encounter for screening for human papillomavirus (HPV): Secondary | ICD-10-CM | POA: Diagnosis not present

## 2022-07-26 DIAGNOSIS — Z124 Encounter for screening for malignant neoplasm of cervix: Secondary | ICD-10-CM

## 2022-07-26 DIAGNOSIS — Z5181 Encounter for therapeutic drug level monitoring: Secondary | ICD-10-CM | POA: Diagnosis not present

## 2022-07-26 MED ORDER — ESTRADIOL 10 MCG VA TABS: ORAL_TABLET | VAGINAL | 11 refills | Status: DC

## 2022-07-26 NOTE — Patient Instructions (Signed)

## 2022-08-02 LAB — CYTOLOGY - PAP
Comment: NEGATIVE
Diagnosis: NEGATIVE
High risk HPV: NEGATIVE

## 2022-08-07 DIAGNOSIS — R09A2 Foreign body sensation, throat: Secondary | ICD-10-CM | POA: Diagnosis not present

## 2022-08-07 DIAGNOSIS — H938X2 Other specified disorders of left ear: Secondary | ICD-10-CM | POA: Diagnosis not present

## 2022-08-14 ENCOUNTER — Ambulatory Visit: Payer: Self-pay | Admitting: *Deleted

## 2022-08-28 ENCOUNTER — Ambulatory Visit: Payer: Medicare Other | Admitting: Family

## 2022-08-28 ENCOUNTER — Ambulatory Visit (INDEPENDENT_AMBULATORY_CARE_PROVIDER_SITE_OTHER): Payer: Medicare Other | Admitting: *Deleted

## 2022-08-28 DIAGNOSIS — J309 Allergic rhinitis, unspecified: Secondary | ICD-10-CM | POA: Diagnosis not present

## 2022-09-05 ENCOUNTER — Ambulatory Visit (INDEPENDENT_AMBULATORY_CARE_PROVIDER_SITE_OTHER): Payer: Medicare Other

## 2022-09-05 DIAGNOSIS — J309 Allergic rhinitis, unspecified: Secondary | ICD-10-CM | POA: Diagnosis not present

## 2022-09-12 ENCOUNTER — Ambulatory Visit: Payer: Self-pay | Admitting: *Deleted

## 2022-09-12 ENCOUNTER — Ambulatory Visit: Payer: Medicare Other | Admitting: Family

## 2022-09-12 DIAGNOSIS — J309 Allergic rhinitis, unspecified: Secondary | ICD-10-CM

## 2022-09-13 DIAGNOSIS — H938X2 Other specified disorders of left ear: Secondary | ICD-10-CM | POA: Diagnosis not present

## 2022-09-18 ENCOUNTER — Ambulatory Visit (INDEPENDENT_AMBULATORY_CARE_PROVIDER_SITE_OTHER): Payer: Medicare Other | Admitting: *Deleted

## 2022-09-18 DIAGNOSIS — J309 Allergic rhinitis, unspecified: Secondary | ICD-10-CM | POA: Diagnosis not present

## 2022-09-25 ENCOUNTER — Ambulatory Visit (INDEPENDENT_AMBULATORY_CARE_PROVIDER_SITE_OTHER): Payer: Medicare Other

## 2022-09-25 DIAGNOSIS — J309 Allergic rhinitis, unspecified: Secondary | ICD-10-CM

## 2022-10-02 ENCOUNTER — Ambulatory Visit: Payer: Self-pay

## 2022-10-02 DIAGNOSIS — J309 Allergic rhinitis, unspecified: Secondary | ICD-10-CM

## 2022-10-11 ENCOUNTER — Ambulatory Visit: Payer: Self-pay

## 2022-10-11 DIAGNOSIS — J309 Allergic rhinitis, unspecified: Secondary | ICD-10-CM | POA: Diagnosis not present

## 2022-10-16 ENCOUNTER — Ambulatory Visit: Payer: Medicare Other | Admitting: Podiatry

## 2022-10-20 ENCOUNTER — Ambulatory Visit (INDEPENDENT_AMBULATORY_CARE_PROVIDER_SITE_OTHER): Payer: Medicare Other | Admitting: Podiatry

## 2022-10-20 ENCOUNTER — Ambulatory Visit (INDEPENDENT_AMBULATORY_CARE_PROVIDER_SITE_OTHER): Payer: Medicare Other

## 2022-10-20 DIAGNOSIS — J309 Allergic rhinitis, unspecified: Secondary | ICD-10-CM

## 2022-10-20 DIAGNOSIS — L600 Ingrowing nail: Secondary | ICD-10-CM

## 2022-10-20 NOTE — Progress Notes (Signed)
Subjective:  Patient ID: Rachel Knight, female    DOB: [birth year],  MRN: [id number]  Chief Complaint  Patient presents with   Nail Problem    Bilateral hallux nail discomfort     72 y.o. female presents with the above complaint.  Patient presents with bilateral hallux medial border ingrown painful to touch is progressive gotten worse worse with ambulation worse with pressure she wanted to discuss treatment options include removal however she is going to the beach over the next few days.  She wanted to hold off.  Denies any other acute complaints   Review of Systems: Negative except as noted in the HPI. Denies N/V/F/Ch.  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   Vitamin D deficiency     Current Outpatient Medications:    Cholecalciferol (VITAMIN D3) 2000 units TABS, Take 4,000 Units by mouth daily. , Disp: , Rfl:    citalopram (CELEXA) 10 MG tablet, Take by mouth., Disp: , Rfl:    denosumab (PROLIA) 60 MG/ML SOSY injection, Inject 60 mg into the skin every 6 (six) months., Disp: , Rfl:    EPINEPHrine (EPIPEN 2-PAK) 0.3 mg/0.3 mL IJ SOAJ injection, Inject 0.3 mg into the muscle as needed for anaphylaxis., Disp: 0.3 mL, Rfl: 1   Estradiol 10 MCG TABS vaginal tablet, Place one tablet (10 mcg) in the vagina twice weekly., Disp: 8 tablet, Rfl: 11   famotidine (PEPCID) 40 MG tablet, Take 1 tablet (40 mg total) by mouth at bedtime., Disp: 30 tablet, Rfl: 3   ipratropium (ATROVENT) 0.03 % nasal spray, Place 2 sprays in each nostril 2-3 times a day as needed for runny nose/drainage down throat. Caution as this can be drying, Disp: 30 mL, Rfl: 2   levothyroxine (SYNTHROID) 75 MCG tablet, 1 tablet in the morning on an empty stomach Orally Once a day for 90 days, Disp: ,  Rfl:    Multiple Vitamins-Minerals (OSTEOPRIME ULTRA) TABS, as directed. , Disp: , Rfl:    valACYclovir (VALTREX) 500 MG tablet, Take 1 tablet po prn for flare ups Orally as directed for 30 days, Disp: , Rfl:   Social History   Tobacco Use  Smoking Status Former   Types: Cigarettes  Smokeless Tobacco Never  Tobacco Comments   only smoked age [age] , 1 ppweek    Allergies  Allergen Reactions   Augmentin [Amoxicillin-Pot Clavulanate] Other (See Comments)    GERD, upset stomach   Other Itching    Food sensitivities   Objective:  There were no vitals filed for this visit. There is no height or weight on file to calculate BMI. Constitutional Well developed. Well nourished.  Vascular Dorsalis pedis pulses palpable bilaterally. Posterior tibial pulses palpable bilaterally. Capillary refill normal to all digits.  No cyanosis or clubbing noted. Pedal hair growth normal.  Neurologic Normal speech. Oriented to person, place, and time. Epicritic sensation to light touch grossly present bilaterally.  Dermatologic Painful ingrowing nail at medial nail borders of the hallux nail bilaterally. No other open wounds. No skin lesions.  Orthopedic: Normal joint ROM without pain or crepitus bilaterally. No visible deformities. No bony tenderness.   Radiographs: None Assessment:   1. Ingrown toenail of right foot  2. Ingrown left big toenail    Plan:  Patient was evaluated and treated and all questions answered.  Ingrown Nail, bilaterally -I discussed with the patient the etiology of ingrown various treatment options were discussed.  She will benefit from removal of the ingrown however given that she is going to the beach I would like to hold off on removing the nail until she has returned from her trip.  She agrees with the plan.  No follow-ups on file.

## 2022-10-24 NOTE — Patient Instructions (Incomplete)
  1.  Allergen avoidance measures - dust mite  2.  Treat and prevent reflux/LPR: (see avoidance measures below)  A. Famotidine 40 mg - 1 tablet in evening.   B. Replace throat clearing with swallowing/drinking maneuver  C. Continue to follow up with GI as needed  F. Avoid foods that cause the  globus/phlegm sensation that comes up from your chest ( wheat and dairy).            3.  If needed:   A.  Ipratropium 0.03% -2 sprays each nostril 2-3 times a day as needed for runny nose/drainage down throat. Caution as this can be drying  Continue allergy injections per protocol and have access to epinephrine auto injector device.  4.  Return to clinic in  months or earlier if problem  Lifestyle Changes for Controlling GERD When you have GERD, stomach acid feels as if it's backing up toward your mouth. Whether or not you take medication to control your GERD, your symptoms can often be improved with lifestyle changes.   Raise Your Head Reflux is more likely to strike when you're lying down flat, because stomach fluid can flow backward more easily. Raising the head of your bed 4-6 inches can help. To do this: Slide blocks or books under the legs at the head of your bed. Or, place a wedge under the mattress. Many foam stores can make a suitable wedge for you. The wedge should run from your waist to the top of your head. Don't just prop your head on several pillows. This increases pressure on your stomach. It can make GERD worse.  Watch Your Eating Habits Certain foods may increase the acid in your stomach or relax the lower esophageal sphincter, making GERD more likely. It's best to avoid the following: Coffee, tea, and carbonated drinks (with and without caffeine) Fatty, fried, or spicy food Mint, chocolate, onions, and tomatoes Any other foods that seem to irritate your stomach or cause you pain  Relieve the Pressure Eat smaller meals, even if you have to eat more often. Don't lie down  right after you eat. Wait a few hours for your stomach to empty. Avoid tight belts and tight-fitting clothes. Lose excess weight.  Tobacco and Alcohol

## 2022-10-25 ENCOUNTER — Ambulatory Visit (INDEPENDENT_AMBULATORY_CARE_PROVIDER_SITE_OTHER): Payer: Medicare Other | Admitting: Family

## 2022-10-25 ENCOUNTER — Encounter: Payer: Self-pay | Admitting: Family

## 2022-10-25 ENCOUNTER — Other Ambulatory Visit: Payer: Self-pay

## 2022-10-25 VITALS — BP 110/60 | HR 62 | Temp 97.9°F | Resp 12

## 2022-10-25 DIAGNOSIS — J3089 Other allergic rhinitis: Secondary | ICD-10-CM | POA: Diagnosis not present

## 2022-10-25 DIAGNOSIS — K219 Gastro-esophageal reflux disease without esophagitis: Secondary | ICD-10-CM

## 2022-10-25 DIAGNOSIS — R09A2 Foreign body sensation, throat: Secondary | ICD-10-CM | POA: Diagnosis not present

## 2022-10-25 DIAGNOSIS — M545 Low back pain, unspecified: Secondary | ICD-10-CM | POA: Diagnosis not present

## 2022-10-25 DIAGNOSIS — M549 Dorsalgia, unspecified: Secondary | ICD-10-CM | POA: Diagnosis not present

## 2022-10-25 MED ORDER — IPRATROPIUM BROMIDE 0.03 % NA SOLN: NASAL | 2 refills | Status: DC

## 2022-10-25 NOTE — Progress Notes (Signed)
[address] [place] [address] Dept: [phone]  FOLLOW UP NOTE  Patient ID: Rachel Knight, female    DOB: April 08, 1950  Age: 72 y.o. MRN: [id number] Date of Office Visit: 10/25/2022  Assessment  Chief Complaint: Follow-up (Patient never picked up the epi-pen because she thinks she does not need it.)  HPI Rachel Knight is a 71 year old female who presents today for follow-up of perennial allergic rhinitis, laryngopharyngeal reflux disease, and globus sensation.  She was last seen on May 24, 2022 by myself.  She denies any new diagnosis or surgery since her last office visit.  Perennial allergic rhinitis: She continues to use Atrovent nasal spray 0.03% 2 sprays each nostril once a day.  She reports postnasal drip and a voice that is deeper as she has gotten older.  She will also have clear rhinorrhea.  She denies nasal congestion.  She has not had any sinus infections since we last saw her.  She does continue to receive allergy injections per protocol.  She does feel like her allergy injections have helped.  She denies any large local reactions at the injection site.  She does mention that she did not ever pick up her epinephrine autoinjector device due to cost.  She also felt like she would not need it if she waited her 30 minutes here in the office.  Discussed the risk of anaphylaxis with allergy injections and recommended picking up epinephrine autoinjector device.  She did see ear nose and throat, Dr. Aleene Davidson, on August 07, 2022 for left ear fullness.  She was referred to ENT for globus sensation.  Laryngopharyngeal reflux disease: She reports that she is no longer taking Pepcid.  She has noticed that if she eats better that she will not have the mucus that comes up into her throat.  She is trying to stop drinking alcohol.  She is unable to quantify how frequently her symptoms of reflux are occurring, but mentions if she does well with her diet she is not symptomatic.  She does  mention that she probably should go back on Pepcid.  She was previously on omeprazole, but this made her symptoms feel worse.  Since her last office visit she did see GI as recommended at her last office visit.  At that office visit with GI on 07/18/2022 she reported that she was not having any further reflux or throat clearing symptoms since stop eating nuts, drinking almond milk, as well as initiating allergy shots.  At that office visit it was discussed about possibly scheduling an EGD to rule out reflux esophagitis due to her throat clearing/LPR symptoms.  She does not wish to pursue the EGD at this time.     Drug Allergies:  Allergies  Allergen Reactions   Augmentin [Amoxicillin-Pot Clavulanate] Other (See Comments)    GERD, upset stomach   Other Itching    Food sensitivities    Review of Systems: Negative except as per HPI   Physical Exam: BP 110/60   Pulse 62   Temp 97.9 F (36.6 C) (Temporal)   Resp 12   LMP 06/05/2006 (Approximate)   SpO2 98%    Physical Exam Constitutional:      Appearance: Normal appearance.  HENT:     Head: Normocephalic and atraumatic.     Comments: Pharynx normal, eyes normal, ears normal, nose normal    Right Ear: Tympanic membrane, ear canal and external ear normal.     Left Ear: Tympanic membrane, ear canal and external  ear normal.     Nose: Nose normal.     Mouth/Throat:     Mouth: Mucous membranes are moist.     Pharynx: Oropharynx is clear.  Eyes:     Conjunctiva/sclera: Conjunctivae normal.  Cardiovascular:     Rate and Rhythm: Regular rhythm.     Heart sounds: Normal heart sounds.  Pulmonary:     Effort: Pulmonary effort is normal.     Breath sounds: Normal breath sounds.     Comments: Lungs clear to auscultation Musculoskeletal:     Cervical back: Neck supple.  Skin:    General: Skin is warm.  Neurological:     Mental Status: She is alert and oriented to person, place, and time.  Psychiatric:        Mood and Affect: Mood  normal.        Behavior: Behavior normal.        Thought Content: Thought content normal.        Judgment: Judgment normal.     Diagnostics: None  Assessment and Plan: 1. Perennial allergic rhinitis   2. LPRD (laryngopharyngeal reflux disease)   3. Globus sensation     Meds ordered this encounter  Medications   ipratropium (ATROVENT) 0.03 % nasal spray    Sig: Place 2 sprays in each nostril 2-3 times a day as needed for runny nose/drainage down throat. Caution as this can be drying    Dispense:  30 mL    Refill:  2    Patient Instructions   1.  Allergen avoidance measures - dust mite  2.  Treat and prevent reflux/LPR: (see avoidance measures below)  A.  Start  Pepcid famotidine 40 mg - 1 tablet in evening.   B. Replace throat clearing with swallowing/drinking maneuver  C. Continue to follow up with GI as needed  F. Avoid foods that cause the  globus/phlegm sensation that comes up from your chest ( wheat and dairy).            3.  If needed:   A.  Ipratropium 0.03% -2 sprays each nostril 2-3 times a day as needed for runny nose/drainage down throat. Caution as this can be drying  Continue allergy injections per protocol and have access to epinephrine auto injector device. Recommend picking up EpiPen  4.  Return to clinic in 4-6 months or earlier if problem  Lifestyle Changes for Controlling GERD When you have GERD, stomach acid feels as if it's backing up toward your mouth. Whether or not you take medication to control your GERD, your symptoms can often be improved with lifestyle changes.   Raise Your Head Reflux is more likely to strike when you're lying down flat, because stomach fluid can flow backward more easily. Raising the head of your bed 4-6 inches can help. To do this: Slide blocks or books under the legs at the head of your bed. Or, place a wedge under the mattress. Many foam stores can make a suitable wedge for you. The wedge should run from your waist  to the top of your head. Don't just prop your head on several pillows. This increases pressure on your stomach. It can make GERD worse.  Watch Your Eating Habits Certain foods may increase the acid in your stomach or relax the lower esophageal sphincter, making GERD more likely. It's best to avoid the following: Coffee, tea, and carbonated drinks (with and without caffeine) Fatty, fried, or spicy food Mint, chocolate, onions, and tomatoes Any other foods  that seem to irritate your stomach or cause you pain  Relieve the Pressure Eat smaller meals, even if you have to eat more often. Don't lie down right after you eat. Wait a few hours for your stomach to empty. Avoid tight belts and tight-fitting clothes. Lose excess weight.  Tobacco and Alcohol  Return in about 4 months (around 02/24/2023), or if symptoms worsen or fail to improve.    Thank you for the opportunity to care for this patient.  Please do not hesitate to contact me with questions.  Nehemiah Settle, FNP [facility]

## 2022-10-26 ENCOUNTER — Encounter: Payer: Self-pay | Admitting: Allergy and Immunology

## 2022-11-08 ENCOUNTER — Ambulatory Visit (INDEPENDENT_AMBULATORY_CARE_PROVIDER_SITE_OTHER): Payer: Medicare Other | Admitting: *Deleted

## 2022-11-08 DIAGNOSIS — J309 Allergic rhinitis, unspecified: Secondary | ICD-10-CM

## 2022-11-08 DIAGNOSIS — M549 Dorsalgia, unspecified: Secondary | ICD-10-CM | POA: Diagnosis not present

## 2022-11-15 ENCOUNTER — Ambulatory Visit (INDEPENDENT_AMBULATORY_CARE_PROVIDER_SITE_OTHER): Payer: Medicare Other | Admitting: Podiatry

## 2022-11-15 DIAGNOSIS — L600 Ingrowing nail: Secondary | ICD-10-CM | POA: Diagnosis not present

## 2022-11-15 NOTE — Progress Notes (Signed)
Subjective:  Patient ID: Rachel Knight, female    DOB: 1951/01/18,  MRN: [id number]  Chief Complaint  Patient presents with   Ingrown Toenail    72 y.o. female presents with the above complaint.  Patient presents with bilateral medial border of the hallux ingrown painful to touch is progressive and worse worse with ambulation worse with pressure she states she has not gotten relief since her last visit she would like to have the removed denies any other acute complaints pain scale is 5 out of 10 dull aching nature   Review of Systems: Negative except as noted in the HPI. Denies N/V/F/Ch.  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   Vitamin D deficiency     Current Outpatient Medications:    Cholecalciferol (VITAMIN D3) 2000 units TABS, Take 4,000 Units by mouth daily. , Disp: , Rfl:    citalopram (CELEXA) 10 MG tablet, Take by mouth., Disp: , Rfl:    denosumab (PROLIA) 60 MG/ML SOSY injection, Inject 60 mg into the skin every 6 (six) months., Disp: , Rfl:    EPINEPHrine (EPIPEN 2-PAK) 0.3 mg/0.3 mL IJ SOAJ injection, Inject 0.3 mg into the muscle as needed for anaphylaxis., Disp: 0.3 mL, Rfl: 1   Estradiol 10 MCG TABS vaginal tablet, Place one tablet (10 mcg) in the vagina twice weekly., Disp: 8 tablet, Rfl: 11   famotidine (PEPCID) 40 MG tablet, Take 1 tablet (40 mg total) by mouth at bedtime., Disp: 30 tablet, Rfl: 3   ipratropium (ATROVENT) 0.03 % nasal spray, Place 2 sprays in each nostril 2-3 times a day as needed for runny nose/drainage down throat. Caution as this can be drying, Disp: 30 mL, Rfl: 2   levothyroxine (SYNTHROID) 75 MCG tablet, 1 tablet in the morning on an empty stomach Orally Once a day for 90 days, Disp: , Rfl:    Multiple Vitamins-Minerals  (OSTEOPRIME ULTRA) TABS, as directed. , Disp: , Rfl:    valACYclovir (VALTREX) 500 MG tablet, Take 1 tablet po prn for flare ups Orally as directed for 30 days, Disp: , Rfl:   Social History   Tobacco Use  Smoking Status Former   Types: Cigarettes  Smokeless Tobacco Never  Tobacco Comments   only smoked age [age] , 1 ppweek    Allergies  Allergen Reactions   Augmentin [Amoxicillin-Pot Clavulanate] Other (See Comments)    GERD, upset stomach   Other Itching    Food sensitivities   Objective:  There were no vitals filed for this visit. There is no height or weight on file to calculate BMI. Constitutional Well developed. Well nourished.  Vascular Dorsalis pedis pulses palpable bilaterally. Posterior tibial pulses palpable bilaterally. Capillary refill normal to all digits.  No cyanosis or clubbing noted. Pedal hair growth normal.  Neurologic Normal speech. Oriented to person, place, and time. Epicritic sensation to light touch grossly present bilaterally.  Dermatologic Painful ingrowing nail at medial nail borders of the hallux nail bilaterally. No other open wounds. No skin lesions.  Orthopedic: Normal joint ROM without pain or crepitus bilaterally. No visible deformities. No bony tenderness.   Radiographs: None Assessment:   1. Ingrown toenail of right foot   2. Ingrown left big toenail  Plan:  Patient was evaluated and treated and all questions answered.  Ingrown Nail, bilaterally -Patient elects to proceed with minor surgery to remove ingrown toenail removal today. Consent reviewed and signed by patient. -Ingrown nail excised. See procedure note. -Educated on post-procedure care including soaking. Written instructions provided and reviewed. -Patient to follow up in 2 weeks for nail check.  Procedure: Excision of Ingrown Toenail Location: Bilateral 1st toe medial nail borders. Anesthesia: Lidocaine 1% plain; 1.5 mL and Marcaine 0.5% plain; 1.5 mL, digital  block. Skin Prep: Betadine. Dressing: Silvadene; telfa; dry, sterile, compression dressing. Technique: Following skin prep, the toe was exsanguinated and a tourniquet was secured at the base of the toe. The affected nail border was freed, split with a nail splitter, and excised. Chemical matrixectomy was then performed with phenol and irrigated out with alcohol. The tourniquet was then removed and sterile dressing applied. Disposition: Patient tolerated procedure well. Patient to return in 2 weeks for follow-up.   No follow-ups on file.

## 2022-11-17 ENCOUNTER — Ambulatory Visit (INDEPENDENT_AMBULATORY_CARE_PROVIDER_SITE_OTHER): Payer: Medicare Other

## 2022-11-17 DIAGNOSIS — J309 Allergic rhinitis, unspecified: Secondary | ICD-10-CM

## 2022-11-27 DIAGNOSIS — R002 Palpitations: Secondary | ICD-10-CM | POA: Diagnosis not present

## 2022-11-27 DIAGNOSIS — E039 Hypothyroidism, unspecified: Secondary | ICD-10-CM | POA: Diagnosis not present

## 2022-11-28 ENCOUNTER — Ambulatory Visit (INDEPENDENT_AMBULATORY_CARE_PROVIDER_SITE_OTHER): Payer: Self-pay

## 2022-11-28 DIAGNOSIS — J309 Allergic rhinitis, unspecified: Secondary | ICD-10-CM | POA: Diagnosis not present

## 2022-12-05 ENCOUNTER — Ambulatory Visit (INDEPENDENT_AMBULATORY_CARE_PROVIDER_SITE_OTHER): Payer: Medicare Other | Admitting: *Deleted

## 2022-12-05 DIAGNOSIS — J309 Allergic rhinitis, unspecified: Secondary | ICD-10-CM | POA: Diagnosis not present

## 2022-12-09 DIAGNOSIS — Z23 Encounter for immunization: Secondary | ICD-10-CM | POA: Diagnosis not present

## 2022-12-14 ENCOUNTER — Ambulatory Visit (INDEPENDENT_AMBULATORY_CARE_PROVIDER_SITE_OTHER): Payer: Medicare Other

## 2022-12-14 ENCOUNTER — Other Ambulatory Visit: Payer: Self-pay | Admitting: Medical Genetics

## 2022-12-14 DIAGNOSIS — J309 Allergic rhinitis, unspecified: Secondary | ICD-10-CM

## 2022-12-14 DIAGNOSIS — Z006 Encounter for examination for normal comparison and control in clinical research program: Secondary | ICD-10-CM

## 2022-12-20 DIAGNOSIS — Z818 Family history of other mental and behavioral disorders: Secondary | ICD-10-CM | POA: Diagnosis not present

## 2022-12-20 DIAGNOSIS — Z148 Genetic carrier of other disease: Secondary | ICD-10-CM | POA: Diagnosis not present

## 2022-12-20 DIAGNOSIS — F419 Anxiety disorder, unspecified: Secondary | ICD-10-CM | POA: Diagnosis not present

## 2022-12-20 DIAGNOSIS — R7989 Other specified abnormal findings of blood chemistry: Secondary | ICD-10-CM | POA: Diagnosis not present

## 2022-12-20 DIAGNOSIS — R002 Palpitations: Secondary | ICD-10-CM | POA: Diagnosis not present

## 2022-12-20 DIAGNOSIS — E781 Pure hyperglyceridemia: Secondary | ICD-10-CM | POA: Diagnosis not present

## 2022-12-20 DIAGNOSIS — B009 Herpesviral infection, unspecified: Secondary | ICD-10-CM | POA: Diagnosis not present

## 2022-12-20 DIAGNOSIS — L659 Nonscarring hair loss, unspecified: Secondary | ICD-10-CM | POA: Diagnosis not present

## 2022-12-20 DIAGNOSIS — M810 Age-related osteoporosis without current pathological fracture: Secondary | ICD-10-CM | POA: Diagnosis not present

## 2022-12-20 DIAGNOSIS — E039 Hypothyroidism, unspecified: Secondary | ICD-10-CM | POA: Diagnosis not present

## 2022-12-20 DIAGNOSIS — R053 Chronic cough: Secondary | ICD-10-CM | POA: Diagnosis not present

## 2022-12-25 ENCOUNTER — Ambulatory Visit (INDEPENDENT_AMBULATORY_CARE_PROVIDER_SITE_OTHER): Payer: Medicare Other

## 2022-12-25 DIAGNOSIS — J309 Allergic rhinitis, unspecified: Secondary | ICD-10-CM

## 2022-12-28 ENCOUNTER — Encounter: Payer: Self-pay | Admitting: Gastroenterology

## 2023-01-11 ENCOUNTER — Ambulatory Visit (INDEPENDENT_AMBULATORY_CARE_PROVIDER_SITE_OTHER): Payer: Medicare Other

## 2023-01-11 DIAGNOSIS — J309 Allergic rhinitis, unspecified: Secondary | ICD-10-CM

## 2023-01-17 ENCOUNTER — Other Ambulatory Visit (HOSPITAL_COMMUNITY): Payer: Self-pay | Admitting: *Deleted

## 2023-01-18 ENCOUNTER — Ambulatory Visit (HOSPITAL_COMMUNITY)
Admission: RE | Admit: 2023-01-18 | Discharge: 2023-01-18 | Disposition: A | Discharge status: Home / Self Care | Payer: Medicare Other | Source: Ambulatory Visit | Attending: Internal Medicine | Admitting: Internal Medicine

## 2023-01-18 DIAGNOSIS — M810 Age-related osteoporosis without current pathological fracture: Secondary | ICD-10-CM | POA: Diagnosis not present

## 2023-01-18 MED ORDER — DENOSUMAB 60 MG/ML ~~LOC~~ SOSY: 60.0000 mg | PREFILLED_SYRINGE | Freq: Once | SUBCUTANEOUS | Status: AC

## 2023-01-18 MED ORDER — DENOSUMAB 60 MG/ML ~~LOC~~ SOSY
PREFILLED_SYRINGE | SUBCUTANEOUS | Status: AC
  Administered 2023-01-18: 60 mg via SUBCUTANEOUS
  Filled 2023-01-18: qty 1

## 2023-01-25 ENCOUNTER — Ambulatory Visit (INDEPENDENT_AMBULATORY_CARE_PROVIDER_SITE_OTHER): Payer: Medicare Other

## 2023-01-25 DIAGNOSIS — J309 Allergic rhinitis, unspecified: Secondary | ICD-10-CM | POA: Diagnosis not present

## 2023-02-05 ENCOUNTER — Ambulatory Visit (INDEPENDENT_AMBULATORY_CARE_PROVIDER_SITE_OTHER): Payer: Self-pay | Admitting: *Deleted

## 2023-02-05 DIAGNOSIS — J309 Allergic rhinitis, unspecified: Secondary | ICD-10-CM | POA: Diagnosis not present

## 2023-02-05 DIAGNOSIS — H2513 Age-related nuclear cataract, bilateral: Secondary | ICD-10-CM | POA: Diagnosis not present

## 2023-02-12 ENCOUNTER — Ambulatory Visit (INDEPENDENT_AMBULATORY_CARE_PROVIDER_SITE_OTHER): Payer: Medicare Other | Admitting: *Deleted

## 2023-02-12 DIAGNOSIS — J309 Allergic rhinitis, unspecified: Secondary | ICD-10-CM | POA: Diagnosis not present

## 2023-02-19 ENCOUNTER — Ambulatory Visit: Payer: Medicare Other

## 2023-02-19 VITALS — Ht 66.0 in | Wt 120.0 lb

## 2023-02-19 DIAGNOSIS — Z860100 Personal history of colon polyps, unspecified: Secondary | ICD-10-CM

## 2023-02-19 MED ORDER — NA SULFATE-K SULFATE-MG SULF 17.5-3.13-1.6 GM/177ML PO SOLN: 1.0000 | Freq: Once | ORAL | 0 refills | Status: AC

## 2023-02-19 NOTE — Progress Notes (Signed)

## 2023-02-26 ENCOUNTER — Ambulatory Visit (INDEPENDENT_AMBULATORY_CARE_PROVIDER_SITE_OTHER): Payer: Self-pay

## 2023-02-26 DIAGNOSIS — J309 Allergic rhinitis, unspecified: Secondary | ICD-10-CM | POA: Diagnosis not present

## 2023-03-08 ENCOUNTER — Ambulatory Visit (INDEPENDENT_AMBULATORY_CARE_PROVIDER_SITE_OTHER): Payer: Medicare Other

## 2023-03-08 DIAGNOSIS — J309 Allergic rhinitis, unspecified: Secondary | ICD-10-CM

## 2023-03-19 ENCOUNTER — Ambulatory Visit (INDEPENDENT_AMBULATORY_CARE_PROVIDER_SITE_OTHER): Payer: Self-pay | Admitting: *Deleted

## 2023-03-19 DIAGNOSIS — J309 Allergic rhinitis, unspecified: Secondary | ICD-10-CM

## 2023-03-22 ENCOUNTER — Encounter: Payer: BLUE CROSS/BLUE SHIELD | Admitting: Gastroenterology

## 2023-03-28 ENCOUNTER — Ambulatory Visit (INDEPENDENT_AMBULATORY_CARE_PROVIDER_SITE_OTHER): Payer: Self-pay | Admitting: *Deleted

## 2023-03-28 DIAGNOSIS — J309 Allergic rhinitis, unspecified: Secondary | ICD-10-CM

## 2023-04-06 ENCOUNTER — Ambulatory Visit (INDEPENDENT_AMBULATORY_CARE_PROVIDER_SITE_OTHER): Payer: Medicare Other | Admitting: *Deleted

## 2023-04-06 DIAGNOSIS — J309 Allergic rhinitis, unspecified: Secondary | ICD-10-CM

## 2023-04-10 ENCOUNTER — Encounter: Payer: Self-pay | Admitting: Internal Medicine

## 2023-04-10 ENCOUNTER — Ambulatory Visit (INDEPENDENT_AMBULATORY_CARE_PROVIDER_SITE_OTHER): Payer: Medicare Other | Admitting: Internal Medicine

## 2023-04-10 ENCOUNTER — Other Ambulatory Visit: Payer: Self-pay

## 2023-04-10 VITALS — BP 100/62 | HR 72 | Temp 98.1°F | Ht 65.1 in | Wt 126.4 lb

## 2023-04-10 DIAGNOSIS — K219 Gastro-esophageal reflux disease without esophagitis: Secondary | ICD-10-CM

## 2023-04-10 DIAGNOSIS — J3089 Other allergic rhinitis: Secondary | ICD-10-CM

## 2023-04-10 MED ORDER — IPRATROPIUM BROMIDE 0.06 % NA SOLN: 2.0000 | Freq: Three times a day (TID) | NASAL | 5 refills | Status: DC | PRN

## 2023-04-10 MED ORDER — EPINEPHRINE 0.3 MG/0.3ML IJ SOAJ: 0.3000 mg | INTRAMUSCULAR | 1 refills | Status: AC | PRN

## 2023-04-10 NOTE — Patient Instructions (Addendum)
  1.  Allergen avoidance measures - dust mite  2.  Treat and prevent reflux/LPR: (see avoidance measures below)  Replace throat clearing with swallowing/drinking maneuver.     Limit foods that cause the  globus/phlegm sensation that comes up from your chest ( wheat and dairy).         Continue to follow up with GI as needed     3.  If needed:  Sinus saline rinses with distilled water and NeilMed Sinus Rinse bottle and saline packets.      Ipratropium 0.06% -2 sprays each nostril 2-3 times a day as needed for runny nose/drainage down throat. This can be drying.  4. Continue allergy  shots on schedule.  Bring Epipen .

## 2023-04-10 NOTE — Progress Notes (Signed)
   FOLLOW UP Date of Service/Encounter:  04/10/23   Subjective:  Rachel Knight (DOB: Mar 27, 1950) is a 73 y.o. female who [facility] for follow up for LPR/globus sensation and allergic rhinitis.   History obtained from: chart review and patient. Last visit was on 10/25/2022 with Wanda Craze and at the time, discussed use of Pepcid  for LPR and follow up with GI. Also on AIT for AR to dust mites and PRN Atrovent .  Reports shots are going fine.  Sometimes gets itchy and redness at the site but no other symptoms.  She still has a lot of rhinorrhea and throat clearing.  She isn't sure if shots are helping. Initiated 06/19/2022.  Red vial started 1/22/205.  Atrovent  nose spray does help dry up the excessive drainage and runny nose.  She is not taking Pepcid  or PPI as they were not helping.  Feels that dietary avoidance works best for her; tries to limit dairy/wheat and also for reflux, avoids chocolate/alcohol /fatty foods.   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   Vitamin D  deficiency     Objective:  BP 100/62 (BP Location: Left Arm, Patient Position: Sitting, Cuff Size: Normal)   Pulse 72   Temp 98.1 F (36.7 C) (Temporal)   Ht 5' 5.1 (1.654 m)   Wt 126 lb 6.4 oz (57.3 kg)   LMP 06/05/2006 (Approximate)   SpO2 97%   BMI 20.97 kg/m  Body mass index is 20.97 kg/m. Physical Exam: GEN: alert, well developed HEENT: clear conjunctiva, nose without inferior turbinate hypertrophy, pink nasal mucosa, + clear rhinorrhea, + cobblestoning HEART: regular rate and rhythm, no murmur LUNGS: clear to auscultation bilaterally, no coughing, unlabored respiration SKIN: no rashes or lesions   Assessment:    1. Perennial allergic rhinitis   2. LPRD (laryngopharyngeal reflux disease)     Plan/Recommendations:  Uncontrolled rhinitis but responsive to Atrovent  nose spray.  Discussed continuation of use. Reached red vial of AIT recently so I would recommend continuing for at least 6 months to 1 year on that to see if there is benefit before she stops AIT.  Not much improvement with PPI or Pepcid  but doing better with dietary avoidance.   1.  Allergen avoidance measures - dust mite  2.  Treat and prevent reflux/LPR: (see avoidance measures below)  Replace throat clearing with swallowing/drinking maneuver.     Limit foods that cause the  globus/phlegm sensation that comes up from your chest ( wheat and dairy).         Continue to follow up with GI as needed     3.  If needed:  Sinus saline rinses with distilled water and NeilMed Sinus Rinse bottle and saline packets.      Ipratropium 0.06% -2 sprays each nostril 2-3 times a day as needed for runny nose/drainage down throat. This can be drying.  4. Continue allergy  shots on schedule.  Bring Epipen .      Return in about 6 months (around 10/08/2023).  Arleta Blanch, MD [facility]

## 2023-04-12 ENCOUNTER — Encounter: Payer: Self-pay | Admitting: Gastroenterology

## 2023-04-13 ENCOUNTER — Other Ambulatory Visit: Payer: Self-pay

## 2023-04-13 ENCOUNTER — Ambulatory Visit (INDEPENDENT_AMBULATORY_CARE_PROVIDER_SITE_OTHER): Payer: Self-pay | Admitting: *Deleted

## 2023-04-13 DIAGNOSIS — J309 Allergic rhinitis, unspecified: Secondary | ICD-10-CM

## 2023-04-13 MED ORDER — NA SULFATE-K SULFATE-MG SULF 17.5-3.13-1.6 GM/177ML PO SOLN: ORAL | 0 refills | Status: DC

## 2023-04-18 DIAGNOSIS — J3089 Other allergic rhinitis: Secondary | ICD-10-CM | POA: Diagnosis not present

## 2023-04-18 NOTE — Progress Notes (Signed)
VIAL MADE 04-18-23

## 2023-04-19 ENCOUNTER — Encounter: Payer: Self-pay | Admitting: Gastroenterology

## 2023-04-19 ENCOUNTER — Ambulatory Visit: Payer: Medicare Other | Admitting: Gastroenterology

## 2023-04-19 VITALS — BP 120/68 | HR 61 | Temp 97.1°F | Resp 11 | Ht 66.0 in | Wt 120.0 lb

## 2023-04-19 DIAGNOSIS — F32A Depression, unspecified: Secondary | ICD-10-CM | POA: Diagnosis not present

## 2023-04-19 DIAGNOSIS — E039 Hypothyroidism, unspecified: Secondary | ICD-10-CM | POA: Diagnosis not present

## 2023-04-19 DIAGNOSIS — Z860100 Personal history of colon polyps, unspecified: Secondary | ICD-10-CM

## 2023-04-19 DIAGNOSIS — K5730 Diverticulosis of large intestine without perforation or abscess without bleeding: Secondary | ICD-10-CM

## 2023-04-19 DIAGNOSIS — K648 Other hemorrhoids: Secondary | ICD-10-CM | POA: Diagnosis not present

## 2023-04-19 DIAGNOSIS — Z860101 Personal history of adenomatous and serrated colon polyps: Secondary | ICD-10-CM | POA: Diagnosis not present

## 2023-04-19 DIAGNOSIS — Z800 Family history of malignant neoplasm of digestive organs: Secondary | ICD-10-CM | POA: Diagnosis not present

## 2023-04-19 DIAGNOSIS — Z1211 Encounter for screening for malignant neoplasm of colon: Secondary | ICD-10-CM | POA: Diagnosis not present

## 2023-04-19 DIAGNOSIS — F419 Anxiety disorder, unspecified: Secondary | ICD-10-CM | POA: Diagnosis not present

## 2023-04-19 MED ORDER — FAMOTIDINE 20 MG PO TABS: 20.0000 mg | ORAL_TABLET | Freq: Two times a day (BID) | ORAL | 1 refills | Status: DC | PRN

## 2023-04-19 MED ORDER — SODIUM CHLORIDE 0.9 % IV SOLN: 500.0000 mL | Freq: Once | INTRAVENOUS | Status: DC

## 2023-04-19 NOTE — Progress Notes (Signed)
Pt's states no medical or surgical changes since previsit or office visit.

## 2023-04-19 NOTE — Progress Notes (Signed)
Sedate, gd SR, tolerated procedure well, VSS, report to RN

## 2023-04-19 NOTE — Op Note (Signed)
[facility] Patient Name: Trude Cansler Procedure Date: 04/19/2023 9:50 AM MRN: [id number] Endoscopist: Napoleon Form , MD, [id number] Age: 73 Referring MD:  Date of Birth: Mar 31, 1950 Gender: Female Account #: [id number] Procedure:                Colonoscopy Indications:              Screening for colon cancer: Family history of                            colorectal cancer in distant relative(s) 60 or                            older, High risk colon cancer surveillance:                            Personal history of adenoma less than 10 mm in size Medicines:                Monitored Anesthesia Care Procedure:                Pre-Anesthesia Assessment:                           - Prior to the procedure, a History and Physical                            was performed, and patient medications and                            allergies were reviewed. The patient's tolerance of                            previous anesthesia was also reviewed. The risks                            and benefits of the procedure and the sedation                            options and risks were discussed with the patient.                            All questions were answered, and informed consent                            was obtained. Prior Anticoagulants: The patient has                            taken no anticoagulant or antiplatelet agents. ASA                            Grade Assessment: I - A normal, healthy patient.                            After reviewing the risks and benefits, the patient  was deemed in satisfactory condition to undergo the                            procedure.                           After obtaining informed consent, the colonoscope                            was passed under direct vision. Throughout the                            procedure, the patient's blood pressure, pulse, and                            oxygen saturations were  monitored continuously. The                            PCF-HQ190L Colonoscope [id number] was introduced                            through the anus and advanced to the the cecum,                            identified by appendiceal orifice and ileocecal                            valve. The colonoscopy was performed without                            difficulty. The patient tolerated the procedure                            well. The quality of the bowel preparation was                            good. The ileocecal valve, appendiceal orifice, and                            rectum were photographed. Scope In: 9:59:11 AM Scope Out: 10:17:12 AM Scope Withdrawal Time: 0 hours 10 minutes 57 seconds  Total Procedure Duration: 0 hours 18 minutes 1 second  Findings:                 The perianal and digital rectal examinations were                            normal.                           A few small-mouthed diverticula were found in the                            sigmoid colon.  Non-bleeding internal hemorrhoids were found during                            retroflexion. The hemorrhoids were small.                           The exam was otherwise without abnormality. Complications:            No immediate complications. Estimated Blood Loss:     Estimated blood loss: none. Impression:               - Diverticulosis in the sigmoid colon.                           - Non-bleeding internal hemorrhoids.                           - The examination was otherwise normal.                           - No specimens collected. Recommendation:           - Patient has a contact number available for                            emergencies. The signs and symptoms of potential                            delayed complications were discussed with the                            patient. Return to normal activities tomorrow.                            Written discharge instructions were  provided to the                            patient.                           - Resume previous diet.                           - Continue present medications.                           - No repeat colonoscopy due to the absence of                            colonic polyps.                           - Return to GI clinic PRN.                           - Use Famotidine 20mg  BID as needed for GERD  symptoms and follow antireflux measures Napoleon Form, MD 04/19/2023 10:22:26 AM This report has been signed electronically.

## 2023-04-19 NOTE — Patient Instructions (Addendum)
Resume previous diet Continue present medications Use Famotidine (New Rx sent) 20 mg twice daily as needed for GERD(heartburn) symptoms( see handout for GERD measures to follow) Return to GI office as needed  There were no colon polyps seen today!  You will NOT need another screening colonoscopy!! HOWEVER, please call in the future [phone] if you have a change in bowel habits, change in family history of colo-rectal cancer, rectal bleeding or other GI concern!.  Handouts/information given for GERD, Famotidine, diverticulosis and hemorrhoids  YOU HAD AN ENDOSCOPIC PROCEDURE TODAY AT THE [facility]:   Refer to the procedure report that was given to you for any specific questions about what was found during the examination.  If the procedure report does not answer your questions, please call your gastroenterologist to clarify.  If you requested that your care partner not be given the details of your procedure findings, then the procedure report has been included in a sealed envelope for you to review at your convenience later.  YOU SHOULD EXPECT: Some feelings of bloating in the abdomen. Passage of more gas than usual.  Walking can help get rid of the air that was put into your GI tract during the procedure and reduce the bloating. If you had a lower endoscopy (such as a colonoscopy or flexible sigmoidoscopy) you may notice spotting of blood in your stool or on the toilet paper. If you underwent a bowel prep for your procedure, you may not have a normal bowel movement for a few days.  Please Note:  You might notice some irritation and congestion in your nose or some drainage.  This is from the oxygen used during your procedure.  There is no need for concern and it should clear up in a day or so.  SYMPTOMS TO REPORT IMMEDIATELY:  Following lower endoscopy (colonoscopy):  Excessive amounts of blood in the stool  Significant tenderness or worsening of abdominal pains  Swelling of  the abdomen that is new, acute  Fever of 100F or higher  For urgent or emergent issues, a gastroenterologist can be reached at any hour by calling [phone]. Do not use MyChart messaging for urgent concerns.   DIET:  We do recommend a small meal at first, but then you may proceed to your regular diet.  Drink plenty of fluids but you should avoid alcoholic beverages for 24 hours.  ACTIVITY:  You should plan to take it easy for the rest of today and you should NOT DRIVE or use heavy machinery until tomorrow (because of the sedation medicines used during the test).    FOLLOW UP: Our staff will call the number listed on your records the next business day following your procedure.  We will call around 7:15- 8:00 am to check on you and address any questions or concerns that you may have regarding the information given to you following your procedure. If we do not reach you, we will leave a message.     SIGNATURES/CONFIDENTIALITY: You and/or your care partner have signed paperwork which will be entered into your electronic medical record.  These signatures attest to the fact that that the information above on your After Visit Summary has been reviewed and is understood.  Full responsibility of the confidentiality of this discharge information lies with you and/or your care-partner.

## 2023-04-19 NOTE — Progress Notes (Signed)
[facility] History and Physical   Primary Care Physician:  Charlane Ferretti, DO   Reason for Procedure:  History of adenomatous colon polyps  Plan:    Surveillance colonoscopy with possible interventions as needed     HPI: Rachel Knight is a very pleasant 73 y.o. female here for surveillance colonoscopy. Denies any nausea, vomiting, abdominal pain, melena or bright red blood per rectum  The risks and benefits as well as alternatives of endoscopic procedure(s) have been discussed and reviewed. All questions answered. The patient agrees to proceed.    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   Vitamin D deficiency     Past Surgical History:  Procedure Laterality Date   APPENDECTOMY  2011   AUGMENTATION MAMMAPLASTY     BREAST ENHANCEMENT SURGERY  1996   COLONOSCOPY  2000 , 2007   Dr Juanda Chance. Due 2017   NECK LIFT  11/2015   WISDOM TOOTH EXTRACTION      Prior to Admission medications   Medication Sig Start Date End Date Taking? Authorizing Provider  Cholecalciferol (VITAMIN D3) 2000 units TABS Take 4,000 Units by mouth daily.    Yes [provider]  citalopram (CELEXA) 10 MG tablet Take by mouth. 07/06/22  Yes [provider]  ipratropium (ATROVENT) 0.06 % nasal spray Place 2 sprays into both nostrils 3 (three) times daily as needed for rhinitis. 04/10/23  Yes Birder Robson, MD  levothyroxine (SYNTHROID) 75 MCG tablet 1 tablet in the morning on an empty stomach Orally Once a day for 90 days   Yes [provider]  Multiple Vitamins-Minerals (OSTEOPRIME ULTRA) TABS as directed.  03/28/17  Yes [provider]  denosumab (PROLIA) 60 MG/ML SOSY injection Inject 60 mg into the skin every 6 (six) months.    [provider]  EPINEPHrine (EPIPEN 2-PAK) 0.3 mg/0.3 mL IJ SOAJ injection Inject 0.3 mg into the muscle as needed for anaphylaxis. 04/10/23   Birder Robson, MD  Estradiol 10 MCG TABS vaginal tablet Place one tablet (10 mcg) in the vagina twice weekly. 07/26/22   Patton Salles, MD  valACYclovir (VALTREX) 500 MG tablet Take 1 tablet po prn for flare ups Orally as directed for 30 days Patient not taking: Reported on 04/19/2023 05/25/21   [provider]    Current Outpatient Medications  Medication Sig Dispense Refill   Cholecalciferol (VITAMIN D3) 2000 units TABS Take 4,000 Units by mouth daily.      citalopram (CELEXA) 10 MG tablet Take by mouth.     ipratropium (ATROVENT) 0.06 % nasal spray Place 2 sprays into both nostrils 3 (three) times daily as needed for rhinitis. 15 mL 5   levothyroxine (SYNTHROID) 75 MCG tablet 1 tablet in the morning on an empty stomach Orally Once a day for 90 days     Multiple Vitamins-Minerals (OSTEOPRIME ULTRA) TABS as directed.      denosumab (PROLIA) 60 MG/ML SOSY injection Inject 60 mg into the skin every 6 (six) months.     EPINEPHrine (EPIPEN 2-PAK) 0.3 mg/0.3 mL IJ SOAJ injection Inject 0.3 mg into the muscle as needed for anaphylaxis. 2 each 1   Estradiol 10 MCG TABS vaginal tablet  Place one tablet (10 mcg) in the vagina twice weekly. 8 tablet 11   valACYclovir (VALTREX) 500 MG tablet Take 1 tablet po prn for flare ups Orally as directed for 30 days (Patient not taking: Reported on 04/19/2023)     Current Facility-Administered Medications  Medication Dose Route Frequency Provider Last Rate Last Admin   0.9 %  sodium chloride infusion  500 mL Intravenous Once Maxen Rowland, Eleonore Chiquito, MD        Allergies as of 04/19/2023 - Review Complete 04/19/2023  Allergen Reaction Noted   Augmentin [amoxicillin-pot clavulanate] Other (See Comments) 12/25/2018   Other Itching 02/05/2017    Family History  Problem Relation Age of Onset   Allergic  rhinitis Mother    Breast cancer Mother 64   Thyroid disease Mother    Dementia Mother    Stroke Father 65   Colon polyps Father    Other Brother 45       OS muscle paralysis , resolution over 3 days. Neg evaluation   Coronary artery disease Maternal Grandmother 63   Coronary artery disease Maternal Grandfather 60   Colon cancer Paternal Grandmother    Colon cancer Paternal Grandfather    Diabetes Neg Hx    Esophageal cancer Neg Hx    Rectal cancer Neg Hx    Stomach cancer Neg Hx     Social History   Socioeconomic History   Marital status: Married    Spouse name: Not on file   Number of children: 2   Years of education: Not on file   Highest education level: Not on file  Occupational History   Occupation: retired  Tobacco Use   Smoking status: Former    Types: Cigarettes   Smokeless tobacco: Never   Tobacco comments:    only smoked age [age] , 1 ppweek  Vaping Use   Vaping status: Never Used  Substance and Sexual Activity   Alcohol use: Yes    Alcohol/week: 2.0 - 4.0 standard drinks of alcohol    Types: 2 - 4 Standard drinks or equivalent per week   Drug use: No   Sexual activity: Yes    Partners: Male    Birth control/protection: Post-menopausal    Comment: first intercourse >18  Other Topics Concern   Not on file  Social History Narrative   Exercise: weights, walking, pilates, swimming   Social Drivers of Health   Financial Resource Strain: Low Risk  (12/11/2018)   Overall Financial Resource Strain (CARDIA)    Difficulty of Paying Living Expenses: Not hard at all  Food Insecurity: No Food Insecurity (12/11/2018)   Hunger Vital Sign    Worried About Running Out of Food in the Last Year: Never true    Ran Out of Food in the Last Year: Never true  Transportation Needs: No Transportation Needs (12/11/2018)   PRAPARE - Administrator, Civil Service (Medical): No    Lack of Transportation (Non-Medical): No  Physical Activity: Sufficiently Active  (12/11/2018)   Exercise Vital Sign    Days of Exercise per Week: 5 days    Minutes of Exercise per Session: 60 min  Stress: No Stress Concern Present (12/11/2018)   [employer] of Occupational Health - Occupational Stress Questionnaire    Feeling of Stress : Not at all  Social Connections: Unknown (12/11/2018)   Social Connection and Isolation Panel [NHANES]    Frequency of Communication with Friends and Family: More than three times a week    Frequency  of Social Gatherings with Friends and Family: More than three times a week    Attends Religious Services: Not on file    Active Member of Clubs or Organizations: Yes    Attends Banker Meetings: More than 4 times per year    Marital Status: Married  Catering manager Violence: Not At Risk (12/11/2018)   Humiliation, Afraid, Rape, and Kick questionnaire    Fear of Current or Ex-Partner: No    Emotionally Abused: No    Physically Abused: No    Sexually Abused: No    Review of Systems:  All other review of systems negative except as mentioned in the HPI.  Physical Exam: Vital signs in last 24 hours: BP (!) 108/54   Pulse 64   Temp (!) 97.1 F (36.2 C) (Skin)   Ht 5\' 6"  (1.676 m)   Wt 120 lb (54.4 kg)   LMP 06/05/2006 (Approximate)   SpO2 100%   BMI 19.37 kg/m  General:   Alert, NAD Lungs:  Clear .   Heart:  Regular rate and rhythm Abdomen:  Soft, nontender and nondistended. Neuro/Psych:  Alert and cooperative. Normal mood and affect. A and O x 3  Reviewed labs, radiology imaging, old records and pertinent past GI work up  Patient is appropriate for planned procedure(s) and anesthesia in an ambulatory setting   K. Scherry Ran , MD [phone]

## 2023-04-20 ENCOUNTER — Telehealth: Payer: Self-pay

## 2023-04-20 NOTE — Telephone Encounter (Signed)
Left message on follow up call.

## 2023-04-21 DIAGNOSIS — Z23 Encounter for immunization: Secondary | ICD-10-CM | POA: Diagnosis not present

## 2023-05-03 ENCOUNTER — Ambulatory Visit (INDEPENDENT_AMBULATORY_CARE_PROVIDER_SITE_OTHER): Payer: Self-pay

## 2023-05-03 DIAGNOSIS — J309 Allergic rhinitis, unspecified: Secondary | ICD-10-CM | POA: Diagnosis not present

## 2023-05-11 ENCOUNTER — Ambulatory Visit (INDEPENDENT_AMBULATORY_CARE_PROVIDER_SITE_OTHER): Payer: Self-pay | Admitting: *Deleted

## 2023-05-11 DIAGNOSIS — J309 Allergic rhinitis, unspecified: Secondary | ICD-10-CM

## 2023-05-22 ENCOUNTER — Ambulatory Visit (INDEPENDENT_AMBULATORY_CARE_PROVIDER_SITE_OTHER): Payer: Self-pay | Admitting: *Deleted

## 2023-05-22 DIAGNOSIS — J309 Allergic rhinitis, unspecified: Secondary | ICD-10-CM | POA: Diagnosis not present

## 2023-05-31 ENCOUNTER — Ambulatory Visit (INDEPENDENT_AMBULATORY_CARE_PROVIDER_SITE_OTHER): Payer: Self-pay

## 2023-05-31 DIAGNOSIS — J309 Allergic rhinitis, unspecified: Secondary | ICD-10-CM | POA: Diagnosis not present

## 2023-06-07 ENCOUNTER — Ambulatory Visit (INDEPENDENT_AMBULATORY_CARE_PROVIDER_SITE_OTHER): Payer: Self-pay

## 2023-06-07 DIAGNOSIS — J309 Allergic rhinitis, unspecified: Secondary | ICD-10-CM | POA: Diagnosis not present

## 2023-06-12 DIAGNOSIS — E039 Hypothyroidism, unspecified: Secondary | ICD-10-CM | POA: Diagnosis not present

## 2023-06-12 DIAGNOSIS — M810 Age-related osteoporosis without current pathological fracture: Secondary | ICD-10-CM | POA: Diagnosis not present

## 2023-06-12 DIAGNOSIS — E781 Pure hyperglyceridemia: Secondary | ICD-10-CM | POA: Diagnosis not present

## 2023-06-15 ENCOUNTER — Ambulatory Visit (INDEPENDENT_AMBULATORY_CARE_PROVIDER_SITE_OTHER)

## 2023-06-15 DIAGNOSIS — J309 Allergic rhinitis, unspecified: Secondary | ICD-10-CM

## 2023-06-19 DIAGNOSIS — E781 Pure hyperglyceridemia: Secondary | ICD-10-CM | POA: Diagnosis not present

## 2023-06-19 DIAGNOSIS — R946 Abnormal results of thyroid function studies: Secondary | ICD-10-CM | POA: Diagnosis not present

## 2023-06-19 DIAGNOSIS — M810 Age-related osteoporosis without current pathological fracture: Secondary | ICD-10-CM | POA: Diagnosis not present

## 2023-06-19 DIAGNOSIS — Z803 Family history of malignant neoplasm of breast: Secondary | ICD-10-CM | POA: Diagnosis not present

## 2023-06-19 DIAGNOSIS — Z1331 Encounter for screening for depression: Secondary | ICD-10-CM | POA: Diagnosis not present

## 2023-06-19 DIAGNOSIS — E785 Hyperlipidemia, unspecified: Secondary | ICD-10-CM | POA: Diagnosis not present

## 2023-06-19 DIAGNOSIS — F419 Anxiety disorder, unspecified: Secondary | ICD-10-CM | POA: Diagnosis not present

## 2023-06-19 DIAGNOSIS — R7989 Other specified abnormal findings of blood chemistry: Secondary | ICD-10-CM | POA: Diagnosis not present

## 2023-06-19 DIAGNOSIS — Z0000 Encounter for general adult medical examination without abnormal findings: Secondary | ICD-10-CM | POA: Diagnosis not present

## 2023-06-19 DIAGNOSIS — R053 Chronic cough: Secondary | ICD-10-CM | POA: Diagnosis not present

## 2023-06-19 DIAGNOSIS — B009 Herpesviral infection, unspecified: Secondary | ICD-10-CM | POA: Diagnosis not present

## 2023-06-19 DIAGNOSIS — Z1339 Encounter for screening examination for other mental health and behavioral disorders: Secondary | ICD-10-CM | POA: Diagnosis not present

## 2023-06-19 DIAGNOSIS — E039 Hypothyroidism, unspecified: Secondary | ICD-10-CM | POA: Diagnosis not present

## 2023-06-26 ENCOUNTER — Ambulatory Visit (INDEPENDENT_AMBULATORY_CARE_PROVIDER_SITE_OTHER)

## 2023-06-26 DIAGNOSIS — J309 Allergic rhinitis, unspecified: Secondary | ICD-10-CM

## 2023-07-02 ENCOUNTER — Ambulatory Visit (INDEPENDENT_AMBULATORY_CARE_PROVIDER_SITE_OTHER): Payer: Self-pay

## 2023-07-02 DIAGNOSIS — J309 Allergic rhinitis, unspecified: Secondary | ICD-10-CM

## 2023-07-09 ENCOUNTER — Ambulatory Visit (INDEPENDENT_AMBULATORY_CARE_PROVIDER_SITE_OTHER): Payer: Self-pay

## 2023-07-09 DIAGNOSIS — J309 Allergic rhinitis, unspecified: Secondary | ICD-10-CM | POA: Diagnosis not present

## 2023-07-23 ENCOUNTER — Ambulatory Visit (INDEPENDENT_AMBULATORY_CARE_PROVIDER_SITE_OTHER): Payer: Self-pay

## 2023-07-23 DIAGNOSIS — J309 Allergic rhinitis, unspecified: Secondary | ICD-10-CM | POA: Diagnosis not present

## 2023-07-25 ENCOUNTER — Other Ambulatory Visit (HOSPITAL_COMMUNITY): Payer: Self-pay | Admitting: *Deleted

## 2023-07-25 DIAGNOSIS — Z1231 Encounter for screening mammogram for malignant neoplasm of breast: Secondary | ICD-10-CM | POA: Diagnosis not present

## 2023-07-26 ENCOUNTER — Ambulatory Visit: Payer: Self-pay | Admitting: Obstetrics and Gynecology

## 2023-07-26 ENCOUNTER — Encounter: Payer: Self-pay | Admitting: Obstetrics and Gynecology

## 2023-07-26 ENCOUNTER — Ambulatory Visit (HOSPITAL_COMMUNITY)
Admission: RE | Admit: 2023-07-26 | Discharge: 2023-07-26 | Disposition: A | Discharge status: Home / Self Care | Source: Ambulatory Visit | Attending: Internal Medicine | Admitting: Internal Medicine

## 2023-07-26 DIAGNOSIS — M810 Age-related osteoporosis without current pathological fracture: Secondary | ICD-10-CM | POA: Insufficient documentation

## 2023-07-26 MED ORDER — DENOSUMAB 60 MG/ML ~~LOC~~ SOSY
PREFILLED_SYRINGE | SUBCUTANEOUS | Status: AC
Start: 2023-07-26 — End: ?
  Filled 2023-07-26: qty 1

## 2023-07-26 MED ORDER — DENOSUMAB 60 MG/ML ~~LOC~~ SOSY
60.0000 mg | PREFILLED_SYRINGE | Freq: Once | SUBCUTANEOUS | Status: AC
Start: 2023-07-26 — End: 2023-07-26
  Administered 2023-07-26: 60 mg via SUBCUTANEOUS

## 2023-08-08 ENCOUNTER — Ambulatory Visit (INDEPENDENT_AMBULATORY_CARE_PROVIDER_SITE_OTHER): Payer: Self-pay

## 2023-08-08 DIAGNOSIS — J309 Allergic rhinitis, unspecified: Secondary | ICD-10-CM

## 2023-08-15 ENCOUNTER — Ambulatory Visit (INDEPENDENT_AMBULATORY_CARE_PROVIDER_SITE_OTHER): Payer: Self-pay

## 2023-08-15 DIAGNOSIS — J309 Allergic rhinitis, unspecified: Secondary | ICD-10-CM | POA: Diagnosis not present

## 2023-08-27 ENCOUNTER — Ambulatory Visit (INDEPENDENT_AMBULATORY_CARE_PROVIDER_SITE_OTHER): Payer: Self-pay

## 2023-08-27 DIAGNOSIS — J309 Allergic rhinitis, unspecified: Secondary | ICD-10-CM

## 2023-09-10 DIAGNOSIS — S0083XA Contusion of other part of head, initial encounter: Secondary | ICD-10-CM | POA: Diagnosis not present

## 2023-09-10 DIAGNOSIS — S0990XA Unspecified injury of head, initial encounter: Secondary | ICD-10-CM | POA: Diagnosis not present

## 2023-09-10 DIAGNOSIS — E079 Disorder of thyroid, unspecified: Secondary | ICD-10-CM | POA: Diagnosis not present

## 2023-09-10 DIAGNOSIS — S8991XA Unspecified injury of right lower leg, initial encounter: Secondary | ICD-10-CM | POA: Diagnosis not present

## 2023-09-10 DIAGNOSIS — S8001XA Contusion of right knee, initial encounter: Secondary | ICD-10-CM | POA: Diagnosis not present

## 2023-09-10 DIAGNOSIS — S199XXA Unspecified injury of neck, initial encounter: Secondary | ICD-10-CM | POA: Diagnosis not present

## 2023-09-12 DIAGNOSIS — G44319 Acute post-traumatic headache, not intractable: Secondary | ICD-10-CM | POA: Diagnosis not present

## 2023-09-12 DIAGNOSIS — S0012XA Contusion of left eyelid and periocular area, initial encounter: Secondary | ICD-10-CM | POA: Diagnosis not present

## 2023-09-12 DIAGNOSIS — S01112A Laceration without foreign body of left eyelid and periocular area, initial encounter: Secondary | ICD-10-CM | POA: Diagnosis not present

## 2023-09-24 ENCOUNTER — Ambulatory Visit (INDEPENDENT_AMBULATORY_CARE_PROVIDER_SITE_OTHER)

## 2023-09-24 DIAGNOSIS — J309 Allergic rhinitis, unspecified: Secondary | ICD-10-CM | POA: Diagnosis not present

## 2023-10-05 ENCOUNTER — Ambulatory Visit (INDEPENDENT_AMBULATORY_CARE_PROVIDER_SITE_OTHER)

## 2023-10-05 DIAGNOSIS — J309 Allergic rhinitis, unspecified: Secondary | ICD-10-CM

## 2023-10-09 ENCOUNTER — Other Ambulatory Visit: Payer: Self-pay

## 2023-10-09 ENCOUNTER — Ambulatory Visit (INDEPENDENT_AMBULATORY_CARE_PROVIDER_SITE_OTHER): Payer: Medicare Other | Admitting: Allergy and Immunology

## 2023-10-09 ENCOUNTER — Encounter: Payer: Self-pay | Admitting: Allergy and Immunology

## 2023-10-09 VITALS — BP 108/80 | HR 64 | Temp 97.3°F | Resp 18 | Ht 66.0 in | Wt 125.0 lb

## 2023-10-09 DIAGNOSIS — K219 Gastro-esophageal reflux disease without esophagitis: Secondary | ICD-10-CM

## 2023-10-09 DIAGNOSIS — J3089 Other allergic rhinitis: Secondary | ICD-10-CM | POA: Diagnosis not present

## 2023-10-09 MED ORDER — IPRATROPIUM BROMIDE 0.06 % NA SOLN: 2.0000 | Freq: Three times a day (TID) | NASAL | 3 refills | Status: AC | PRN

## 2023-10-09 NOTE — Patient Instructions (Addendum)
  1.  Allergen avoidance measures - dust mite  2.  Treat and prevent reflux/LPR:   A. Replace throat clearing with swallowing/drinking maneuver.  B. Limit foods that cause the  globus/phlegm sensation that comes  C. Minimize caffeine /chocolate / alcohol      3.  If needed:   A. Sinus saline  B. Ipratropium 0.06% -2 sprays each nostril 2-3 times a day   4. Continue immunotherapy  5. Influenza = Tamiflu . Covid = Paxlovid  6.  Return to clinic in 1 year or earlier if problem

## 2023-10-09 NOTE — Progress Notes (Unsigned)
 [place] [facility] [place] [facility] [place] - [place] - [employer]   Follow-up Note  Referring Provider: Valentin Skates, DO Primary Provider: Valentin Skates, DO Date of Office Visit: 10/09/2023  Subjective:   Rachel Knight (DOB: 1950/09/13) is a 73 y.o. female who [facility] in re-evaluation of the following:  HPI: Tinnie returns to this clinic in evaluation of allergic rhinitis and LPR.  She was last seen by Dr. Tobie on 10 April 2023.  I last saw her in this clinic 14 February 2022.  She is doing very well at this point in time regarding both her nasal issues and her throat issues while continuing on immunotherapy currently every 4 weeks without any adverse effect and using nasal ipratropium as well as being very cognizant of throat clearing and limiting foods that cause her to produce mucus and minimizing caffeine and chocolate and alcohol  consumption.  She no longer uses a proton pump inhibitor as that agent had actually made her issues worse.  Allergies as of 10/09/2023       Reactions   Augmentin  [amoxicillin -pot Clavulanate] Other (See Comments)   GERD, upset stomach   Other Itching   Food sensitivities        Medication List    citalopram  10 MG tablet Commonly known as: CELEXA  Take by mouth.   denosumab  60 MG/ML Sosy injection Commonly known as: PROLIA  Inject 60 mg into the skin every 6 (six) months.   EPINEPHrine  0.3 mg/0.3 mL Soaj injection Commonly known as: EpiPen  2-Pak Inject 0.3 mg into the muscle as needed for anaphylaxis.   Estradiol  10 MCG Tabs vaginal tablet Place one tablet (10 mcg) in the vagina twice weekly.   famotidine  20 MG tablet Commonly known as: PEPCID  Take 1 tablet (20 mg total) by mouth 2 (two) times daily as needed for heartburn or indigestion.   ipratropium 0.06 % nasal spray Commonly known as: ATROVENT  Place 2 sprays into both nostrils 3 (three) times daily as needed for rhinitis.    levothyroxine  75 MCG tablet Commonly known as: SYNTHROID  1 tablet in the morning on an empty stomach Orally Once a day for 90 days   OsteoPrime Ultra Tabs as directed.   valACYclovir  500 MG tablet Commonly known as: VALTREX  Take 1 tablet po prn for flare ups Orally as directed for 30 days   Vitamin D3 50 MCG (2000 UT) Tabs Take 4,000 Units by mouth daily.    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   Vitamin D  deficiency     Past Surgical History:  Procedure Laterality Date   APPENDECTOMY  2011   AUGMENTATION MAMMAPLASTY     BREAST ENHANCEMENT SURGERY  1996   COLONOSCOPY  2000 , 2007   Dr Obie. Due 2017   NECK LIFT  11/2015   WISDOM TOOTH EXTRACTION      Review of systems negative except as noted in HPI / PMHx or noted below:  Review of Systems  Constitutional: Negative.   HENT: Negative.    Eyes: Negative.   Respiratory: Negative.    Cardiovascular: Negative.   Gastrointestinal: Negative.   Genitourinary: Negative.   Musculoskeletal: Negative.   Skin: Negative.  Neurological: Negative.   Endo/Heme/Allergies: Negative.   Psychiatric/Behavioral: Negative.       Objective:   Vitals:   10/09/23 0931  BP: 108/80  Pulse: 64  Resp: 18  Temp: (!) 97.3 F (36.3 C)  SpO2: 98%   Height: 5' 6 (167.6 cm)  Weight: 125 lb (56.7 kg)   Physical Exam Constitutional:      Appearance: She is not diaphoretic.  HENT:     Head: Normocephalic.     Right Ear: Tympanic membrane, ear canal and external ear normal.     Left Ear: Tympanic membrane, ear canal and external ear normal.     Nose: Nose normal. No mucosal edema or rhinorrhea.     Mouth/Throat:     Pharynx: Uvula midline. No oropharyngeal exudate.  Eyes:     Conjunctiva/sclera:  Conjunctivae normal.  Neck:     Thyroid : No thyromegaly.     Trachea: Trachea normal. No tracheal tenderness or tracheal deviation.  Cardiovascular:     Rate and Rhythm: Normal rate and regular rhythm.     Heart sounds: Normal heart sounds, S1 normal and S2 normal. No murmur heard. Pulmonary:     Effort: No respiratory distress.     Breath sounds: Normal breath sounds. No stridor. No wheezing or rales.  Lymphadenopathy:     Head:     Right side of head: No tonsillar adenopathy.     Left side of head: No tonsillar adenopathy.     Cervical: No cervical adenopathy.  Skin:    Findings: No erythema or rash.     Nails: There is no clubbing.  Neurological:     Mental Status: She is alert.     Diagnostics: none  Assessment and Plan:   1. Perennial allergic rhinitis   2. LPRD (laryngopharyngeal reflux disease)    1.  Allergen avoidance measures - dust mite  2.  Treat and prevent reflux/LPR:   A. Replace throat clearing with swallowing/drinking maneuver.  B. Limit foods that cause the  globus/phlegm sensation that comes  C. Minimize caffeine /chocolate / alcohol      3.  If needed:   A. Sinus saline  B. Ipratropium 0.06% -2 sprays each nostril 2-3 times a day   4. Continue immunotherapy  5. Influenza = Tamiflu . Covid = Paxlovid  6.  Return to clinic in 1 year or earlier if problem  Tinnie is doing very well with her allergic rhinitis and LPR and her current plan which includes minimal amounts of medications and consistent use of immunotherapy.  She will continue on this plan and we will see her back in this clinic in 1 year or earlier if there is a problem.  Camellia Denis, MD Allergy  / Immunology [facility]

## 2023-10-10 ENCOUNTER — Encounter: Payer: Self-pay | Admitting: Allergy and Immunology

## 2023-10-12 ENCOUNTER — Ambulatory Visit (INDEPENDENT_AMBULATORY_CARE_PROVIDER_SITE_OTHER)

## 2023-10-12 DIAGNOSIS — J309 Allergic rhinitis, unspecified: Secondary | ICD-10-CM

## 2023-10-12 DIAGNOSIS — R945 Abnormal results of liver function studies: Secondary | ICD-10-CM | POA: Diagnosis not present

## 2023-10-22 ENCOUNTER — Ambulatory Visit (INDEPENDENT_AMBULATORY_CARE_PROVIDER_SITE_OTHER)

## 2023-10-22 DIAGNOSIS — J309 Allergic rhinitis, unspecified: Secondary | ICD-10-CM | POA: Diagnosis not present

## 2023-11-01 ENCOUNTER — Ambulatory Visit (INDEPENDENT_AMBULATORY_CARE_PROVIDER_SITE_OTHER)

## 2023-11-01 DIAGNOSIS — J309 Allergic rhinitis, unspecified: Secondary | ICD-10-CM | POA: Diagnosis not present

## 2023-11-15 ENCOUNTER — Ambulatory Visit (INDEPENDENT_AMBULATORY_CARE_PROVIDER_SITE_OTHER)

## 2023-11-15 DIAGNOSIS — J309 Allergic rhinitis, unspecified: Secondary | ICD-10-CM

## 2023-11-23 ENCOUNTER — Ambulatory Visit (INDEPENDENT_AMBULATORY_CARE_PROVIDER_SITE_OTHER): Admitting: *Deleted

## 2023-11-23 DIAGNOSIS — H2513 Age-related nuclear cataract, bilateral: Secondary | ICD-10-CM | POA: Diagnosis not present

## 2023-11-23 DIAGNOSIS — J309 Allergic rhinitis, unspecified: Secondary | ICD-10-CM

## 2023-11-29 ENCOUNTER — Ambulatory Visit (INDEPENDENT_AMBULATORY_CARE_PROVIDER_SITE_OTHER)

## 2023-11-29 DIAGNOSIS — J309 Allergic rhinitis, unspecified: Secondary | ICD-10-CM

## 2023-12-06 NOTE — Progress Notes (Signed)
 73 y.o. G63P2002 Married Caucasian female here for a breast and pelvic exam.    Patient is followed for FH breast cancer, and vaginal atrophy.    She is using Vagifem  and does not always remember.   Difficulty to get to the bathroom on time.  No routine loss of urine with sneeze.  Very little caffeine use.   Not up at night to void unless drinking a lot of water.    Uses nonformulary version of Minoxidil from her PCP.    PCP: Valentin Skates, DO   Patient's last menstrual period was 06/05/2006 (approximate).           Sexually active: Yes.    The current method of family planning is post menopausal status.    Menopausal hormone therapy:  estradiol   Exercising: Yes.    Walking, golf, weight lifting, pilates  Smoker:  Former   OB History     Gravida  2   Para  2   Term  2   Preterm      AB      Living  2      SAB      IAB      Ectopic      Multiple      Live Births              HEALTH MAINTENANCE: Last 2 paps: 07/26/22 neg HR HPV neg, 07/07/20 neg History of abnormal Pap or positive HPV:  no Mammogram:  07/25/23 Breast Density Cat A, BIRADS Cat 2 benign  Colonoscopy: 04/19/23  Bone Density:  06/11/19  Result  low bone mass.  Doing Dexa with PCP tomorrow at their office.  On Prolia .     Immunization History  Administered Date(s) Administered  . DTaP 10/29/2008  . Fluad Quad(high Dose 65+) 01/20/2019  . HPV Quadrivalent 03/06/1998  . Hepatitis A, Adult 09/29/2020  . INFLUENZA, HIGH DOSE SEASONAL PF 12/05/2017  . Influenza Split 01/26/2014  . Influenza,inj,Quad PF,6+ Mos 03/12/2017  . Influenza-Unspecified 01/18/2016, 02/03/2017  . PFIZER(Purple Top)SARS-COV-2 Vaccination 04/02/2019, 04/23/2019, 12/01/2019  . Pneumococcal Conjugate-13 12/12/2017  . Pneumococcal Polysaccharide-23 12/25/2018  . Tdap 03/06/2008, 04/30/2018  . Zoster Recombinant(Shingrix ) 07/05/2017, 01/14/2018  . Zoster, Live 10/09/2013      reports that she has quit smoking. Her  smoking use included cigarettes. She has never used smokeless tobacco. She reports current alcohol  use of about 2.0 - 4.0 standard drinks of alcohol  per week. She reports that she does not use drugs.  Past Medical History:  Diagnosis Date  . Anxiety   . Depression   . GERD (gastroesophageal reflux disease)    occasional   . HSV-1 infection   . Hypothyroidism   . Osteopenia   . Perennial allergic rhinitis   . 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  . Vitamin D  deficiency     Past Surgical History:  Procedure Laterality Date  . APPENDECTOMY  2011  . AUGMENTATION MAMMAPLASTY    . BREAST ENHANCEMENT SURGERY  1996  . COLONOSCOPY  2000 , 2007   Dr Obie. Due 2017  . NECK LIFT  11/2015  . WISDOM TOOTH EXTRACTION      Current Outpatient Medications  Medication Sig Dispense Refill  . Cholecalciferol (VITAMIN D3) 2000 units TABS Take 4,000 Units by mouth daily.     . citalopram  (CELEXA ) 10 MG tablet Take by mouth.    . denosumab  (PROLIA ) 60 MG/ML SOSY injection Inject 60 mg into the skin  every 6 (six) months.    . EPINEPHrine  (EPIPEN  2-PAK) 0.3 mg/0.3 mL IJ SOAJ injection Inject 0.3 mg into the muscle as needed for anaphylaxis. 2 each 1  . Estradiol  10 MCG TABS vaginal tablet Place one tablet (10 mcg) in the vagina twice weekly. 8 tablet 11  . ipratropium (ATROVENT ) 0.06 % nasal spray Place 2 sprays into both nostrils 3 (three) times daily as needed for rhinitis. 45 mL 3  . levothyroxine  (SYNTHROID ) 75 MCG tablet 1 tablet in the morning on an empty stomach Orally Once a day for 90 days    . Multiple Vitamins-Minerals (OSTEOPRIME ULTRA) TABS as directed.     . valACYclovir  (VALTREX ) 500 MG tablet Take 1 tablet po prn for flare ups Orally as directed for 30 days     No current facility-administered medications for this visit.    ALLERGIES: Ibandronate, Augmentin  [amoxicillin -pot clavulanate], and Other  Family History  Problem Relation Age of Onset  . Allergic  rhinitis Mother   . Breast cancer Mother 56  . Thyroid  disease Mother   . Dementia Mother   . Stroke Father 84  . Colon polyps Father   . Other Brother 45       OS muscle paralysis , resolution over 3 days. Neg evaluation  . Coronary artery disease Maternal Grandmother 10  . Coronary artery disease Maternal Grandfather 60  . Colon cancer Paternal Grandmother   . Colon cancer Paternal Grandfather   . Diabetes Neg Hx   . Esophageal cancer Neg Hx   . Rectal cancer Neg Hx   . Stomach cancer Neg Hx     Review of Systems  All other systems reviewed and are negative.   PHYSICAL EXAM:  BP 106/68 (BP Location: Left Arm, Patient Position: Sitting)   Pulse 87   Ht 5' 6.5 (1.689 m)   Wt 124 lb (56.2 kg)   LMP 06/05/2006 (Approximate)   SpO2 99%   BMI 19.71 kg/m     General appearance: alert, cooperative and appears stated age Head: normocephalic, without obvious abnormality, atraumatic Neck: no adenopathy, supple, symmetrical, trachea midline and thyroid  normal to inspection and palpation Lungs: clear to auscultation bilaterally Breasts: bilateral implants present, normal appearance, no masses or tenderness, No nipple retraction or dimpling, No nipple discharge or bleeding, No axillary adenopathy Heart: regular rate and rhythm Abdomen: soft, non-tender; no masses, no organomegaly Extremities: extremities normal, atraumatic, no cyanosis or edema Skin: skin color, texture, turgor normal. No rashes or lesions Lymph nodes: cervical, supraclavicular, and axillary nodes normal. Neurologic: grossly normal  Pelvic: External genitalia:  no lesions              No abnormal inguinal nodes palpated.              Urethra:  normal appearing urethra with no masses, tenderness or lesions              Bartholins and Skenes: normal                 Vagina: normal appearing vagina with normal color and discharge, no lesions              Cervix: no lesions              Pap taken: no Bimanual Exam:   Uterus:  normal size, contour, position, consistency, mobility, non-tender.  Strong Kegel.               Adnexa: no mass, fullness, tenderness  Rectal exam: yes.  Confirms.              Anus:  normal sphincter tone, no lesions  Chaperone was present for exam:  Kari HERO, CMA  ASSESSMENT: Encounter for breast and pelvic exam.  Other specified risk factors not otherwise specified.  Bilateral breast augmentation.  FH of breast cancer in mother in her 55s.  Atrophy of vagina. Urinary urgency.   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  Valtrex  prn through PCP. Borderline osteoporosis.   On Prolia  through PCP. Encounter for medication monitoring.  Some urinary urgency.    PLAN: Mammogram screening discussed. Self breast awareness reviewed. Pap and HRV collected:  no.  Due in 2029. Guidelines for Calcium, Vitamin D , regular exercise program including cardiovascular and weight bearing exercise. Medication refills:  estradiol  vaginal tablets 10 mcg pv twice weekly.  #24, RF 3.  I discussed potential effect on breast cancer. Kegel's reviewed.  She currently declines referral to pelvic floor therapy.   Labs and Dexa with PCP.  Follow up:  yearly and prn.     Additional counseling given.  yes. 20 min  total time was spent for this patient encounter, including preparation, face-to-face counseling with the patient, coordination of care, and documentation of the encounter in addition to doing the breast and pelvic exam.

## 2023-12-07 ENCOUNTER — Ambulatory Visit (INDEPENDENT_AMBULATORY_CARE_PROVIDER_SITE_OTHER)

## 2023-12-07 DIAGNOSIS — J309 Allergic rhinitis, unspecified: Secondary | ICD-10-CM

## 2023-12-10 ENCOUNTER — Encounter: Payer: Self-pay | Admitting: Obstetrics and Gynecology

## 2023-12-10 ENCOUNTER — Ambulatory Visit (INDEPENDENT_AMBULATORY_CARE_PROVIDER_SITE_OTHER): Admitting: Obstetrics and Gynecology

## 2023-12-10 VITALS — BP 106/68 | HR 87 | Ht 66.5 in | Wt 124.0 lb

## 2023-12-10 DIAGNOSIS — Z5181 Encounter for therapeutic drug level monitoring: Secondary | ICD-10-CM | POA: Diagnosis not present

## 2023-12-10 DIAGNOSIS — N952 Postmenopausal atrophic vaginitis: Secondary | ICD-10-CM

## 2023-12-10 DIAGNOSIS — Z9189 Other specified personal risk factors, not elsewhere classified: Secondary | ICD-10-CM

## 2023-12-10 DIAGNOSIS — B009 Herpesviral infection, unspecified: Secondary | ICD-10-CM | POA: Diagnosis not present

## 2023-12-10 DIAGNOSIS — R3915 Urgency of urination: Secondary | ICD-10-CM | POA: Diagnosis not present

## 2023-12-10 DIAGNOSIS — Z01419 Encounter for gynecological examination (general) (routine) without abnormal findings: Secondary | ICD-10-CM

## 2023-12-10 MED ORDER — ESTRADIOL 10 MCG VA TABS: ORAL_TABLET | VAGINAL | 3 refills | Status: AC

## 2023-12-10 NOTE — Patient Instructions (Addendum)
 Kegel Exercises There are many reasons your provider may suggest Kegel exercises. This video will teach you how to do Kegel exercises. To view the content, go to this web address: [email or web address]  This video will expire on: 02/14/2025. If you need access to this video following this date, please reach out to the healthcare provider who assigned it to you. This information is not intended to replace advice given to you by your health care provider. Make sure you discuss any questions you have with your health care provider. Elsevier Patient Education  2024 [employer].  EXERCISE AND DIET:  We recommended that you start or continue a regular exercise program for good health. Regular exercise means any activity that makes your heart beat faster and makes you sweat.  We recommend exercising at least 30 minutes per day at least 3 days a week, preferably 4 or 5.  We also recommend a diet low in fat and sugar.  Inactivity, poor dietary choices and obesity can cause diabetes, heart attack, stroke, and kidney damage, among others.    ALCOHOL AND SMOKING:  Women should limit their alcohol intake to no more than 7 drinks/beers/glasses of wine (combined, not each!) per week. Moderation of alcohol intake to this level decreases your risk of breast cancer and liver damage. And of course, no recreational drugs are part of a healthy lifestyle.  And absolutely no smoking or even second hand smoke. Most people know smoking can cause heart and lung diseases, but did you know it also contributes to weakening of your bones? Aging of your skin?  Yellowing of your teeth and nails?  CALCIUM AND VITAMIN D:  Adequate intake of calcium and Vitamin D are recommended.  The recommendations for exact amounts of these supplements seem to change often, but generally speaking 600 mg of calcium (either carbonate or citrate) and 800 units of Vitamin D per day seems prudent. Certain women may benefit from higher intake  of Vitamin D.  If you are among these women, your doctor will have told you during your visit.    PAP SMEARS:  Pap smears, to check for cervical cancer or precancers,  have traditionally been done yearly, although recent scientific advances have shown that most women can have pap smears less often.  However, every woman still should have a physical exam from her gynecologist every year. It will include a breast check, inspection of the vulva and vagina to check for abnormal growths or skin changes, a visual exam of the cervix, and then an exam to evaluate the size and shape of the uterus and ovaries.  And after 71 years of age, a rectal exam is indicated to check for rectal cancers. We will also provide age appropriate advice regarding health maintenance, like when you should have certain vaccines, screening for sexually transmitted diseases, bone density testing, colonoscopy, mammograms, etc.   MAMMOGRAMS:  All women over 71 years old should have a yearly mammogram. Many facilities now offer a "3D" mammogram, which may cost around $50 extra out of pocket. If possible,  we recommend you accept the option to have the 3D mammogram performed.  It both reduces the number of women who will be called back for extra views which then turn out to be normal, and it is better than the routine mammogram at detecting truly abnormal areas.    COLONOSCOPY:  Colonoscopy to screen for colon cancer is recommended for all women at age 71.  We know, you hate the idea of the  prep.  We agree, BUT, having colon cancer and not knowing it is worse!!  Colon cancer so often starts as a polyp that can be seen and removed at colonscopy, which can quite literally save your life!  And if your first colonoscopy is normal and you have no family history of colon cancer, most women don't have to have it again for 10 years.  Once every ten years, you can do something that may end up saving your life, right?  We will be happy to help you get it  scheduled when you are ready.  Be sure to check your insurance coverage so you understand how much it will cost.  It may be covered as a preventative service at no cost, but you should check your particular policy.

## 2023-12-11 DIAGNOSIS — M810 Age-related osteoporosis without current pathological fracture: Secondary | ICD-10-CM | POA: Diagnosis not present

## 2023-12-17 ENCOUNTER — Ambulatory Visit (INDEPENDENT_AMBULATORY_CARE_PROVIDER_SITE_OTHER)

## 2023-12-17 DIAGNOSIS — J309 Allergic rhinitis, unspecified: Secondary | ICD-10-CM | POA: Diagnosis not present

## 2023-12-18 ENCOUNTER — Encounter: Payer: Self-pay | Admitting: Gastroenterology

## 2023-12-18 ENCOUNTER — Ambulatory Visit (INDEPENDENT_AMBULATORY_CARE_PROVIDER_SITE_OTHER): Admitting: Gastroenterology

## 2023-12-18 VITALS — BP 102/62 | HR 50 | Ht 66.0 in | Wt 125.0 lb

## 2023-12-18 DIAGNOSIS — K219 Gastro-esophageal reflux disease without esophagitis: Secondary | ICD-10-CM

## 2023-12-18 MED ORDER — FAMOTIDINE 20 MG PO TABS: 20.0000 mg | ORAL_TABLET | Freq: Two times a day (BID) | ORAL | 3 refills | Status: AC

## 2023-12-18 NOTE — Patient Instructions (Addendum)
 We have sent the following medications to your pharmacy for you to pick up at your convenience: Famotidine  20 mg twice daily at breakfast and dinner.   You have been scheduled for an endoscopy. Please follow written instructions given to you at your visit today.  If you use inhalers (even only as needed), please bring them with you on the day of your procedure.  If you take any of the following medications, they will need to be adjusted prior to your procedure:   DO NOT TAKE 7 DAYS PRIOR TO TEST- Trulicity (dulaglutide) Ozempic, Wegovy (semaglutide) Mounjaro (tirzepatide) Bydureon Bcise (exanatide extended release)  DO NOT TAKE 1 DAY PRIOR TO YOUR TEST Rybelsus (semaglutide) Adlyxin (lixisenatide) Victoza (liraglutide) Byetta (exanatide) ___________________________________________________________________________

## 2023-12-18 NOTE — Progress Notes (Addendum)
 12/18/2023 PETRITA BLUNCK [id number] 1950-09-14   Discussed the use of AI scribe software for clinical note transcription with the patient, who gave verbal consent to proceed.  History of Present Illness Pam Vanalstine is a 73 year old female with a history of acid reflux and allergies who presents with worsening symptoms of acid reflux and heartburn.  She is a patient of Dr. Trenna.  She has been experiencing worsening symptoms of acid reflux and heartburn. Previously, she discontinued omeprazole  and managed her symptoms with famotidine  as of May 2024. Recently, her symptoms have worsened, including heartburn, acid reflux, mucus production, gas, and pain when eating. These symptoms do not disturb her sleep.  She identifies several food triggers that exacerbate her symptoms, including nuts, tomatoes, chocolate, caffeine, alcohol , fried foods, and wheat. She has been consuming raw vegetables with hummus as a snack due to these dietary restrictions. Despite these efforts, she feels like she 'can't eat anything anymore.'  She reports a recent cold about a month ago, which was not accompanied by fever and was not COVID-19. She suspects some residual post-nasal drip or drainage from this cold, which may be contributing to her symptoms. She took Zyrtec last night for seasonal allergies and is currently receiving allergy  shots for dust mite allergies.  She has tried over-the-counter medications like famotidine , Pepto Bismol, and omeprazole , but feels that these sometimes worsen her symptoms, particularly the mucus production. She has not been using any medication for her reflux recently and is seeking guidance on how to manage her symptoms effectively.  She has a history of seeing an allergist and has been diagnosed with dust mite allergies. She has also seen an ENT in the past for ear-related issues, which were attributed to allergies. She has not had an endoscopy yet, although it  was previously suggested during her last colonoscopy.  She did not proceed previously because symptoms improved for some time.  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   Vitamin D  deficiency    Past Surgical History:  Procedure Laterality Date   APPENDECTOMY  2011   AUGMENTATION MAMMAPLASTY     BREAST ENHANCEMENT SURGERY  1996   COLONOSCOPY  2000 , 2007   Dr Obie. Due 2017   NECK LIFT  11/2015   WISDOM TOOTH EXTRACTION      reports that she has quit smoking. Her smoking use included cigarettes. She has never used smokeless tobacco. She reports current alcohol  use of about 2.0 - 4.0 standard drinks of alcohol  per week. She reports that she does not use drugs. family history includes Allergic rhinitis in her mother; Breast cancer (age of onset: 81) in her mother; Colon cancer in her paternal grandfather and paternal grandmother; Colon polyps in her father; Coronary artery disease (age of onset: 52) in her maternal grandfather; Coronary artery disease (age of onset: 58) in her maternal grandmother; Dementia in her mother; Other (age of onset: 57) in her brother; Stroke (age of onset: 69) in her father; Thyroid  disease in her mother. Allergies  Allergen Reactions   Ibandronate     Other Reaction(s): stomach upset   Augmentin  [Amoxicillin -Pot Clavulanate] Other (See Comments)    GERD, upset stomach  Other Itching    Food sensitivities      Outpatient Encounter Medications as of 12/18/2023  Medication Sig   cetirizine (ZYRTEC) 5 MG tablet Take 5 mg by mouth daily.   Cholecalciferol (VITAMIN D3) 2000 units TABS Take 4,000 Units by mouth daily.    citalopram  (CELEXA ) 10 MG tablet Take by mouth.   denosumab  (PROLIA ) 60 MG/ML SOSY injection Inject 60 mg into the  skin every 6 (six) months.   EPINEPHrine  (EPIPEN  2-PAK) 0.3 mg/0.3 mL IJ SOAJ injection Inject 0.3 mg into the muscle as needed for anaphylaxis.   Estradiol  10 MCG TABS vaginal tablet Place one tablet (10 mcg) in the vagina twice weekly.   famotidine  (PEPCID ) 20 MG tablet Take 1 tablet (20 mg total) by mouth 2 (two) times daily.   ipratropium (ATROVENT ) 0.06 % nasal spray Place 2 sprays into both nostrils 3 (three) times daily as needed for rhinitis.   levothyroxine  (SYNTHROID ) 75 MCG tablet 1 tablet in the morning on an empty stomach Orally Once a day for 90 days   Multiple Vitamins-Minerals (OSTEOPRIME ULTRA) TABS as directed.    valACYclovir  (VALTREX ) 500 MG tablet Take 1 tablet po prn for flare ups Orally as directed for 30 days   No facility-administered encounter medications on file as of 12/18/2023.    REVIEW OF SYSTEMS  : All other systems reviewed and negative except where noted in the History of Present Illness.   PHYSICAL EXAM: BP 102/62 (BP Location: Right Arm, Patient Position: Sitting, Cuff Size: Normal)   Pulse (!) 50   Ht 5' 6 (1.676 m)   Wt 125 lb (56.7 kg)   LMP 06/05/2006 (Approximate)   BMI 20.18 kg/m  General: Well developed white female in no acute distress Head: Normocephalic and atraumatic Eyes:  Sclerae anicteric, conjunctiva pink. Ears: Normal auditory acuity Lungs: Clear throughout to auscultation; no W/R/R. Heart: Bradycardic but regular rhythm; no M/R/G. Abdomen: Soft, non-distended.  BS present.  Some epigastric TTP. Musculoskeletal: Symmetrical with no gross deformities  Skin: No lesions on visible extremities Extremities: No edema  Neurological: Alert oriented x 4, grossly non-focal Psychological:  Alert and cooperative. Normal mood and affect Assessment & Plan Gastroesophageal reflux disease (GERD) with food triggers GERD symptoms worsened with heartburn, acid reflux, mucus, gas, and pain when eating. Symptoms absent at night. Previous  medications exacerbated symptoms. Mucus likely due to postnasal drip. Famotidine  may alleviate reflux and allergy  symptoms. - Prescribed famotidine  20 mg twice daily, morning and dinnertime. - Discussed potential benefits of famotidine  for reflux and allergy  symptoms. - Scheduled endoscopy to evaluate for EOE and assess for reflux changes.  The risks, benefits, and alternatives to EGD were discussed with the patient and she consents to proceed.     CC:  Valentin Skates, DO

## 2023-12-20 DIAGNOSIS — J3089 Other allergic rhinitis: Secondary | ICD-10-CM | POA: Diagnosis not present

## 2023-12-20 NOTE — Progress Notes (Signed)
 VIAL MADE ON 12/20/23

## 2023-12-25 ENCOUNTER — Other Ambulatory Visit (HOSPITAL_COMMUNITY): Payer: Self-pay | Admitting: Pharmacy Technician

## 2023-12-25 ENCOUNTER — Ambulatory Visit

## 2023-12-25 ENCOUNTER — Encounter (HOSPITAL_COMMUNITY): Payer: Self-pay

## 2023-12-25 ENCOUNTER — Telehealth (HOSPITAL_COMMUNITY): Payer: Self-pay

## 2023-12-25 DIAGNOSIS — M810 Age-related osteoporosis without current pathological fracture: Secondary | ICD-10-CM | POA: Insufficient documentation

## 2023-12-25 DIAGNOSIS — J309 Allergic rhinitis, unspecified: Secondary | ICD-10-CM

## 2023-12-25 NOTE — Telephone Encounter (Signed)
 Auth Submission: NO AUTH NEEDED Site of care: Site of care: [facility] Payer: Medicare A/B, BCBS Supplement Medication & CPT/J Code(s) submitted: Prolia  (Denosumab ) [id number] Diagnosis Code: M81.0 Route of submission (phone, fax, portal):  Phone # Fax # Auth type: Buy/Bill HB Units/visits requested: 60mg  q6 months Reference number:  Approval from: 12/25/23 to 04/05/24

## 2023-12-26 ENCOUNTER — Ambulatory Visit: Admitting: Gastroenterology

## 2023-12-26 ENCOUNTER — Encounter: Payer: Self-pay | Admitting: Gastroenterology

## 2023-12-26 VITALS — BP 109/57 | HR 50 | Temp 97.2°F | Resp 11 | Ht 66.0 in | Wt 125.0 lb

## 2023-12-26 DIAGNOSIS — F419 Anxiety disorder, unspecified: Secondary | ICD-10-CM | POA: Diagnosis not present

## 2023-12-26 DIAGNOSIS — K219 Gastro-esophageal reflux disease without esophagitis: Secondary | ICD-10-CM

## 2023-12-26 DIAGNOSIS — K449 Diaphragmatic hernia without obstruction or gangrene: Secondary | ICD-10-CM | POA: Diagnosis not present

## 2023-12-26 DIAGNOSIS — F32A Depression, unspecified: Secondary | ICD-10-CM | POA: Diagnosis not present

## 2023-12-26 DIAGNOSIS — E039 Hypothyroidism, unspecified: Secondary | ICD-10-CM | POA: Diagnosis not present

## 2023-12-26 MED ORDER — SODIUM CHLORIDE 0.9 % IV SOLN: 500.0000 mL | Freq: Once | INTRAVENOUS | Status: DC

## 2023-12-26 MED ORDER — VOQUEZNA 20 MG PO TABS: 20.0000 mg | ORAL_TABLET | Freq: Every day | ORAL | 1 refills | Status: DC

## 2023-12-26 NOTE — Progress Notes (Unsigned)
 Sedate, gd SR, tolerated procedure well, VSS, report to RN

## 2023-12-26 NOTE — Op Note (Signed)
 [facility] Patient Name: Rachel Knight Procedure Date: 12/26/2023 9:15 AM MRN: [id number] Endoscopist: Gustav ALONSO Mcgee , MD, [id number] Age: 73 Referring MD:  Date of Birth: 1950-12-13 Gender: Female Account #: [id number] Procedure:                Upper GI endoscopy Indications:              Esophageal reflux symptoms that persist despite                            appropriate therapy, Failure to respond to medical                            treatment Medicines:                Monitored Anesthesia Care Procedure:                Pre-Anesthesia Assessment:                           - Prior to the procedure, a History and Physical                            was performed, and patient medications and                            allergies were reviewed. The patient's tolerance of                            previous anesthesia was also reviewed. The risks                            and benefits of the procedure and the sedation                            options and risks were discussed with the patient.                            All questions were answered, and informed consent                            was obtained. Prior Anticoagulants: The patient has                            taken no anticoagulant or antiplatelet agents. ASA                            Grade Assessment: II - A patient with mild systemic                            disease. After reviewing the risks and benefits,                            the patient was deemed in satisfactory condition to  undergo the procedure.                           After obtaining informed consent, the endoscope was                            passed under direct vision. Throughout the                            procedure, the patient's blood pressure, pulse, and                            oxygen saturations were monitored continuously. The                            Olympus scope [id number] was introduced  through the                            mouth, and advanced to the second part of duodenum.                            The upper GI endoscopy was accomplished without                            difficulty. The patient tolerated the procedure                            well. Scope In: Scope Out: Findings:                 The Z-line was regular and was found 36 cm from the                            incisors.                           A 3 cm hiatal hernia was present.                           The stomach was normal.                           The cardia and gastric fundus were normal on                            retroflexion.                           The examined duodenum was normal. Complications:            No immediate complications. Estimated Blood Loss:     Estimated blood loss was minimal. Impression:               - Z-line regular, 36 cm from the incisors.                           - 3 cm hiatal hernia.                           -  Normal stomach.                           - Normal examined duodenum.                           - No specimens collected. Recommendation:           - Resume previous diet.                           - Continue present medications.                           - Follow an antireflux regimen.                           - Use Pepcid  (famotidine ) 20 mg PO BID PRN.                           - Voquezna 20mg  daily X 30 days with 1 refill                           - Consider referral to [facility] for histal hernia repair                            and fundoplicaton                           - Follow up in GI office with APP in 1-2 months Gustav ALONSO Mcgee, MD 12/26/2023 9:33:29 AM This report has been signed electronically.

## 2023-12-26 NOTE — Progress Notes (Unsigned)
 Pt's states no medical or surgical changes since previsit or office visit.

## 2023-12-26 NOTE — Progress Notes (Unsigned)
 [facility] History and Physical   Primary Care Physician:  Valentin Skates, DO   Reason for Procedure:  Persistent GERD symptoms  Plan:    EGD  with possible interventions as needed     HPI: Rachel Knight is a very pleasant 73 y.o. female here for EGD for long standing persistent GERD symptoms.   The risks and benefits as well as alternatives of endoscopic procedure(s) have been discussed and reviewed. All questions answered. The patient agrees to proceed.    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   Vitamin D  deficiency     Past Surgical History:  Procedure Laterality Date   APPENDECTOMY  2011   AUGMENTATION MAMMAPLASTY     BREAST ENHANCEMENT SURGERY  1996   COLONOSCOPY  2000 , 2007   Dr Obie. Due 2017   NECK LIFT  11/2015   WISDOM TOOTH EXTRACTION      Prior to Admission medications   Medication Sig Start Date End Date Taking? Authorizing Provider  citalopram  (CELEXA ) 10 MG tablet Take by mouth. 07/06/22  Yes [provider]  Estradiol  10 MCG TABS vaginal tablet Place one tablet (10 mcg) in the vagina twice weekly. 12/10/23  Yes Cathlyn JAYSON Nikki Bobie FORBES, MD  famotidine  (PEPCID ) 20 MG tablet Take 1 tablet (20 mg total) by mouth 2 (two) times daily. 12/18/23  Yes Zehr, Jessica D, PA-C  ipratropium (ATROVENT ) 0.06 % nasal spray Place 2 sprays into both nostrils 3 (three) times daily as needed for rhinitis. 10/09/23  Yes Kozlow, Eric J, MD  levothyroxine  (SYNTHROID ) 75 MCG tablet 1 tablet in the morning on an empty stomach Orally Once a day for 90 days   Yes [provider]  Multiple Vitamins-Minerals (OSTEOPRIME ULTRA) TABS as directed.  03/28/17  Yes [provider]  cetirizine (ZYRTEC) 5 MG tablet Take 5 mg  by mouth daily.    [provider]  Cholecalciferol (VITAMIN D3) 2000 units TABS Take 4,000 Units by mouth daily.     [provider]  denosumab  (PROLIA ) 60 MG/ML SOSY injection Inject 60 mg into the skin every 6 (six) months.    [provider]  EPINEPHrine  (EPIPEN  2-PAK) 0.3 mg/0.3 mL IJ SOAJ injection Inject 0.3 mg into the muscle as needed for anaphylaxis. 04/10/23   Tobie Arleta SQUIBB, MD  valACYclovir  (VALTREX ) 500 MG tablet Take 1 tablet po prn for flare ups Orally as directed for 30 days 05/25/21   [provider]    Current Outpatient Medications  Medication Sig Dispense Refill   citalopram  (CELEXA ) 10 MG tablet Take by mouth.     Estradiol  10 MCG TABS vaginal tablet Place one tablet (10 mcg) in the vagina twice weekly. 24 tablet 3   famotidine  (PEPCID ) 20 MG tablet Take 1 tablet (20 mg total) by mouth 2 (two) times daily. 60 tablet 3   ipratropium (ATROVENT ) 0.06 % nasal spray Place 2 sprays into both nostrils 3 (three) times daily as needed for rhinitis. 45 mL 3   levothyroxine  (SYNTHROID ) 75 MCG tablet 1 tablet in the morning on an empty stomach Orally Once a day for 90 days     Multiple Vitamins-Minerals (OSTEOPRIME ULTRA) TABS as directed.  cetirizine (ZYRTEC) 5 MG tablet Take 5 mg by mouth daily.     Cholecalciferol (VITAMIN D3) 2000 units TABS Take 4,000 Units by mouth daily.      denosumab  (PROLIA ) 60 MG/ML SOSY injection Inject 60 mg into the skin every 6 (six) months.     EPINEPHrine  (EPIPEN  2-PAK) 0.3 mg/0.3 mL IJ SOAJ injection Inject 0.3 mg into the muscle as needed for anaphylaxis. 2 each 1   valACYclovir  (VALTREX ) 500 MG tablet Take 1 tablet po prn for flare ups Orally as directed for 30 days     Current Facility-Administered Medications  Medication Dose Route Frequency Provider Last Rate Last Admin   0.9 %  sodium chloride  infusion  500 mL Intravenous Once Talon Regala V, MD        Allergies as of 12/26/2023 - Review Complete  12/26/2023  Allergen Reaction Noted   Augmentin  [amoxicillin -pot clavulanate] Other (See Comments) 12/25/2018   Ibandronate Other (See Comments) 10/14/2021   Other Itching 02/05/2017    Family History  Problem Relation Age of Onset   Allergic rhinitis Mother    Breast cancer Mother 79   Thyroid  disease Mother    Dementia Mother    Stroke Father 29   Colon polyps Father    Other Brother 45       OS muscle paralysis , resolution over 3 days. Neg evaluation   Coronary artery disease Maternal Grandmother 63   Coronary artery disease Maternal Grandfather 60   Colon cancer Paternal Grandmother    Colon cancer Paternal Grandfather    Diabetes Neg Hx    Esophageal cancer Neg Hx    Rectal cancer Neg Hx    Stomach cancer Neg Hx     Social History   Socioeconomic History   Marital status: Married    Spouse name: Not on file   Number of children: 2   Years of education: Not on file   Highest education level: Not on file  Occupational History   Occupation: retired  Tobacco Use   Smoking status: Former    Types: Cigarettes   Smokeless tobacco: Never   Tobacco comments:    only smoked age [age] , 1 ppweek  Vaping Use   Vaping status: Never Used  Substance and Sexual Activity   Alcohol  use: Yes    Alcohol /week: 2.0 - 4.0 standard drinks of alcohol     Types: 2 - 4 Standard drinks or equivalent per week    Comment: occasional   Drug use: No   Sexual activity: Yes    Partners: Male    Birth control/protection: Post-menopausal    Comment: More than 5, after 18, std- HSV, no abnormal pap, no cancer, no DES  Other Topics Concern   Not on file  Social History Narrative   Exercise: weights, walking, pilates, swimming   Social Drivers of Health   Financial Resource Strain: Low Risk  (12/11/2018)   Overall Financial Resource Strain (CARDIA)    Difficulty of Paying Living Expenses: Not hard at all  Food Insecurity: No Food Insecurity (12/11/2018)   Hunger Vital Sign    Worried  About Running Out of Food in the Last Year: Never true    Ran Out of Food in the Last Year: Never true  Transportation Needs: No Transportation Needs (12/11/2018)   PRAPARE - Administrator, Civil Service (Medical): No    Lack of Transportation (Non-Medical): No  Physical Activity: Sufficiently Active (12/11/2018)   Exercise Vital Sign  Days of Exercise per Week: 5 days    Minutes of Exercise per Session: 60 min  Stress: No Stress Concern Present (12/11/2018)   [employer] of Occupational Health - Occupational Stress Questionnaire    Feeling of Stress : Not at all  Social Connections: Unknown (12/11/2018)   Social Connection and Isolation Panel    Frequency of Communication with Friends and Family: More than three times a week    Frequency of Social Gatherings with Friends and Family: More than three times a week    Attends Religious Services: Not on file    Active Member of Clubs or Organizations: Yes    Attends Banker Meetings: More than 4 times per year    Marital Status: Married  Catering manager Violence: Not At Risk (09/10/2023)   Received from [facility]   HITS    Over the last 12 months how often did your partner physically hurt you?: Never    Over the last 12 months how often did your partner insult you or talk down to you?: Never    Over the last 12 months how often did your partner threaten you with physical harm?: Never    Over the last 12 months how often did your partner scream or curse at you?: Never    Review of Systems:  All other review of systems negative except as mentioned in the HPI.  Physical Exam: Vital signs in last 24 hours: BP 108/66   Pulse (!) 57   Temp (!) 97.2 F (36.2 C)   Ht 5' 6 (1.676 m)   Wt 125 lb (56.7 kg)   LMP 06/05/2006 (Approximate)   SpO2 99%   BMI 20.18 kg/m  General:   Alert, NAD Lungs:  Clear .   Heart:  Regular rate and rhythm Abdomen:  Soft, nontender and nondistended. Neuro/Psych:  Alert  and cooperative. Normal mood and affect. A and O x 3  Reviewed labs, radiology imaging, old records and pertinent past GI work up  Patient is appropriate for planned procedure(s) and anesthesia in an ambulatory setting   K. Veena Mattheo Swindle , MD [phone]

## 2023-12-26 NOTE — Patient Instructions (Signed)
 YOU HAD AN ENDOSCOPIC PROCEDURE TODAY AT THE [facility]:   Refer to the procedure report that was given to you for any specific questions about what was found during the examination.  If the procedure report does not answer your questions, please call your gastroenterologist to clarify.  If you requested that your care partner not be given the details of your procedure findings, then the procedure report has been included in a sealed envelope for you to review at your convenience later.  YOU SHOULD EXPECT: Some feelings of bloating in the abdomen. Passage of more gas than usual.  Walking can help get rid of the air that was put into your GI tract during the procedure and reduce the bloating. If you had a lower endoscopy (such as a colonoscopy or flexible sigmoidoscopy) you may notice spotting of blood in your stool or on the toilet paper. If you underwent a bowel prep for your procedure, you may not have a normal bowel movement for a few days.  Please Note:  You might notice some irritation and congestion in your nose or some drainage.  This is from the oxygen used during your procedure.  There is no need for concern and it should clear up in a day or so.  SYMPTOMS TO REPORT IMMEDIATELY:   Following upper endoscopy (EGD)  Vomiting of blood or coffee ground material  New chest pain or pain under the shoulder blades  Painful or persistently difficult swallowing  New shortness of breath  Fever of 100F or higher  Black, tarry-looking stools  Resume previous diet Continue present medications  Follow an antireflux regimen Use Pepcid  (famotidine ) - 20 mg by mouth twice a day as needed Voquenza - 20 mg daily for 30 days with 1 refill Consider referral for hiatal hernia repair and fundoplication Gollow up in GI office in 1-2 months Handout on hiatal hernia given    For urgent or emergent issues, a gastroenterologist can be reached at any hour by calling [phone]. Do not use  MyChart messaging for urgent concerns.    DIET:  We do recommend a small meal at first, but then you may proceed to your regular diet.  Drink plenty of fluids but you should avoid alcoholic beverages for 24 hours.  ACTIVITY:  You should plan to take it easy for the rest of today and you should NOT DRIVE or use heavy machinery until tomorrow (because of the sedation medicines used during the test).    FOLLOW UP: Our staff will call the number listed on your records the next business day following your procedure.  We will call around 7:15- 8:00 am to check on you and address any questions or concerns that you may have regarding the information given to you following your procedure. If we do not reach you, we will leave a message.     If any biopsies were taken you will be contacted by phone or by letter within the next 1-3 weeks.  Please call us  at [phone] if you have not heard about the biopsies in 3 weeks.    SIGNATURES/CONFIDENTIALITY: You and/or your care partner have signed paperwork which will be entered into your electronic medical record.  These signatures attest to the fact that that the information above on your After Visit Summary has been reviewed and is understood.  Full responsibility of the confidentiality of this discharge information lies with you and/or your care-partner.

## 2023-12-27 ENCOUNTER — Telehealth: Payer: Self-pay

## 2023-12-27 NOTE — Telephone Encounter (Signed)
  Follow up Call-     12/26/2023    8:09 AM 04/19/2023    8:57 AM  Call back number  Post procedure Call Back phone  # [phone]  Permission to leave phone message Yes Yes     Patient questions:  Do you have a fever, pain , or abdominal swelling? No. Pain Score  0 *  Have you tolerated food without any problems? Yes.    Have you been able to return to your normal activities? Yes.    Do you have any questions about your discharge instructions: Diet   No. Medications  No. Follow up visit  No.  Do you have questions or concerns about your Care? No.  Actions: * If pain score is 4 or above: No action needed, pain <4.

## 2023-12-31 ENCOUNTER — Encounter: Payer: Self-pay | Admitting: Gastroenterology

## 2024-01-03 ENCOUNTER — Other Ambulatory Visit (HOSPITAL_COMMUNITY): Payer: Self-pay

## 2024-01-03 ENCOUNTER — Ambulatory Visit (INDEPENDENT_AMBULATORY_CARE_PROVIDER_SITE_OTHER)

## 2024-01-03 ENCOUNTER — Encounter (HOSPITAL_COMMUNITY): Payer: Self-pay | Admitting: Internal Medicine

## 2024-01-03 ENCOUNTER — Telehealth: Payer: Self-pay

## 2024-01-03 DIAGNOSIS — J309 Allergic rhinitis, unspecified: Secondary | ICD-10-CM

## 2024-01-03 NOTE — Telephone Encounter (Signed)
 Pharmacy Patient Advocate Encounter  Received notification from [employer] that Prior Authorization for Voquezna 20MG  tablets  has been APPROVED from 01-03-2024 to 03-05-2098. Ran test claim, Copay is $219.65. This test claim was processed through [facility]- copay amounts may vary at other pharmacies due to pharmacy/plan contracts, or as the patient moves through the different stages of their insurance plan.   PA #/Case ID/Reference #: [id number]

## 2024-01-03 NOTE — Telephone Encounter (Signed)
 Pharmacy Patient Advocate Encounter   Received notification from Patient Pharmacy that prior authorization for Voquezna 20MG  tablets is required/requested.   Insurance verification completed.   The patient is insured through [facility].   Per test claim: PA required; PA submitted to above mentioned insurance via Latent Key/confirmation #/EOC [id number] Status is pending

## 2024-01-03 NOTE — Telephone Encounter (Signed)
 Noted pt. advised

## 2024-01-10 ENCOUNTER — Ambulatory Visit

## 2024-01-10 DIAGNOSIS — J309 Allergic rhinitis, unspecified: Secondary | ICD-10-CM | POA: Diagnosis not present

## 2024-01-14 ENCOUNTER — Other Ambulatory Visit: Payer: Self-pay | Admitting: Medical Genetics

## 2024-01-14 DIAGNOSIS — Z006 Encounter for examination for normal comparison and control in clinical research program: Secondary | ICD-10-CM

## 2024-01-15 ENCOUNTER — Other Ambulatory Visit (HOSPITAL_COMMUNITY): Payer: Self-pay

## 2024-01-18 DIAGNOSIS — Z23 Encounter for immunization: Secondary | ICD-10-CM | POA: Diagnosis not present

## 2024-01-23 ENCOUNTER — Other Ambulatory Visit: Payer: Self-pay

## 2024-01-23 MED ORDER — VOQUEZNA 20 MG PO TABS: 20.0000 mg | ORAL_TABLET | Freq: Every day | ORAL | 0 refills | Status: AC

## 2024-02-05 ENCOUNTER — Ambulatory Visit (HOSPITAL_COMMUNITY)
Admission: RE | Admit: 2024-02-05 | Discharge: 2024-02-05 | Disposition: A | Discharge status: Home / Self Care | Source: Ambulatory Visit | Attending: Internal Medicine | Admitting: Internal Medicine

## 2024-02-05 VITALS — BP 118/44 | HR 61 | Temp 97.8°F | Resp 16

## 2024-02-05 DIAGNOSIS — M810 Age-related osteoporosis without current pathological fracture: Secondary | ICD-10-CM | POA: Diagnosis present

## 2024-02-05 DIAGNOSIS — L820 Inflamed seborrheic keratosis: Secondary | ICD-10-CM | POA: Diagnosis not present

## 2024-02-05 MED ORDER — DENOSUMAB 60 MG/ML ~~LOC~~ SOSY
PREFILLED_SYRINGE | SUBCUTANEOUS | Status: AC
  Filled 2024-02-05: qty 1

## 2024-02-05 MED ORDER — DENOSUMAB 60 MG/ML ~~LOC~~ SOSY
60.0000 mg | PREFILLED_SYRINGE | Freq: Once | SUBCUTANEOUS | Status: AC
  Administered 2024-02-05: 60 mg via SUBCUTANEOUS

## 2024-02-07 ENCOUNTER — Ambulatory Visit: Admitting: Gastroenterology

## 2024-02-08 ENCOUNTER — Ambulatory Visit

## 2024-02-08 DIAGNOSIS — J302 Other seasonal allergic rhinitis: Secondary | ICD-10-CM

## 2024-02-08 DIAGNOSIS — J309 Allergic rhinitis, unspecified: Secondary | ICD-10-CM

## 2024-02-11 DIAGNOSIS — H2513 Age-related nuclear cataract, bilateral: Secondary | ICD-10-CM | POA: Diagnosis not present

## 2024-02-11 DIAGNOSIS — H524 Presbyopia: Secondary | ICD-10-CM | POA: Diagnosis not present

## 2024-02-11 DIAGNOSIS — H04123 Dry eye syndrome of bilateral lacrimal glands: Secondary | ICD-10-CM | POA: Diagnosis not present

## 2024-03-13 ENCOUNTER — Ambulatory Visit (INDEPENDENT_AMBULATORY_CARE_PROVIDER_SITE_OTHER)

## 2024-03-13 DIAGNOSIS — J302 Other seasonal allergic rhinitis: Secondary | ICD-10-CM

## 2024-03-25 ENCOUNTER — Other Ambulatory Visit (INDEPENDENT_AMBULATORY_CARE_PROVIDER_SITE_OTHER)

## 2024-03-25 ENCOUNTER — Encounter: Payer: Self-pay | Admitting: Gastroenterology

## 2024-03-25 ENCOUNTER — Ambulatory Visit: Admitting: Gastroenterology

## 2024-03-25 VITALS — BP 98/60 | HR 76 | Ht 66.0 in | Wt 126.0 lb

## 2024-03-25 DIAGNOSIS — K219 Gastro-esophageal reflux disease without esophagitis: Secondary | ICD-10-CM

## 2024-03-25 DIAGNOSIS — K449 Diaphragmatic hernia without obstruction or gangrene: Secondary | ICD-10-CM

## 2024-03-25 DIAGNOSIS — K9049 Malabsorption due to intolerance, not elsewhere classified: Secondary | ICD-10-CM | POA: Diagnosis not present

## 2024-03-25 DIAGNOSIS — R143 Flatulence: Secondary | ICD-10-CM | POA: Diagnosis not present

## 2024-03-25 NOTE — Progress Notes (Signed)
 "    03/25/2024 Rachel Knight [id number] 11-Nov-1950   Discussed the use of AI scribe software for clinical note transcription with the patient, who gave verbal consent to proceed.  History of Present Illness Rachel Knight is a 73 year old female with hiatal hernia and gastroesophageal reflux who presents for follow-up of gastrointestinal symptoms and concern for food intolerance.  She is a patient of Dr. Trenna.  Follow-up after recent endoscopy revealed a hiatal hernia. Famotidine  was ineffective, and she transitioned to Voquezna , initially combined with famotidine  but now used as monotherapy. Voquezna  is considered expensive, and she prefers dietary regulation over medication. No surgical consultation has been pursued.  During the holiday season, gastrointestinal symptoms worsened, attributed to dietary indiscretions and increased alcohol  intake. Abstinence from alcohol  for the past three weeks has led to significant improvement. She currently avoids foods that are poorly tolerated, including most dairy products, and substitutes almond milk and some butter. Kimchi and increased B vitamin intake are being trialed. Throat clearing and mucus production persist, with unclear etiology. Symptoms improve with dietary modifications and avoidance of trigger foods. No significant regurgitation or severe reflux is present.  History of environmental allergies to dust mites, managed with allergy  shots. Previous food allergy  testing was unremarkable. Food sensitivity blood testing was performed 8-9 years ago, but care was discontinued. She avoids dairy and is considering the impact of wheat and other foods. No prior celiac disease testing; interested in ruling this out. Lactose intolerance and gluten sensitivity are considered possible contributors. No known severe food allergies or symptoms consistent with true food allergy  reactions.  Gastrointestinal gas and discomfort occur, particularly  with certain foods. She is interested in dietary approaches such as the low FODMAP diet. Chocolate and fried foods are avoided, and she is mindful of gluten and dairy intake. No recent tick bites or symptoms suggestive of alpha-gal syndrome. Symptoms are influenced by stress and sleep patterns, with worsening during periods of poor diet and high stress.  Currently feels well, with significant improvement since resuming a cleaner diet and abstaining from alcohol .   EGD 12/2023: - Z- line regular, 36 cm from the incisors. - 3 cm hiatal hernia. - Normal stomach. - Normal examined duodenum. - No specimens collected.   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   Vitamin D  deficiency    Past Surgical History:  Procedure Laterality Date   APPENDECTOMY  2011   AUGMENTATION MAMMAPLASTY     BREAST ENHANCEMENT SURGERY  1996   COLONOSCOPY  2000 , 2007   Dr Obie. Due 2017   NECK LIFT  11/2015   WISDOM TOOTH EXTRACTION      reports that she has quit smoking. Her smoking use included cigarettes. She has never used smokeless tobacco. She reports current alcohol  use of about 2.0 - 4.0 standard drinks of alcohol  per week. She reports that she does not use drugs. family history includes Allergic rhinitis in her mother; Breast cancer (age of onset: 72) in her mother; Colon cancer in her paternal grandfather and paternal grandmother; Colon polyps in her father; Coronary artery disease (age of onset: 29) in her maternal grandfather; Coronary artery disease (age of onset: 30) in her maternal  grandmother; Dementia in her mother; Other (age of onset: 13) in her brother; Stroke (age of onset: 54) in her father; Thyroid  disease in her mother. Allergies[1]    Outpatient Encounter Medications  as of 03/25/2024  Medication Sig   Cholecalciferol (VITAMIN D3) 2000 units TABS Take 4,000 Units by mouth daily.    citalopram  (CELEXA ) 10 MG tablet Take by mouth.   denosumab  (PROLIA ) 60 MG/ML SOSY injection Inject 60 mg into the skin every 6 (six) months.   EPINEPHrine  (EPIPEN  2-PAK) 0.3 mg/0.3 mL IJ SOAJ injection Inject 0.3 mg into the muscle as needed for anaphylaxis.   Estradiol  10 MCG TABS vaginal tablet Place one tablet (10 mcg) in the vagina twice weekly. (Patient taking differently: Place 1 tablet vaginally once a week. Place one tablet (10 mcg) in the vagina twice weekly.)   ipratropium (ATROVENT ) 0.06 % nasal spray Place 2 sprays into both nostrils 3 (three) times daily as needed for rhinitis.   levothyroxine  (SYNTHROID ) 75 MCG tablet 1 tablet in the morning on an empty stomach Orally Once a day for 90 days   Multiple Vitamins-Minerals (OSTEOPRIME ULTRA) TABS as directed.    valACYclovir  (VALTREX ) 500 MG tablet Take 1 tablet po prn for flare ups Orally as directed for 30 days   Vonoprazan Fumarate  (VOQUEZNA ) 10 MG TABS Take 1 tablet by mouth as needed (Per patient).   famotidine  (PEPCID ) 20 MG tablet Take 1 tablet (20 mg total) by mouth 2 (two) times daily. (Patient not taking: Reported on 03/25/2024)   [DISCONTINUED] cetirizine (ZYRTEC) 5 MG tablet Take 5 mg by mouth daily.   No facility-administered encounter medications on file as of 03/25/2024.     REVIEW OF SYSTEMS  : All other systems reviewed and negative except where noted in the History of Present Illness.   PHYSICAL EXAM: BP 98/60   Pulse 76   Ht 5' 6 (1.676 m)   Wt 126 lb (57.2 kg)   LMP 06/05/2006   BMI 20.34 kg/m  General: Well developed white female in no acute distress Head: Normocephalic and atraumatic Eyes:  Sclerae anicteric, conjunctiva pink. Ears: Normal auditory acuity Lungs: Clear throughout to auscultation; no W/R/R. Heart: Regular rate and rhythm; no M/R/G. Abdomen: Soft, non-distended.  BS  present.  Non-tender. Musculoskeletal: Symmetrical with no gross deformities  Skin: No lesions on visible extremities Extremities: No edema  Neurological: Alert oriented x 4, grossly non-focal Psychological:  Alert and cooperative. Normal mood and affect  Assessment & Plan Hiatal hernia with gastroesophageal reflux disease Chronic, small to medium hiatal hernia with associated gastroesophageal reflux disease, currently well controlled with dietary modifications and intermittent Voquezna . Symptoms are not significantly impacting quality of life, and she prefers to minimize medication and avoid surgical intervention. - Advised continuation of dietary modifications to manage reflux, including avoidance of alcohol , tomato-based products, citrus, and fried foods. - Recommended Voquezna  as needed, monotherapy without famotidine . - Discussed surgical options for hiatal hernia repair; deferred referral to surgery per her preference.  Evaluation for celiac disease Ongoing gastrointestinal symptoms without classic severe features; celiac disease considered due to its variable and sometimes mild or atypical presentation. - Ordered celiac serology to rule out celiac disease.  Gastrointestinal symptoms due to food intolerance Chronic gastrointestinal symptoms (gas, mucus, throat clearing) possibly related to food intolerance. Prior food allergy  testing was negative; she prefers non-pharmacologic management with dietary elimination strategies. - Provided printed information on the low FODMAP diet and discussed its use for identifying food triggers. - Advised elimination of  high FODMAP foods for 2-3 weeks, followed by gradual reintroduction to identify sensitivities. - Discussed limited utility of food sensitivity and allergy  testing for gastrointestinal symptoms; recommended dietary trial and error. - Encouraged continuation of current dietary strategies and to update the clinic with any significant changes  in symptoms or new findings.  Follow-up prn for now.    CC:  Valentin Skates, DO       [1]  Allergies Allergen Reactions   Augmentin  [Amoxicillin -Pot Clavulanate] Other (See Comments)    GERD, upset stomach   Ibandronate Other (See Comments)    Other Reaction(s): stomach upset   Other Itching    Food sensitivities   "

## 2024-03-25 NOTE — Patient Instructions (Signed)
 Your provider has requested that you go to the basement level for lab work before leaving today. Press B on the elevator. The lab is located at the first door on the left as you exit the elevator.   Low Fodmap diet information provided.   _______________________________________________________  If your blood pressure at your visit was 140/90 or greater, please contact your primary care physician to follow up on this.  _______________________________________________________  If you are age 72 or older, your body mass index should be between 23-30. Your Body mass index is 20.34 kg/m. If this is out of the aforementioned range listed, please consider follow up with your Primary Care Provider.  If you are age 65 or younger, your body mass index should be between 19-25. Your Body mass index is 20.34 kg/m. If this is out of the aformentioned range listed, please consider follow up with your Primary Care Provider.   ________________________________________________________  The [facility] providers would like to encourage you to use MYCHART to communicate with providers for non-urgent requests or questions.  Due to long hold times on the telephone, sending your provider a message by [facility] may be a faster and more efficient way to get a response.  Please allow 48 business hours for a response.  Please remember that this is for non-urgent requests.  _______________________________________________________  [facility] is using a team-based approach to care.  Your team is made up of your doctor and two to three APPS. Our APPS (Nurse Practitioners and Physician Assistants) work with your physician to ensure care continuity for you. They are fully qualified to address your health concerns and develop a treatment plan. They communicate directly with your gastroenterologist to care for you. Seeing the Advanced Practice Practitioners on your physician's team can help you by facilitating care  more promptly, often allowing for earlier appointments, access to diagnostic testing, procedures, and other specialty referrals.

## 2024-03-26 LAB — TISSUE TRANSGLUTAMINASE ABS,IGG,IGA
(tTG) Ab, IgA: <1 U/mL
(tTG) Ab, IgG: <1 U/mL

## 2024-03-26 LAB — IGA: Immunoglobulin A: 166 mg/dL (ref 70–320)

## 2024-03-27 ENCOUNTER — Ambulatory Visit: Payer: Self-pay | Admitting: Gastroenterology

## 2024-04-07 ENCOUNTER — Ambulatory Visit: Admitting: Family

## 2024-04-11 ENCOUNTER — Ambulatory Visit: Admitting: *Deleted

## 2024-04-11 DIAGNOSIS — J302 Other seasonal allergic rhinitis: Secondary | ICD-10-CM

## 2024-04-30 ENCOUNTER — Ambulatory Visit: Admitting: Family

## 2024-08-06 ENCOUNTER — Encounter (HOSPITAL_COMMUNITY)

## 2024-12-16 ENCOUNTER — Encounter: Admitting: Obstetrics and Gynecology
